# Patient Record
Sex: Male | Born: 1935 | Race: White | Hispanic: No | Marital: Married | Smoking: Former smoker
Health system: Southern US, Community
[De-identification: ages and names within clinical notes are randomized; demographics above are authoritative.]

## PROBLEM LIST (undated history)

## (undated) ENCOUNTER — Emergency Department (HOSPITAL_COMMUNITY): Disposition: A | Discharge status: Other Acute Inpatient Hospital | Payer: Self-pay

## (undated) DIAGNOSIS — I214 Non-ST elevation (NSTEMI) myocardial infarction: Secondary | ICD-10-CM

## (undated) DIAGNOSIS — I2510 Atherosclerotic heart disease of native coronary artery without angina pectoris: Secondary | ICD-10-CM

## (undated) DIAGNOSIS — I209 Angina pectoris, unspecified: Secondary | ICD-10-CM

## (undated) DIAGNOSIS — I10 Essential (primary) hypertension: Secondary | ICD-10-CM

## (undated) DIAGNOSIS — Z95820 Peripheral vascular angioplasty status with implants and grafts: Secondary | ICD-10-CM

## (undated) DIAGNOSIS — E785 Hyperlipidemia, unspecified: Secondary | ICD-10-CM

## (undated) DIAGNOSIS — K219 Gastro-esophageal reflux disease without esophagitis: Secondary | ICD-10-CM

## (undated) DIAGNOSIS — E119 Type 2 diabetes mellitus without complications: Secondary | ICD-10-CM

## (undated) DIAGNOSIS — Z8719 Personal history of other diseases of the digestive system: Secondary | ICD-10-CM

## (undated) DIAGNOSIS — M1990 Unspecified osteoarthritis, unspecified site: Secondary | ICD-10-CM

## (undated) HISTORY — PX: TONSILLECTOMY AND ADENOIDECTOMY: SUR1326

## (undated) HISTORY — PX: CERVICAL DISC SURGERY: SHX588

---

## 1989-04-04 HISTORY — PX: CERVICAL DISC SURGERY: SHX588

## 1997-10-02 HISTORY — PX: CORONARY ARTERY BYPASS GRAFT: SHX141

## 2001-01-01 ENCOUNTER — Encounter (INDEPENDENT_AMBULATORY_CARE_PROVIDER_SITE_OTHER): Payer: Self-pay | Admitting: Specialist

## 2001-01-01 ENCOUNTER — Other Ambulatory Visit: Admission: RE | Admit: 2001-01-01 | Discharge: 2001-01-01 | Payer: Self-pay | Admitting: Gastroenterology

## 2001-03-29 ENCOUNTER — Inpatient Hospital Stay (HOSPITAL_COMMUNITY): Admission: EM | Admit: 2001-03-29 | Discharge: 2001-03-31 | Payer: Self-pay | Admitting: Emergency Medicine

## 2001-03-29 ENCOUNTER — Encounter: Payer: Self-pay | Admitting: Emergency Medicine

## 2001-03-30 ENCOUNTER — Encounter: Payer: Self-pay | Admitting: Cardiovascular Disease

## 2007-03-25 DIAGNOSIS — I2510 Atherosclerotic heart disease of native coronary artery without angina pectoris: Secondary | ICD-10-CM

## 2007-03-25 HISTORY — DX: Atherosclerotic heart disease of native coronary artery without angina pectoris: I25.10

## 2009-04-18 ENCOUNTER — Encounter (INDEPENDENT_AMBULATORY_CARE_PROVIDER_SITE_OTHER): Payer: Self-pay | Admitting: *Deleted

## 2009-12-21 ENCOUNTER — Telehealth: Payer: Self-pay | Admitting: Gastroenterology

## 2009-12-25 ENCOUNTER — Encounter: Payer: Self-pay | Admitting: Gastroenterology

## 2010-01-17 ENCOUNTER — Encounter (INDEPENDENT_AMBULATORY_CARE_PROVIDER_SITE_OTHER): Payer: Self-pay | Admitting: *Deleted

## 2010-01-21 ENCOUNTER — Ambulatory Visit: Payer: Self-pay | Admitting: Gastroenterology

## 2010-01-30 ENCOUNTER — Ambulatory Visit: Payer: Self-pay | Admitting: Gastroenterology

## 2010-02-04 ENCOUNTER — Encounter: Payer: Self-pay | Admitting: Gastroenterology

## 2010-09-05 NOTE — Letter (Signed)
Summary: [facility] Instructions  [facility]  [address] [place], [place] [address]   Phone: [phone]  Fax: [phone]       Daryl Joseph    [birth year]    MRN: [id number]        Procedure Day Dorna Bloom:  Wednesday 01/30/2010     Arrival Time: 10:30 am      Procedure Time: 11:30 am     Location of Procedure:                    _x _  [facility] (4th Floor)                        PREPARATION FOR COLONOSCOPY WITH MOVIPREP   Starting 5 days prior to your procedure Friday 6/24 do not eat nuts, seeds, popcorn, corn, beans, peas,  salads, or any raw vegetables.  Do not take any fiber supplements (e.g. Metamucil, Citrucel, and Benefiber).  THE DAY BEFORE YOUR PROCEDURE         DATE: Tuesday 6/28  1.  Drink clear liquids the entire day-NO SOLID FOOD  2.  Do not drink anything colored red or purple.  Avoid juices with pulp.  No orange juice.  3.  Drink at least 64 oz. (8 glasses) of fluid/clear liquids during the day to prevent dehydration and help the prep work efficiently.  CLEAR LIQUIDS INCLUDE: Water Jello Ice Popsicles Tea (sugar ok, no milk/cream) Powdered fruit flavored drinks Coffee (sugar ok, no milk/cream) Gatorade Juice: apple, white grape, white cranberry  Lemonade Clear bullion, consomm, broth Carbonated beverages (any kind) Strained chicken noodle soup Hard Candy                             4.  In the morning, mix first dose of MoviPrep solution:    Empty 1 Pouch A and 1 Pouch B into the disposable container    Add lukewarm drinking water to the top line of the container. Mix to dissolve    Refrigerate (mixed solution should be used within 24 hrs)  5.  Begin drinking the prep at 5:00 p.m. The MoviPrep container is divided by 4 marks.   Every 15 minutes drink the solution down to the next mark (approximately 8 oz) until the full liter is complete.   6.  Follow completed prep with 16 oz of clear liquid of your choice (Nothing  red or purple).  Continue to drink clear liquids until bedtime.  7.  Before going to bed, mix second dose of MoviPrep solution:    Empty 1 Pouch A and 1 Pouch B into the disposable container    Add lukewarm drinking water to the top line of the container. Mix to dissolve    Refrigerate  THE DAY OF YOUR PROCEDURE      DATE: Wednesday 6/29  Beginning at 6:30 am (5 hours before procedure):         1. Every 15 minutes, drink the solution down to the next mark (approx 8 oz) until the full liter is complete.  2. Follow completed prep with 16 oz. of clear liquid of your choice.    3. You may drink clear liquids until 9:30 am(2 HOURS BEFORE PROCEDURE).   MEDICATION INSTRUCTIONS  Unless otherwise instructed, you should take regular prescription medications with a small sip of water   as early as possible the morning of your  procedure.  Hold your metformin the am of your procedure.         OTHER INSTRUCTIONS  You will need a responsible adult at least 75 years of age to accompany you and drive you home.   This person must remain in the waiting room during your procedure.  Wear loose fitting clothing that is easily removed.  Leave jewelry and other valuables at home.  However, you may wish to bring a book to read or  an iPod/MP3 player to listen to music as you wait for your procedure to start.  Remove all body piercing jewelry and leave at home.  Total time from sign-in until discharge is approximately 2-3 hours.  You should go home directly after your procedure and rest.  You can resume normal activities the  day after your procedure.  The day of your procedure you should not:   Drive   Make legal decisions   Operate machinery   Drink alcohol   Return to work  You will receive specific instructions about eating, activities and medications before you leave.    The above instructions have been reviewed and explained to me by   Clide Cliff,  RN_____________________    I fully understand and can verbalize these instructions _____________________________ Date _________

## 2010-09-05 NOTE — Progress Notes (Signed)
Summary: Schedule Colonoscopy  Phone Note Outgoing Call   Call placed by: Lamona Curl CMA Duncan Dull),  Dec 21, 2009 1:32 PM Call placed to: Patient Summary of Call: Patient is overdue for colonoscopy. His last colonoscopy in 05/2004 showed internal hemorrhoids, diverticulosis and adenomatous colonic polyps. I have spoken to patient's wife who has scheduled the patient for a previsit on 6/20 @ 9 am and for colonoscopy on 01/30/10 @ 11:30 am in [facility]. I have confirmed that the patient is not on any anticoagulants nor is he on any insulin. Initial call taken by: Lamona Curl CMA (AAMA),  Dec 21, 2009 1:40 PM

## 2010-09-05 NOTE — Letter (Signed)
Summary: Previsit letter  [facility]  [address] [place], [place] [address]   Phone: [phone]  Fax: [phone]       12/25/2009 MRN: [id number]  [facility] [place] [address] [place] [address] [place]  [address]  [place]  Dear Mr. Daryl Joseph,  Welcome to the Gastroenterology Division at [facility].    You are scheduled to see a nurse for your pre-procedure visit on 01/21/10 at 9:00 am on the 3rd floor at [facility], [address]  We ask that you try to arrive at our office 15 minutes prior to your appointment time to allow for check-in.  Your nurse visit will consist of discussing your medical and surgical history, your immediate family medical history, and your medications.    Please bring a complete list of all your medications or, if you prefer, bring the medication bottles and we will list them.  We will need to be aware of both prescribed and over the counter drugs.  We will need to know exact dosage information as well.  If you are on blood thinners (Coumadin, Plavix, Aggrenox, Ticlid, etc.) please call our office today/prior to your appointment, as we need to consult with your physician about holding your medication.   Please be prepared to read and sign documents such as consent forms, a financial agreement, and acknowledgement forms.  If necessary, and with your consent, a friend or relative is welcome to sit-in on the nurse visit with you.  Please bring your insurance card so that we may make a copy of it.  If your insurance requires a referral to see a specialist, please bring your referral form from your primary care physician.  No co-pay is required for this nurse visit.     If you cannot keep your appointment, please call [phone] to cancel or reschedule prior to your appointment date.  This allows [place] the opportunity to schedule an appointment for another patient in need of care.    Thank you for choosing [facility] for your medical needs.  We  appreciate the opportunity to care for you.  Please visit [place] at our website  to learn more about our practice.                     Sincerely.                                                                                                                   The Gastroenterology Division

## 2010-09-05 NOTE — Letter (Signed)
Summary: Patient Notice- Polyp Results  [facility]  [address] [place], [place] [address]   Phone: [phone]  Fax: [phone]        February 04, 2010 MRN: [id number]    [facility] [address] [place] [address] [place]  [address]    Dear Mr. Ganser,  I am pleased to inform you that the colon polyp(s) removed during your recent colonoscopy was (were) found to be benign (no cancer detected) upon pathologic examination.  I recommend you have a repeat colonoscopy examination in 5 years to look for recurrent polyps, as having colon polyps increases your risk for having recurrent polyps or even colon cancer in the future.  Should you develop new or worsening symptoms of abdominal pain, bowel habit changes or bleeding from the rectum or bowels, please schedule an evaluation with either your primary care physician or with me.  Continue treatment plan as outlined the day of your exam.  Please call us if you are having persistent problems or have questions about your condition that have not been fully answered at this time.  Sincerely,  Meryl Dare MD [facility]  This letter has been electronically signed by your physician.  Appended Document: Patient Notice- Polyp Results letter mailed

## 2010-09-05 NOTE — Procedures (Signed)
Summary: Colonoscopy  Patient: Daryl Joseph Note: All result statuses are Final unless otherwise noted.  Tests: (1) Colonoscopy (COL)   COL Colonoscopy           DONE     [facility]     [address]     [place], [place]  [address]           COLONOSCOPY PROCEDURE REPORT           PATIENT:  [name], [name]  MR#:  [id number]     BIRTHDATE:  04/27/1933, 76 yrs. old  GENDER:  male     ENDOSCOPIST:  Judie Petit T. Russella Dar, MD, [facility]           PROCEDURE DATE:  01/30/2010     PROCEDURE:  Colonoscopy with snare polypectomy     ASA CLASS:  Class II     INDICATIONS:  1) follow-up of polyp  2) surveillance and high-risk     screening, adenomatous polyp, 06/1995.     MEDICATIONS:   Fentanyl 50 mcg IV, Versed 4 mg IV     DESCRIPTION OF PROCEDURE:   After the risks benefits and     alternatives of the procedure were thoroughly explained, informed     consent was obtained.  Digital rectal exam was performed and     revealed no abnormalities.   The LB PCF-[id number] endoscope     was introduced through the anus and advanced to the cecum, which     was identified by both the appendix and ileocecal valve, without     limitations.  The quality of the prep was adequate, using     MoviPrep.  The instrument was then slowly withdrawn as the colon     was fully examined.     <<PROCEDUREIMAGES>>     FINDINGS:  Moderate diverticulosis was found ascending colon to     sigmoid colon.  A sessile polyp was found in the ascending colon.     It was 5 mm in size. Polyp was snared without cautery. Retrieval     was successful. This was otherwise a normal examination of the     colon. Retroflexed views in the rectum revealed internal     hemorrhoids, small. The time to cecum =  2  minutes. The scope was     then withdrawn (time =  9  min) from the patient and the procedure     completed.     COMPLICATIONS:  None           ENDOSCOPIC IMPRESSION:     1) Moderate diverticulosis ascending colon to sigmoid  colon     2) 5 mm sessile polyp in the ascending colon     3) Internal hemorrhoids     RECOMMENDATIONS:     1) Await pathology results     2) High fiber diet with liberal fluid intake.     3) Repeat Colonoscopy in 5 years pending pathology review           Steffen Hase T. Russella Dar, MD, [facility]           n.     eSIGNED:   Venita Lick. Jabin Tapp at 01/30/2010 12:05 PM           [place], [place], [id number]  Note: An exclamation mark (!) indicates a result that was not dispersed into the flowsheet. Document Creation Date: 01/30/2010 12:05 PM _______________________________________________________________________  (1) Order result status: Final Collection or observation date-time: 01/30/2010 12:00 Requested  date-time:  Receipt date-time:  Reported date-time:  Referring Physician:   Ordering Physician: Claudette Head [id number]) Specimen Source:  Source: Launa Grill Order Number: [id number] Lab site:   Appended Document: Colonoscopy     Procedures Next Due Date:    Colonoscopy: 01/2015

## 2010-09-05 NOTE — Miscellaneous (Signed)
Summary: recall colonoscopy/dn  Clinical Lists Changes  Medications: Added new medication of MOVIPREP 100 GM  SOLR (PEG-KCL-NACL-NASULF-NA ASC-C) As directed - Signed Rx of MOVIPREP 100 GM  SOLR (PEG-KCL-NACL-NASULF-NA ASC-C) As directed;  #1 x 0;  Signed;  Entered by: Clide Cliff RN;  Authorized by: Meryl Dare MD [facility];  Method used: Electronically to [facility] [phone]*, [address] [place], [place]  [address], Ph: [phone], Fax: [phone] Observations: Added new observation of ALLERGY REV: Done (01/21/2010 8:39)    Prescriptions: MOVIPREP 100 GM  SOLR (PEG-KCL-NACL-NASULF-NA ASC-C) As directed  #1 x 0   Entered by:   Clide Cliff RN   Authorized by:   Meryl Dare MD [facility]   Signed by:   Clide Cliff RN on 01/21/2010   Method used:   Electronically to        [facility] [phone]* (retail)       [address]       [place], [place]  [address]       Ph: [phone]       Fax: [phone]   RxID:   [id number]

## 2010-10-20 LAB — GLUCOSE, CAPILLARY
Glucose-Capillary: 125 mg/dL — ABNORMAL HIGH (ref 70–99)
Glucose-Capillary: 162 mg/dL — ABNORMAL HIGH (ref 70–99)

## 2010-11-05 HISTORY — PX: CARDIOVASCULAR STRESS TEST: SHX262

## 2010-12-20 NOTE — Cardiovascular Report (Signed)
[address] [facility]  Patient:    Daryl Joseph, Daryl Joseph Visit Number: [id number] MRN: [id number]          Service Type: MED Location: [phone] Attending Physician:  Virgina Evener Dictated by:   Lennette Bihari, M.D. Proc. Date: 03/30/01 Adm. Date:  03/29/2001                          Cardiac Catheterization  PROCEDURE: 1. Left heart catheterization done via the right femoral artery. 2. Biplane cine left ventriculography. 3. Cine coronary angiography. 4. Selective angiography of the left internal mammary artery. 5. Selective angiography of saphenous vein grafts.  CARDIOLOGIST:  Lennette Bihari, M.D.  INDICATIONS:  Mr. Embry Huss is a very pleasant 77 year old gentleman who underwent revascularization surgery on October 03, 1997, by Dr. Tyrone Sage with a LIMA to the LAD, vein to the diagonal, vein sequentially to the first marginal and distal circumflex, and vein to the distal right coronary artery.  The patient was admitted to [facility] last evening by Dr. Aleen Campi.  He was concerned about the possibility of unstable angina. The patient is now referred for definitive catheterization  HEMODYNAMIC DATA: 1. Central aortic pressure was 128/66. 2. Left ventricular pressure 128/19.  ANGIOGRAPHIC DATA:  Left main coronary artery was angiographically normal and trifurcated into an LAD, diminutive intermediate, and moderate size left circumflex coronary The LAD was diffusely disease proximally with 90% stenosis before a first septal and then was totally occluded.  The intermediate vessel was a diminutive vessel.  The circumflex vessel had 80% stenosis at the origin of the first marginal vessell and diffuse disease in the distal marginal vesse.  The right coronary artery had diffuse 50 to 60% proximal stenosis and then had 90 to 95% diffuse stenosis in the region of the crux before the PDA takeoff.  The sequential saphenous vein graft supplying  the OM-1 and distal circumflex was widely patent.  There was no disease beyond the graft insertions.  The saphenous vein graft supplying the diagonal vessel was widely patent, and the distal diagonal was small caliber but free of disease.  The saphenous vein graft supplying the right coronary artery was a large vein which supplied the PDA.  There was excellent filling of the distal right coronary artery via this collateral including also the posterolateral branch.  The left internal mammary artery was widely patent and supplied the left anterior descending artery which was free of disease beyond the graft insertion.  Biplane cine left ventriculography revealed preserved global LV contractility. There was a suggestion of minimal subtle anterolateral hypocontractility.  Distal aortography did not reveal any significant aortoiliac disease.  IMPRESSION: 1. Normal left ventricular function with minimal anterolateral    hypocontractility. 2. Significant native coronary obstructive disease with occlusion of the    proximal left anterior descending artery, 70 to 80% stenosis in the    circumflex marginal vessel, diffuse disease in the distal circumflex, as    well as 50 to 60% proximal right coronary artery and 90 to 95% stenosis    in the region of the crux. 3. Patent left internal mammary artery to the left anterior descending artery. 4. Patent saphenous vein graft to the diagonal. 5. Patent sequential vein graft to the obtuse marginal #1 and distal    circumflex. 6. Patent saphenous vein graft to the right coronary artery.  RECOMMENDATION:  Medical therapy. Dictated by:   Lennette Bihari, M.D. Attending  Physician:  Virgina Evener DD:  03/30/01 TD:  03/31/01 Job: [id number]

## 2010-12-20 NOTE — Discharge Summary (Signed)
[place]  Patient:    Daryl Joseph Visit Number: [id number] MRN: [id number]          Service Type: MED Location: [phone] Attending Physician:  Virgina Evener Dictated by:   Raymon Mutton, P.A. Adm. Date:  03/29/2001 Disc. Date: 03/31/01                             Discharge Summary  DISCHARGE DIAGNOSES: 1. Chest pain, resolved, etiology undetermined and probably related to    gastroesophageal reflux disease. 2. Coronary artery disease, status post coronary artery bypass graft with last    catheterization on March 30, 2001. 3. Systemic hypertension. 4. Hyperlipidemia. 5. Gastroesophageal reflux disease.  HISTORY OF PRESENT ILLNESS:  This is a 77 year old, Caucasian male patient of Dr. Tresa Endo who has been feeling weak and tired during the week prior to his emergency room presentation.  He presented to the emergency room with complaint of left-sided anterior chest pain radiating to the left arm and left side of his neck and shoulder.  This pain would wake him up from sleep for preceding two to three days.  At todays presentation, he experienced something with shortness of breath that started at lunch around 2 p.m.  He took regular dose of aspirin and one nitroglycerin with some relief, but chest tightness still persisted.  He experienced another bout of pain around a couple of hours after that which also was accompanied by heartburn.  The patient took two Zantac with some relief.  Since the pain persisted, he decided to come to the emergency room and after receiving IV nitroglycerin, he became pain free.  PAST MEDICAL HISTORY: 1. Coronary artery disease, status post CABG x 4 in 1999, with LIMA to the    LAD, SVG to diagonal, SVG to obtuse marginal and circumflex with SVG to the    right coronary artery. 2. Hyperlipidemia. 3. Gastroesophageal reflux disease. 4. Hypertension. 5. History of benign polyps.  His last  colonoscopy was approximately two to    three months ago which yielded normal result. 6. Renal cysts. 7. Diverticulosis. 8. 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  MEDICATIONS: 1. Niacin 2000 mg, discontinued to GI upset. 2. Zantac 75 mg b.i.d. p.r.n. 3. Toprol XL 75 mg q.d. 4. Folic acid 1 mg q.d. 5. Vitamin E 400 IU q.d. 6. Vitamin C 500 mg. 7. Aspirin 325 mg q.d. 8. Nitroglycerin sublingual p.r.n.  ALLERGIES:  No known drug allergies.  PROCEDURE:  On March 30, 2001, the patient had a cardiac catheterization performed by Dr. Tresa Endo.  This procedure showed that all his grafts were patent and had diffuse coronary artery disease of his native coronaries with 90-95% stenosis of the distal part of the right coronary artery on proximal left anterior descending.  At this time, the patient did not require any interventions.  The plan was to manage him by medical therapy.  HOSPITAL COURSE:  In the emergency room, the patient was started on IV nitroglycerin with relief of pain.  His cardiac enzymes were negative x 3. Hemoglobin was 15.6 with hematocrit 44.7 and normal white count.  Potassium was 4.8 with creatinine 0.9.  Nonfasting glucose was 110.  Physical exam revealed a well-nourished, well-developed, 77 year old, Caucasian gentleman in no acute distress.  His blood pressure was 148/94, pulse was 52.  He was afebrile with respiratory rate 20.  Lungs  were clear to auscultation bilaterally.  Neck was free from JVD or carotid bruits.  Heart revealed regular rate and rhythm with no murmurs, rubs or gallops.  Abdomen was soft, nontender, nondistended.  There were slight diminished bowel sounds. Extremities did not reveal any edema and peripheral pulses were palpable with +1 bilaterally in dorsalis pedis.  The patient tolerated cardiac catheterization well and was transferred to the unit in stable condition.  The next morning during morning  rounds, his blood pressure was accelerated at 172/90.  After Dr. Tresa Endo assessed the patient, he started him on Altace 200.5 mg which will be beneficial to his blood pressure and coronary arteries according to the trial data.  We will gradually titrate the dose up as tolerated to get the target at maximum dose of 10 mg.  Also, the patient was started on proton inhibitors to control symptoms of gastroesophageal reflux disease.  Aspirin was decreased to 81 mg.  The patient was discharged home in stable condition.  Groin was stable with no signs of bruising, bleeding or swelling.  DISCHARGE MEDICATIONS: 1. Coated aspirin 81 mg q.d. 2. Altace 2.5 mg q.d. 3. Toprol 75 mg q.d. 4. Folic acid 1 mg q.d. 5. Niacin 2000 mg q.h.s. 6. Protonix 40 mg q.d.  ACTIVITY:  He was instructed to avoid any driving, pushing, heavy lifting or sexual activity with any other strenuous activity for three days after the catheterization.  DIET:  Low fat, low cholesterol, low salt diet.  WOUND CARE:  The patient was told to shower and gently wash the groin area with mild soap and water, pat dry.  FOLLOWUP:  Appointment was scheduled with Dr. Tresa Endo on September 12, at 9:15 a.m.  The patient was also told to give our practice a call should he develop any problems like increased swelling, oozing or bleeding from the groin site or any other concerns. Dictated by:   Raymon Mutton, P.A. Attending Physician:  Virgina Evener DD:  03/31/01 TD:  03/31/01 Job: [id number]

## 2011-09-23 HISTORY — PX: DOPPLER ECHOCARDIOGRAPHY: SHX263

## 2012-06-18 ENCOUNTER — Inpatient Hospital Stay (HOSPITAL_COMMUNITY)
Admission: AD | Admit: 2012-06-18 | Discharge: 2012-06-22 | DRG: 281 | Disposition: A | Discharge status: Home / Self Care | Payer: Medicare Other | Source: Other Acute Inpatient Hospital | Attending: Cardiology | Admitting: Cardiology

## 2012-06-18 ENCOUNTER — Encounter (HOSPITAL_COMMUNITY): Payer: Self-pay | Admitting: Internal Medicine

## 2012-06-18 DIAGNOSIS — I214 Non-ST elevation (NSTEMI) myocardial infarction: Secondary | ICD-10-CM

## 2012-06-18 DIAGNOSIS — Z800 Family history of malignant neoplasm of digestive organs: Secondary | ICD-10-CM

## 2012-06-18 DIAGNOSIS — Z87891 Personal history of nicotine dependence: Secondary | ICD-10-CM

## 2012-06-18 DIAGNOSIS — Z8249 Family history of ischemic heart disease and other diseases of the circulatory system: Secondary | ICD-10-CM

## 2012-06-18 DIAGNOSIS — E785 Hyperlipidemia, unspecified: Secondary | ICD-10-CM | POA: Diagnosis present

## 2012-06-18 DIAGNOSIS — E119 Type 2 diabetes mellitus without complications: Secondary | ICD-10-CM | POA: Diagnosis present

## 2012-06-18 DIAGNOSIS — Z888 Allergy status to other drugs, medicaments and biological substances status: Secondary | ICD-10-CM

## 2012-06-18 DIAGNOSIS — N400 Enlarged prostate without lower urinary tract symptoms: Secondary | ICD-10-CM | POA: Diagnosis present

## 2012-06-18 DIAGNOSIS — I2582 Chronic total occlusion of coronary artery: Secondary | ICD-10-CM | POA: Diagnosis present

## 2012-06-18 DIAGNOSIS — Z8051 Family history of malignant neoplasm of kidney: Secondary | ICD-10-CM

## 2012-06-18 DIAGNOSIS — Z808 Family history of malignant neoplasm of other organs or systems: Secondary | ICD-10-CM

## 2012-06-18 DIAGNOSIS — R079 Chest pain, unspecified: Secondary | ICD-10-CM

## 2012-06-18 DIAGNOSIS — I10 Essential (primary) hypertension: Secondary | ICD-10-CM | POA: Diagnosis present

## 2012-06-18 DIAGNOSIS — Z833 Family history of diabetes mellitus: Secondary | ICD-10-CM

## 2012-06-18 DIAGNOSIS — I2510 Atherosclerotic heart disease of native coronary artery without angina pectoris: Secondary | ICD-10-CM | POA: Diagnosis present

## 2012-06-18 DIAGNOSIS — I25810 Atherosclerosis of coronary artery bypass graft(s) without angina pectoris: Secondary | ICD-10-CM | POA: Diagnosis present

## 2012-06-18 HISTORY — DX: Non-ST elevation (NSTEMI) myocardial infarction: I21.4

## 2012-06-18 HISTORY — DX: Type 2 diabetes mellitus without complications: E11.9

## 2012-06-18 HISTORY — DX: Hyperlipidemia, unspecified: E78.5

## 2012-06-18 HISTORY — DX: Essential (primary) hypertension: I10

## 2012-06-18 HISTORY — DX: Atherosclerotic heart disease of native coronary artery without angina pectoris: I25.10

## 2012-06-18 LAB — CK TOTAL AND CKMB (NOT AT ARMC): Total CK: 94 U/L (ref 7–232)

## 2012-06-18 LAB — TROPONIN I: Troponin I: 0.42 ng/mL (ref <0.30)

## 2012-06-18 LAB — MRSA PCR SCREENING: MRSA by PCR: NEGATIVE

## 2012-06-18 MED ORDER — AMLODIPINE BESYLATE 10 MG PO TABS
10.0000 mg | ORAL_TABLET | Freq: Every day | ORAL | Status: DC
  Administered 2012-06-19 – 2012-06-22 (×4): 10 mg via ORAL
  Filled 2012-06-18 (×4): qty 1

## 2012-06-18 MED ORDER — SODIUM CHLORIDE 0.9 % IV SOLN
INTRAVENOUS | Status: DC
  Administered 2012-06-18: 21:00:00 via INTRAVENOUS
  Administered 2012-06-20: 10 mL via INTRAVENOUS

## 2012-06-18 MED ORDER — HEPARIN BOLUS VIA INFUSION
4000.0000 [IU] | Freq: Once | INTRAVENOUS | Status: DC
  Filled 2012-06-18: qty 4000

## 2012-06-18 MED ORDER — NITROGLYCERIN IN D5W 200-5 MCG/ML-% IV SOLN
5.0000 ug/min | INTRAVENOUS | Status: DC
  Administered 2012-06-18: 5 ug/min via INTRAVENOUS
  Administered 2012-06-19: 20 ug/min via INTRAVENOUS
  Administered 2012-06-19: 35 ug/min via INTRAVENOUS
  Administered 2012-06-20 (×2): 25 ug/min via INTRAVENOUS
  Filled 2012-06-18 (×2): qty 250

## 2012-06-18 MED ORDER — HYDROMORPHONE HCL PF 1 MG/ML IJ SOLN: 0.5000 mg | INTRAMUSCULAR | Status: DC | PRN

## 2012-06-18 MED ORDER — HEPARIN (PORCINE) IN NACL 100-0.45 UNIT/ML-% IJ SOLN
1000.0000 [IU]/h | INTRAMUSCULAR | Status: DC
  Administered 2012-06-18 – 2012-06-19 (×2): 1000 [IU]/h via INTRAVENOUS
  Filled 2012-06-18 (×4): qty 250

## 2012-06-18 MED ORDER — ZOLPIDEM TARTRATE 5 MG PO TABS: 5.0000 mg | ORAL_TABLET | Freq: Every evening | ORAL | Status: DC | PRN

## 2012-06-18 MED ORDER — OXYCODONE HCL 5 MG PO TABS: 5.0000 mg | ORAL_TABLET | ORAL | Status: DC | PRN

## 2012-06-18 MED ORDER — ASPIRIN EC 325 MG PO TBEC
325.0000 mg | DELAYED_RELEASE_TABLET | Freq: Every day | ORAL | Status: DC
  Administered 2012-06-19 – 2012-06-22 (×3): 325 mg via ORAL
  Filled 2012-06-18 (×5): qty 1

## 2012-06-18 MED ORDER — HEPARIN BOLUS VIA INFUSION
2000.0000 [IU] | Freq: Once | INTRAVENOUS | Status: AC
  Administered 2012-06-18: 2000 [IU] via INTRAVENOUS
  Filled 2012-06-18: qty 2000

## 2012-06-18 MED ORDER — EZETIMIBE 10 MG PO TABS
10.0000 mg | ORAL_TABLET | Freq: Every day | ORAL | Status: DC
  Administered 2012-06-18 – 2012-06-21 (×4): 10 mg via ORAL
  Filled 2012-06-18 (×5): qty 1

## 2012-06-18 MED ORDER — OCUVITE-LUTEIN PO CAPS
1.0000 | ORAL_CAPSULE | Freq: Every day | ORAL | Status: DC
  Administered 2012-06-18 – 2012-06-22 (×5): 1 via ORAL
  Filled 2012-06-18 (×5): qty 1

## 2012-06-18 MED ORDER — ACETAMINOPHEN 650 MG RE SUPP: 650.0000 mg | Freq: Four times a day (QID) | RECTAL | Status: DC | PRN

## 2012-06-18 MED ORDER — ACETAMINOPHEN 325 MG PO TABS
650.0000 mg | ORAL_TABLET | Freq: Four times a day (QID) | ORAL | Status: DC | PRN
  Administered 2012-06-19: 650 mg via ORAL
  Filled 2012-06-18: qty 2

## 2012-06-18 MED ORDER — EYE VITAMINS PO CAPS: 1.0000 | ORAL_CAPSULE | ORAL | Status: DC

## 2012-06-18 MED ORDER — FENOFIBRATE 54 MG PO TABS
54.0000 mg | ORAL_TABLET | Freq: Every day | ORAL | Status: DC
  Administered 2012-06-19 – 2012-06-22 (×4): 54 mg via ORAL
  Filled 2012-06-18 (×4): qty 1

## 2012-06-18 MED ORDER — INSULIN ASPART 100 UNIT/ML ~~LOC~~ SOLN
0.0000 [IU] | Freq: Three times a day (TID) | SUBCUTANEOUS | Status: DC
  Administered 2012-06-19: 1 [IU] via SUBCUTANEOUS
  Administered 2012-06-19 – 2012-06-20 (×5): 2 [IU] via SUBCUTANEOUS
  Administered 2012-06-21: 3 [IU] via SUBCUTANEOUS
  Administered 2012-06-21 – 2012-06-22 (×2): 1 [IU] via SUBCUTANEOUS
  Administered 2012-06-22: 2 [IU] via SUBCUTANEOUS

## 2012-06-18 MED ORDER — INSULIN ASPART 100 UNIT/ML ~~LOC~~ SOLN: 0.0000 [IU] | Freq: Every day | SUBCUTANEOUS | Status: DC

## 2012-06-18 MED ORDER — BENAZEPRIL HCL 20 MG PO TABS
20.0000 mg | ORAL_TABLET | Freq: Every day | ORAL | Status: DC
  Administered 2012-06-19 – 2012-06-22 (×3): 20 mg via ORAL
  Filled 2012-06-18 (×4): qty 1

## 2012-06-18 MED ORDER — ONDANSETRON HCL 4 MG PO TABS: 4.0000 mg | ORAL_TABLET | Freq: Four times a day (QID) | ORAL | Status: DC | PRN

## 2012-06-18 MED ORDER — ALUM & MAG HYDROXIDE-SIMETH 200-200-20 MG/5ML PO SUSP
30.0000 mL | Freq: Four times a day (QID) | ORAL | Status: DC | PRN
  Administered 2012-06-20: 30 mL via ORAL
  Filled 2012-06-18: qty 30

## 2012-06-18 MED ORDER — PANTOPRAZOLE SODIUM 40 MG IV SOLR
40.0000 mg | INTRAVENOUS | Status: DC
Start: 2012-06-18 — End: 2012-06-20
  Administered 2012-06-18 – 2012-06-19 (×2): 40 mg via INTRAVENOUS
  Filled 2012-06-18 (×3): qty 40

## 2012-06-18 MED ORDER — METOPROLOL TARTRATE 50 MG PO TABS
50.0000 mg | ORAL_TABLET | Freq: Two times a day (BID) | ORAL | Status: DC
  Administered 2012-06-18 – 2012-06-22 (×8): 50 mg via ORAL
  Filled 2012-06-18 (×9): qty 1

## 2012-06-18 MED ORDER — ONDANSETRON HCL 4 MG/2ML IJ SOLN: 4.0000 mg | Freq: Four times a day (QID) | INTRAMUSCULAR | Status: DC | PRN

## 2012-06-18 NOTE — H&P (Addendum)
[facility] History and Physical  Daryl MCMAHILL Joseph:[id number] DOB: 18-May-1933 DOA: 06/18/2012  Referring physician: EDP PCP: No primary provider on file.  Specialists: [facility] [place]  Chief Complaint: Chest Pain  HPI: Daryl Joseph is a 78 y.o. male with a history of CAD who initially presented to the [facility] ED with complaints of Severe Chest Pain with exertion that started at 1100 AM.  HE rated the pain as a 7-8/10 and described the pain as substernal heaviness and dull pain that radiated into the neck and down the left arm.  HE had associated SOB, and nausea but denied diaphoresis.  He reported that the pain occurred while he was raking leaves and got better when he rested but returned as soon as he exerted himself.  He reported to the ED at 1PM.   His pain did not resolve until he had been given IV Morphine X 1 dose.  At the ED he was evaluated and had a elevated troponin of 0.122 (on a scale of 0.0-0.056), and an EKG which revealed ischemic changes in the anterior leads.  The [facility] hospital spoke to [facility] Cardiology Dr. Herbie Baltimore and transferred the patient to [facility] for direct admission.    Review of Systems: The patient denies anorexia, fever, weight loss, vision loss, decreased hearing, hoarseness, syncope, dyspnea on exertion, peripheral edema, balance deficits, hemoptysis, abdominal pain, melena, hematochezia, severe indigestion/heartburn, hematuria, incontinence, genital sores, muscle weakness, suspicious skin lesions, transient blindness, difficulty walking, depression, unusual weight change, abnormal bleeding, enlarged lymph nodes, angioedema, and breast masses.    Past Medical History  Diagnosis Date  . CAD (coronary artery disease)   . Hypertension   . DM type 2 (diabetes mellitus, type 2)   . Hyperlipidemia    Past Surgical History  Procedure Date  . Coronary artery bypass graft   . Cervical disc surgery   . Hernia repair     Medications:  HOME  MEDS: Prior to Admission medications   Medication Sig Start Date End Date Taking? Authorizing Provider  amLODipine (NORVASC) 10 MG tablet Take 10 mg by mouth daily.   Yes Historical Provider, MD  aspirin 81 MG tablet Take 81 mg by mouth daily.   Yes Historical Provider, MD  benazepril (LOTENSIN) 20 MG tablet Take 20 mg by mouth daily.   Yes Historical Provider, MD  ezetimibe (ZETIA) 10 MG tablet Take 10 mg by mouth at bedtime.   Yes Historical Provider, MD  fenofibrate (TRICOR) 48 MG tablet Take 48 mg by mouth daily.   Yes Historical Provider, MD  folic acid (FOLVITE) 1 MG tablet Take 1 mg by mouth daily.   Yes Historical Provider, MD  metFORMIN (GLUCOPHAGE) 1000 MG tablet Take 1,000 mg by mouth 4 (four) times daily.   Yes Historical Provider, MD  metoprolol (LOPRESSOR) 50 MG tablet Take 50 mg by mouth 2 (two) times daily.   Yes Historical Provider, MD  Multiple Vitamins-Minerals (EYE VITAMINS PO) Take 1 tablet by mouth every evening.   Yes Historical Provider, MD  omeprazole (PRILOSEC) 20 MG capsule Take 20 mg by mouth daily.   Yes Historical Provider, MD    No Known Allergies   Social History: Married, and Able to perform all of his ADLs, He does not have a smoking history on file. He does not have any smokeless tobacco history on file. His alcohol and drug histories not on file.    Family History  Problem Relation Age of Onset  . Coronary artery disease  Father   . Coronary artery disease Brother   . Coronary artery disease Paternal Grandfather   . Hypertension Father   . Hypertension Mother   . Diabetes Brother   . Cancer - Colon Father   . Cancer - Other Brother     Melanoma  . Cancer - Other Brother     Renal Cell       Physical Exam:  GEN:  Pleasant 78 year old well nourished and well developed elderly Caucasian male examined  and in no acute distress; cooperative with exam Filed Vitals:   06/18/12 1845 06/18/12 1959  BP: 153/73   Pulse: 71   Temp: 98.2 F (36.8 C)  97.4 F (36.3 C)  TempSrc: Oral   Resp: 15   SpO2: 100%    Blood pressure 153/73, pulse 71, temperature 97.4 F (36.3 C), temperature source Oral, resp. rate 15, SpO2 100.00%. PSYCH: He is alert and oriented x4; does not appear anxious does not appear depressed; affect is normal HEENT: Normocephalic and Atraumatic, Mucous membranes pink; PERRLA; EOM intact; Fundi:  Benign;  No scleral icterus, Nares: Patent, Oropharynx: Clear,  Fair Dentition, Neck:  FROM, no cervical lymphadenopathy nor thyromegaly or carotid bruit; no JVD; Breasts:: Not examined CHEST WALL: No tenderness CHEST: Normal respiration, clear to auscultation bilaterally HEART: Regular rate and rhythm; no murmurs rubs or gallops BACK: No kyphosis or scoliosis; no CVA tenderness ABDOMEN: Positive Bowel Sounds, soft non-tender; no masses, no organomegaly, no pannus; no intertriginous candida. Rectal Exam: Not done EXTREMITIES: No bone or joint deformity; age-appropriate arthropathy of the hands and knees; no cyanosis, clubbing or edema; no ulcerations. Genitalia: not examined PULSES: 2+ and symmetric SKIN: Normal hydration no rash or ulceration CNS: Cranial nerves 2-12 grossly intact no focal neurologic deficit    Labs on Admission:  Basic Metabolic Panel: No results found for this basename: NA:5,K:5,CL:5,CO2:5,GLUCOSE:5,BUN:5,CREATININE:5,CALCIUM:5,MG:5,PHOS:5 in the last 168 hours Liver Function Tests: No results found for this basename: AST:5,ALT:5,ALKPHOS:5,BILITOT:5,PROT:5,ALBUMIN:5 in the last 168 hours No results found for this basename: LIPASE:5,AMYLASE:5 in the last 168 hours No results found for this basename: AMMONIA:5 in the last 168 hours CBC: No results found for this basename: WBC:5,NEUTROABS:5,HGB:5,HCT:5,MCV:5,PLT:5 in the last 168 hours Cardiac Enzymes: No results found for this basename: CKTOTAL:5,CKMB:5,CKMBINDEX:5,TROPONINI:5 in the last 168 hours  BNP (last 3 results) No results found for this  basename: PROBNP:3 in the last 8760 hours CBG: No results found for this basename: GLUCAP:5 in the last 168 hours  Radiological Exams on Admission: No results found.  EKG: Independently reviewed. Normal sinus Rhythm with a 1st Degree AVB, and Ischemic changes in Anterior leads  Assessment: Principal Problem:  *Chest pain Active Problems:  CAD (coronary artery disease)  Hypertension  Diabetes mellitus  Hyperlipidemia    Plan:    Admitted to Stepdown Bed by [facility] Cardiology IV NTG Drip IV Heparin Drip ASA, O2 Cycle Cardiac Enzymes Reconcile Home Medications CHO Modified medium Diet SSI Coverage PRN    Code Status:  FULL CODE Family Communication:   Mrs. Goyal (Wife) at Bedside Disposition Plan:  Return to Home  Time spent: 50 Minutes  Ron Parker [facility] Pager [phone]  If 7PM-7AM, please contact night-coverage [email or web address] Password [facility] 06/18/2012, 8:25 PM    Addendum:     Cardiac Enzymes repeated,  +troponin= 0.42,  CK =94, and CKMB= 5.0    Patient remains Pain free, and is on an IV NTG drip and IV Heparin drip.     Dx:  NSTEMI

## 2012-06-18 NOTE — Progress Notes (Addendum)
ANTICOAGULATION CONSULT NOTE - Initial Consult  Pharmacy Consult for Heparin Indication: chest pain/ACS  No Known Allergies  Patient Measurements:   Heparin Dosing Weight: 70.5 kg  Vital Signs: Temp: 97.4 F (36.3 C) (11/15 1959) Temp src: Oral (11/15 1845) BP: 153/73 mmHg (11/15 1845) Pulse Rate: 71  (11/15 1845)  Labs: No results found for this basename: HGB:2,HCT:3,PLT:3,APTT:3,LABPROT:3,INR:3,HEPARINUNFRC:3,CREATININE:3,CKTOTAL:3,CKMB:3,TROPONINI:3 in the last 72 hours  CrCl is unknown because no creatinine reading has been taken and the patient has no height on file.   Medical History: Past Medical History  Diagnosis Date  . CAD (coronary artery disease)   . Hypertension   . DM type 2 (diabetes mellitus, type 2)   . Hyperlipidemia     Medications:  Prescriptions prior to admission  Medication Sig Dispense Refill  . amLODipine (NORVASC) 10 MG tablet Take 10 mg by mouth daily.      Marland Kitchen aspirin 81 MG tablet Take 81 mg by mouth daily.      . benazepril (LOTENSIN) 20 MG tablet Take 20 mg by mouth daily.      Marland Kitchen ezetimibe (ZETIA) 10 MG tablet Take 10 mg by mouth at bedtime.      . fenofibrate (TRICOR) 48 MG tablet Take 48 mg by mouth daily.      . folic acid (FOLVITE) 1 MG tablet Take 1 mg by mouth daily.      . metFORMIN (GLUCOPHAGE) 1000 MG tablet Take 1,000 mg by mouth 4 (four) times daily.      . metoprolol (LOPRESSOR) 50 MG tablet Take 50 mg by mouth 2 (two) times daily.      . Multiple Vitamins-Minerals (EYE VITAMINS PO) Take 1 tablet by mouth every evening.      Marland Kitchen omeprazole (PRILOSEC) 20 MG capsule Take 20 mg by mouth daily.        Assessment: 78 yo male with known hx CAD, CABG admitted with chest pain, to resume anticoagulation with IV heparin.  According to patient and records, IV heparin was started at [facility], but was turned off upon arrival to [facility] ~ 1900 pm.  He received 4380 unit bolus at 1608 PM, and then drip was started at 1609.  Unclear exactly  what time this was turned off.  Spoke to patient, no chronic anticoagulation. Has had trouble with bleeding polyps in the distant past, but no issues in recent years.  Baseline labs from [facility] - H/H 14/42.6; Pltc 179.  Scr 1.1  Goal of Therapy:  Heparin level 0.3-0.7 units/ml Monitor platelets by anticoagulation protocol: Yes   Plan:  1. Will restart heparin with small bolus of 2000 units x 1, then start gtt at 1000 units/hr. 2. Check heparin level 6 hrs after drip starts. 3. Daily heparin level and CBC. 4. F/U plans.  Cheyenne Schumm, Gwenlyn Found 06/18/2012,8:20 PM

## 2012-06-19 ENCOUNTER — Observation Stay (HOSPITAL_COMMUNITY): Payer: Medicare Other

## 2012-06-19 ENCOUNTER — Encounter (HOSPITAL_COMMUNITY): Payer: Self-pay | Admitting: Cardiology

## 2012-06-19 DIAGNOSIS — N400 Enlarged prostate without lower urinary tract symptoms: Secondary | ICD-10-CM | POA: Diagnosis present

## 2012-06-19 DIAGNOSIS — R9389 Abnormal findings on diagnostic imaging of other specified body structures: Secondary | ICD-10-CM | POA: Insufficient documentation

## 2012-06-19 LAB — URINALYSIS, ROUTINE W REFLEX MICROSCOPIC
Bilirubin Urine: NEGATIVE
Glucose, UA: 100 mg/dL — AB
Hgb urine dipstick: NEGATIVE
Ketones, ur: NEGATIVE mg/dL
Leukocytes, UA: NEGATIVE
Nitrite: NEGATIVE
Protein, ur: NEGATIVE mg/dL
Specific Gravity, Urine: 1.008 (ref 1.005–1.030)
Urobilinogen, UA: 0.2 mg/dL (ref 0.0–1.0)
pH: 7 (ref 5.0–8.0)

## 2012-06-19 LAB — GLUCOSE, CAPILLARY
Glucose-Capillary: 156 mg/dL — ABNORMAL HIGH (ref 70–99)
Glucose-Capillary: 146 mg/dL — ABNORMAL HIGH (ref 70–99)
Glucose-Capillary: 155 mg/dL — ABNORMAL HIGH (ref 70–99)

## 2012-06-19 LAB — TROPONIN I
Troponin I: 0.56 ng/mL (ref <0.30)
Troponin I: 0.34 ng/mL (ref <0.30)

## 2012-06-19 LAB — CK TOTAL AND CKMB (NOT AT ARMC)
CK, MB: 4.7 ng/mL — ABNORMAL HIGH (ref 0.3–4.0)
CK, MB: 3.6 ng/mL (ref 0.3–4.0)
Relative Index: INVALID (ref 0.0–2.5)
Relative Index: INVALID (ref 0.0–2.5)
Total CK: 82 U/L (ref 7–232)

## 2012-06-19 LAB — BASIC METABOLIC PANEL
BUN: 14 mg/dL (ref 6–23)
CO2: 26 mEq/L (ref 19–32)
Calcium: 9.4 mg/dL (ref 8.4–10.5)
Creatinine, Ser: 0.83 mg/dL (ref 0.50–1.35)
GFR calc Af Amer: >90 mL/min (ref >90)

## 2012-06-19 LAB — CBC
HCT: 41.1 % (ref 39.0–52.0)
MCH: 30.5 pg (ref 26.0–34.0)
MCV: 86.3 fL (ref 78.0–100.0)
Platelets: 179 10*3/uL (ref 150–400)
RDW: 13.2 % (ref 11.5–15.5)

## 2012-06-19 LAB — LIPID PANEL
HDL: 62 mg/dL (ref >39)
LDL Cholesterol: 115 mg/dL — ABNORMAL HIGH (ref 0–99)
Triglycerides: 153 mg/dL — ABNORMAL HIGH (ref <150)
VLDL: 31 mg/dL (ref 0–40)

## 2012-06-19 LAB — HEPARIN LEVEL (UNFRACTIONATED)
Heparin Unfractionated: 0.41 IU/mL (ref 0.30–0.70)
Heparin Unfractionated: 0.58 IU/mL (ref 0.30–0.70)

## 2012-06-19 LAB — PRO B NATRIURETIC PEPTIDE: Pro B Natriuretic peptide (BNP): 422.5 pg/mL (ref 0–450)

## 2012-06-19 LAB — PSA: PSA: 2.1 ng/mL (ref <=4.00)

## 2012-06-19 MED ORDER — MORPHINE SULFATE 2 MG/ML IJ SOLN
2.0000 mg | INTRAMUSCULAR | Status: DC | PRN
  Administered 2012-06-19 – 2012-06-20 (×2): 2 mg via INTRAVENOUS
  Filled 2012-06-19 (×3): qty 1

## 2012-06-19 NOTE — Progress Notes (Signed)
ANTICOAGULATION CONSULT NOTE - Follow Up Consult  Pharmacy Consult for Heparin Indication: chest pain/ACS  Allergies  Allergen Reactions  . Statins     Myositis with statins    Patient Measurements: Height: 5\' 10"  (177.8 cm) Weight: 153 lb 7 oz (69.6 kg) (standing scale) IBW/kg (Calculated) : 73  Heparin Dosing Weight: 70.5 kg  Vital Signs: Temp: 98.3 F (36.8 C) (11/16 0803) Temp src: Oral (11/16 0803) BP: 116/57 mmHg (11/16 0500) Pulse Rate: 60  (11/16 0500)  Labs:  Basename 06/19/12 0931 06/19/12 0927 06/19/12 0114 06/19/12 0105 06/18/12 2049  HGB -- -- -- 14.5 --  HCT -- -- -- 41.1 --  PLT -- -- -- 179 --  APTT -- -- -- -- --  LABPROT -- -- -- -- --  INR -- -- -- -- --  HEPARINUNFRC -- 0.58 -- 0.41 --  CREATININE -- -- -- 0.83 --  CKTOTAL 69 -- 82 -- 94  CKMB 3.6 -- 4.7* -- 5.0*  TROPONINI 0.34* -- 0.56* -- 0.42*    Estimated Creatinine Clearance: 73.4 ml/min (by C-G formula based on Cr of 0.83).   Medications:  Scheduled:    . amLODipine  10 mg Oral Daily  . aspirin EC  325 mg Oral Daily  . benazepril  20 mg Oral Daily  . ezetimibe  10 mg Oral QHS  . fenofibrate  54 mg Oral Daily  . [COMPLETED] heparin  2,000 Units Intravenous Once  . insulin aspart  0-5 Units Subcutaneous QHS  . insulin aspart  0-9 Units Subcutaneous TID WC  . metoprolol  50 mg Oral BID  . multivitamin-lutein  1 capsule Oral Daily  . pantoprazole (PROTONIX) IV  40 mg Intravenous Q24H  . [DISCONTINUED] Eye Vitamins  1 capsule Oral 1 day or 1 dose  . [DISCONTINUED] heparin  4,000 Units Intravenous Once    Assessment: 78 yo male with known hx CAD, CABG transferred from [facility] hospital with chest pain. Patient is on IV heparin. Heparin level (0.58) is at-goal on 1000 units/hr. Noted plans for cath on Monday.  Goal of Therapy:  Heparin level 0.3-0.7 units/ml Monitor platelets by anticoagulation protocol: Yes   Plan:  -No heparin changes needed -daily CBC and heparin  levels  Harland German, Pharm D 06/19/2012 11:21 AM

## 2012-06-19 NOTE — Progress Notes (Signed)
Patient ID: Daryl Joseph MRN: [id number], DOB/AGE: Nov 11, 1932   Admit date: 06/18/2012   Primary Physician: Dr Jacinto Halim Primary Cardiologist: Dr Tresa Endo  HPI:  78 y/o male followed for many years by Dr Tresa Endo. 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  The pt had CABG X 4 in 1999 and has done well from a cardiac standpoint since. Myoview April 2012 was low risk and showed NL LVF. LOV was 05/19/12. Yesterday he had SSCP after raking leaves. He went to [facility] and was admitted to the ER. His Troponin was positive. He was pain free after ASA, NTG, Dilaudid and transferred here. He is pain free now. He admits to some exertional dyspnea and SSCP while walking uphill last week.    Problem List: Past Medical History  Diagnosis Date  . CAD (coronary artery disease)   . Hypertension   . DM type 2 (diabetes mellitus, type 2)   . Hyperlipidemia     Past Surgical History  Procedure Date  . Coronary artery bypass graft   . Cervical disc surgery   . Hernia repair      Allergies:  Allergies  Allergen Reactions  . Statins     Myositis with statins     Home Medications Prescriptions prior to admission  Medication Sig Dispense Refill  . amLODipine (NORVASC) 10 MG tablet Take 10 mg by mouth daily.      Marland Kitchen aspirin 81 MG tablet Take 81 mg by mouth daily.      . benazepril (LOTENSIN) 20 MG tablet Take 20 mg by mouth daily.      Marland Kitchen ezetimibe (ZETIA) 10 MG tablet Take 10 mg by mouth at bedtime.      . fenofibrate (TRICOR) 48 MG tablet Take 48 mg by mouth daily.      . folic acid (FOLVITE) 1 MG tablet Take 1 mg by mouth daily.      . metFORMIN (GLUCOPHAGE) 1000 MG tablet Take 1,000 mg by mouth 4 (four) times daily.      . metoprolol (LOPRESSOR) 50 MG tablet Take 50 mg by mouth 2 (two) times daily.      . Multiple Vitamins-Minerals (EYE VITAMINS PO) Take 1 tablet by mouth every evening.      Marland Kitchen omeprazole (PRILOSEC) 20 MG capsule Take 20 mg by mouth  daily.         Family History  Problem Relation Age of Onset  . Coronary artery disease Father   . Coronary artery disease Brother   . Coronary artery disease Paternal Grandfather   . Hypertension Father   . Hypertension Mother   . Diabetes Brother   . Cancer - Colon Father   . Cancer - Other Brother     Melanoma  . Cancer - Other Brother     Renal Cell      History   Social History  . Marital Status: Married    Spouse Name: N/A    Number of Children: N/A  . Years of Education: N/A   Occupational History  . 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 Smoking status: Former Games developer  . Smokeless tobacco: Former Neurosurgeon  . Alcohol Use: No  . Drug Use: No  . Sexually Active:    Other Topics Concern  . Not on file   Social History Narrative   Married, 54 yrs, no children. Retired Paediatric nurse- 59yrs  Review of Systems: General: negative for chills, fever, night sweats or weight changes.  Cardiovascular: negative for chest pain, dyspnea on exertion, edema, orthopnea, palpitations, paroxysmal nocturnal dyspnea or shortness of breath Dermatological: negative for rash Respiratory: negative for cough or wheezing Urologic: negative for hematuria, he has occasional dribbling and trouble starting to void Abdominal: negative for nausea, vomiting, diarrhea, bright red blood per rectum, melena, or hematemesis Neurologic: negative for visual changes, syncope, or dizziness All other systems reviewed and are otherwise negative except as noted above.  Physical Exam: Blood pressure 116/57, pulse 60, temperature 98.3 F (36.8 C), temperature source Oral, resp. rate 16, height 5\' 10"  (1.778 m), weight 69.6 kg (153 lb 7 oz), SpO2 96.00%.  General appearance: alert, cooperative and no distress Neck: no carotid bruit, no JVD, supple, symmetrical, trachea midline and thyroid not enlarged, symmetric, no tenderness/mass/nodules Lungs: clear to auscultation bilaterally Heart: regular  rate and rhythm and 2/6 short systolic murmur LSB Abdomen: soft, non-tender; bowel sounds normal; no masses,  no organomegaly Extremities: extremities normal, atraumatic, no cyanosis or edema Pulses: 2+ and symmetric Skin: Skin color, texture, turgor normal. No rashes or lesions Neurologic: Grossly normal    Labs:   Results for orders placed during the hospital encounter of 06/18/12 (from the past 24 hour(s))  MRSA PCR SCREENING     Status: Normal   Collection Time   06/18/12  7:30 PM      Component Value Range   MRSA by PCR NEGATIVE  NEGATIVE  TROPONIN I     Status: Abnormal   Collection Time   06/18/12  8:49 PM      Component Value Range   Troponin I 0.42 (*) <0.30 ng/mL  CK TOTAL AND CKMB     Status: Abnormal   Collection Time   06/18/12  8:49 PM      Component Value Range   Total CK 94  7 - 232 U/L   CK, MB 5.0 (*) 0.3 - 4.0 ng/mL   Relative Index RELATIVE INDEX IS INVALID  0.0 - 2.5  HEMOGLOBIN A1C     Status: Abnormal   Collection Time   06/18/12  8:49 PM      Component Value Range   Hemoglobin A1C 6.8 (*) <5.7 %   Mean Plasma Glucose 148 (*) <117 mg/dL  GLUCOSE, CAPILLARY     Status: Abnormal   Collection Time   06/18/12  9:20 PM      Component Value Range   Glucose-Capillary 163 (*) 70 - 99 mg/dL  CBC     Status: Normal   Collection Time   06/19/12  1:05 AM      Component Value Range   WBC 9.5  4.0 - 10.5 K/uL   RBC 4.76  4.22 - 5.81 MIL/uL   Hemoglobin 14.5  13.0 - 17.0 g/dL   HCT 16.1  09.6 - 04.5 %   MCV 86.3  78.0 - 100.0 fL   MCH 30.5  26.0 - 34.0 pg   MCHC 35.3  30.0 - 36.0 g/dL   RDW 40.9  81.1 - 91.4 %   Platelets 179  150 - 400 K/uL  BASIC METABOLIC PANEL     Status: Abnormal   Collection Time   06/19/12  1:05 AM      Component Value Range   Sodium 141  135 - 145 mEq/L   Potassium 4.0  3.5 - 5.1 mEq/L   Chloride 105  96 - 112 mEq/L   CO2 26  19 - 32 mEq/L   Glucose, Bld 170 (*) 70 - 99 mg/dL   BUN 14  6 - 23 mg/dL   Creatinine, Ser 1.61   0.50 - 1.35 mg/dL   Calcium 9.4  8.4 - 09.6 mg/dL   GFR calc non Af Amer 83 (*) >90 mL/min   GFR calc Af Amer >90  >90 mL/min  HEPARIN LEVEL (UNFRACTIONATED)     Status: Normal   Collection Time   06/19/12  1:05 AM      Component Value Range   Heparin Unfractionated 0.41  0.30 - 0.70 IU/mL  TROPONIN I     Status: Abnormal   Collection Time   06/19/12  1:14 AM      Component Value Range   Troponin I 0.56 (*) <0.30 ng/mL  CK TOTAL AND CKMB     Status: Abnormal   Collection Time   06/19/12  1:14 AM      Component Value Range   Total CK 82  7 - 232 U/L   CK, MB 4.7 (*) 0.3 - 4.0 ng/mL   Relative Index RELATIVE INDEX IS INVALID  0.0 - 2.5  LIPID PANEL     Status: Abnormal   Collection Time   06/19/12  1:14 AM      Component Value Range   Cholesterol 208 (*) 0 - 200 mg/dL   Triglycerides 045 (*) <150 mg/dL   HDL 62  >40 mg/dL   Total CHOL/HDL Ratio 3.4     VLDL 31  0 - 40 mg/dL   LDL Cholesterol 981 (*) 0 - 99 mg/dL     Radiology/Studies: No results found.  EKG: :NSR without acute  ASSESSMENT AND PLAN:  Principal Problem:  *NSTEMI (non-ST elevated myocardial infarction) Active Problems:  CABG X 4, LIMA-LAD, SVG-Dx, SVG-OM-CFX, SVG-RCA 1999  Abnormal CXR, LLL infiltrate on admission  Hypertension  Diabetes mellitus, Type NIDDM  Hyperlipidemia, statin intol with a history myositis   BPH, symptoms  Plan-Cath Monday. Check f/u CXR. Check PSA. Continue Heparin, NTG.  Deland Pretty, PA-C 06/19/2012, 8:20 AM

## 2012-06-19 NOTE — Progress Notes (Signed)
ANTICOAGULATION CONSULT NOTE - Follow Up Consult  Pharmacy Consult for Heparin Indication: chest pain/ACS  No Known Allergies  Patient Measurements: Height: 5\' 10"  (177.8 cm) Weight: 157 lb 1.6 oz (71.26 kg) IBW/kg (Calculated) : 73  Heparin Dosing Weight: 70.5 kg  Vital Signs: Temp: 99.1 F (37.3 C) (11/16 0306) Temp src: Oral (11/16 0306) BP: 111/57 mmHg (11/16 0000) Pulse Rate: 72  (11/16 0100)  Labs:  Basename 06/19/12 0105 06/18/12 2049  HGB 14.5 --  HCT 41.1 --  PLT 179 --  APTT -- --  LABPROT -- --  INR -- --  HEPARINUNFRC 0.41 --  CREATININE -- --  CKTOTAL -- 94  CKMB -- 5.0*  TROPONINI -- 0.42*    CrCl is unknown because no creatinine reading has been taken.   Medical History: Past Medical History  Diagnosis Date  . CAD (coronary artery disease)   . Hypertension   . DM type 2 (diabetes mellitus, type 2)   . Hyperlipidemia     Medications:  Prescriptions prior to admission  Medication Sig Dispense Refill  . amLODipine (NORVASC) 10 MG tablet Take 10 mg by mouth daily.      Marland Kitchen aspirin 81 MG tablet Take 81 mg by mouth daily.      . benazepril (LOTENSIN) 20 MG tablet Take 20 mg by mouth daily.      Marland Kitchen ezetimibe (ZETIA) 10 MG tablet Take 10 mg by mouth at bedtime.      . fenofibrate (TRICOR) 48 MG tablet Take 48 mg by mouth daily.      . folic acid (FOLVITE) 1 MG tablet Take 1 mg by mouth daily.      . metFORMIN (GLUCOPHAGE) 1000 MG tablet Take 1,000 mg by mouth 4 (four) times daily.      . metoprolol (LOPRESSOR) 50 MG tablet Take 50 mg by mouth 2 (two) times daily.      . Multiple Vitamins-Minerals (EYE VITAMINS PO) Take 1 tablet by mouth every evening.      Marland Kitchen omeprazole (PRILOSEC) 20 MG capsule Take 20 mg by mouth daily.        Assessment: 78 yo male with known hx CAD, CABG transferred from [facility] hospital with chest pain. Patient is on IV heparin. Heparin level (0.41) is at-goal on 1000 units/hr.   Goal of Therapy:  Heparin level 0.3-0.7  units/ml Monitor platelets by anticoagulation protocol: Yes   Plan:  1. Continue IV heparin at 1000 units/hr. 2. Heparin level in 8 hours to confirm dosing.   Emeline Gins 06/19/2012,3:39 AM

## 2012-06-19 NOTE — Progress Notes (Signed)
Pt. Made NPO for potential cath after assessment in am by MD in lieu of any future chest pain.  Wife called and made aware of pt's status per his request.

## 2012-06-19 NOTE — Progress Notes (Signed)
Pt. Seen and examined. Agree with the NP/PA-C note as written. 78 yo male with CABG x 4 in 1999, presented with mild SSCP and found to have NSTEMI. There is also radiographic question of PNA. He has had cough, no chills or fever and WBC count is not elevated. I suspect he may have graft dysfunction or loss. Recommend continuing heparin and nitroglycerin. Will obtain PA/LAT CXR and treat for PNA if suspected. Check PSA and BNP as well.  Appreciate hospital medicine assistance. We will assume care of this patient.  Chrystie Nose, MD, [facility] Attending Cardiologist [facility]

## 2012-06-19 NOTE — Progress Notes (Signed)
Pt. Began having 8/10 chest pain radiating down left arm at approx 1845.  NTG gtt. Was increased to approx 35 mcg. Over a period of 5 minutes.  Morphine, 2 mg. Given IVP.  B/P elevated to above 190 systolic.  Pt. Was not diaphoretic.  Pt. Anxious.  [employer] notified of Pt's status.  2200 Metoprolol given at this time.    1900:  Pain 0/10  Will continue to monitor.  NTG weaned down to 20 mcg/min.

## 2012-06-20 LAB — TROPONIN I: Troponin I: 0.35 ng/mL (ref <0.30)

## 2012-06-20 LAB — GLUCOSE, CAPILLARY: Glucose-Capillary: 165 mg/dL — ABNORMAL HIGH (ref 70–99)

## 2012-06-20 LAB — CBC
MCV: 86.6 fL (ref 78.0–100.0)
Platelets: 163 10*3/uL (ref 150–400)
RBC: 4.48 MIL/uL (ref 4.22–5.81)
WBC: 8.7 10*3/uL (ref 4.0–10.5)

## 2012-06-20 MED ORDER — PANTOPRAZOLE SODIUM 40 MG PO TBEC
40.0000 mg | DELAYED_RELEASE_TABLET | Freq: Every day | ORAL | Status: DC
  Administered 2012-06-20 – 2012-06-21 (×2): 40 mg via ORAL
  Filled 2012-06-20 (×2): qty 1

## 2012-06-20 MED ORDER — HEPARIN (PORCINE) IN NACL 100-0.45 UNIT/ML-% IJ SOLN
1100.0000 [IU]/h | INTRAMUSCULAR | Status: DC
  Administered 2012-06-20: 1100 [IU]/h via INTRAVENOUS
  Filled 2012-06-20 (×2): qty 250

## 2012-06-20 NOTE — Progress Notes (Signed)
Pt. Is pain free at this time.

## 2012-06-20 NOTE — Progress Notes (Signed)
Pt. C/o "slight chest tightness".  Does not appear in acute distress. States is 3/10.  30 ml. Maalox given initially.  Will closely monitor.  Pt. does state he is hungry.  Color unchanged.

## 2012-06-20 NOTE — Progress Notes (Signed)
Subjective:  Chest pain yesterday- "worst ever" lt sided with radiation down Lt arm. Sxs lasted about 15 minutes. Relieved with IV NTG. EKG shows inferior and lat ST depression. No pain now.  Objective:  Vital Signs in the last 24 hours: Temp:  [98.2 F (36.8 C)-99 F (37.2 C)] 98.7 F (37.1 C) (11/17 0342) Pulse Rate:  [59-88] 62  (11/17 0601) Resp:  [12-20] 14  (11/17 0601) BP: (108-187)/(55-111) 113/57 mmHg (11/17 0601) SpO2:  [94 %-100 %] 96 % (11/17 0601)  Intake/Output from previous day:  Intake/Output Summary (Last 24 hours) at 06/20/12 0749 Last data filed at 06/20/12 0600  Gross per 24 hour  Intake 1587.68 ml  Output   1975 ml  Net -387.32 ml    Physical Exam: General appearance: alert, cooperative and no distress Lungs: clear to auscultation bilaterally Heart: regular rate and rhythm and 1-2/6 syst murmur LSB   Rate: 62  Rhythm: normal sinus rhythm  Lab Results:  Basename 06/20/12 0542 06/19/12 0105  WBC 8.7 9.5  HGB 13.4 14.5  PLT 163 179    Basename 06/19/12 0105  NA 141  K 4.0  CL 105  CO2 26  GLUCOSE 170*  BUN 14  CREATININE 0.83    Basename 06/19/12 0931 06/19/12 0114  TROPONINI 0.34* 0.56*   Hepatic Function Panel No results found for this basename: PROT,ALBUMIN,AST,ALT,ALKPHOS,BILITOT,BILIDIR,IBILI in the last 72 hours  Basename 06/19/12 0114  CHOL 208*   No results found for this basename: INR in the last 72 hours  Imaging: Imaging results have been reviewed  Cardiac Studies:  Assessment/Plan:   Principal Problem:  *NSTEMI (non-ST elevated myocardial infarction) Active Problems:  CABG X 4, LIMA-LAD, SVG-Dx, SVG-OM-CFX, SVG-RCA 1999  Hypertension  Diabetes mellitus, Type NIDDM  Hyperlipidemia, statin intol with a history myositis   BPH, symptoms  Plan- Will discuss with MD- ? PCI today, ?Integrellin. Check EKG this am    Corine Shelter PA-C 06/20/2012, 7:49 AM

## 2012-06-20 NOTE — Progress Notes (Signed)
ANTICOAGULATION CONSULT NOTE - Follow Up Consult  Pharmacy Consult for Heparin Indication: chest pain/ACS  Allergies  Allergen Reactions  . Statins     Myositis with statins    Patient Measurements: Height: 5\' 10"  (177.8 cm) Weight: 153 lb 7 oz (69.6 kg) (standing scale) IBW/kg (Calculated) : 73  Heparin Dosing Weight: 70.5 kg  Vital Signs: Temp: 98.1 F (36.7 C) (11/17 1209) Temp src: Oral (11/17 1209) BP: 122/66 mmHg (11/17 1000) Pulse Rate: 66  (11/17 1000)  Labs:  Basename 06/20/12 0851 06/20/12 0542 06/19/12 0931 06/19/12 0927 06/19/12 0114 06/19/12 0105 06/18/12 2049  HGB -- 13.4 -- -- -- 14.5 --  HCT -- 38.8* -- -- -- 41.1 --  PLT -- 163 -- -- -- 179 --  APTT -- -- -- -- -- -- --  LABPROT -- -- -- -- -- -- --  INR -- -- -- -- -- -- --  HEPARINUNFRC -- 0.29* -- 0.58 -- 0.41 --  CREATININE -- -- -- -- -- 0.83 --  CKTOTAL -- -- 69 -- 82 -- 94  CKMB -- -- 3.6 -- 4.7* -- 5.0*  TROPONINI 0.35* -- 0.34* -- 0.56* -- --    Estimated Creatinine Clearance: 73.4 ml/min (by C-G formula based on Cr of 0.83).   Medications:  Scheduled:     . amLODipine  10 mg Oral Daily  . aspirin EC  325 mg Oral Daily  . benazepril  20 mg Oral Daily  . ezetimibe  10 mg Oral QHS  . fenofibrate  54 mg Oral Daily  . insulin aspart  0-5 Units Subcutaneous QHS  . insulin aspart  0-9 Units Subcutaneous TID WC  . metoprolol  50 mg Oral BID  . multivitamin-lutein  1 capsule Oral Daily  . pantoprazole (PROTONIX) IV  40 mg Intravenous Q24H    Assessment: 78 yo male with known hx CAD, CABG transferred from [facility] with chest pain. Patient is on IV heparin. Heparin level (0.29) is at below goal on 1000 units/hr. Noted plans for cath on Monday.  Goal of Therapy:  Heparin level 0.3-0.7 units/ml Monitor platelets by anticoagulation protocol: Yes   Plan:  -Increase heparin to 1100 units/he -Daily CBC and heparin levels  Harland German, Pharm D 06/20/2012 1:30 PM

## 2012-06-20 NOTE — Progress Notes (Signed)
Pt. Seen and examined. Agree with the NP/PA-C note as written.  Had another episode of post-prandial chest pain yesterday which was intense, did resolve with increasing nitrates and pain medication after 15 minutes. EKG shows downsloping J-point depression, no clear ST segment depression or elevation. He has been chest pain free since. I would continue current medications and plan catheterization tomorrow unless there are any dynamic EKG changes, increases in troponin or recurrent chest pain.  Chrystie Nose, MD, [facility] Attending Cardiologist [facility]

## 2012-06-21 ENCOUNTER — Encounter (HOSPITAL_COMMUNITY)
Admission: AD | Disposition: A | Discharge status: Home / Self Care | Payer: Self-pay | Source: Other Acute Inpatient Hospital | Attending: Cardiology

## 2012-06-21 HISTORY — PX: LEFT HEART CATHETERIZATION WITH CORONARY/GRAFT ANGIOGRAM: SHX5450

## 2012-06-21 LAB — HEPATIC FUNCTION PANEL
ALT: 27 U/L (ref 0–53)
Alkaline Phosphatase: 65 U/L (ref 39–117)
Total Protein: 6.7 g/dL (ref 6.0–8.3)

## 2012-06-21 LAB — GLUCOSE, CAPILLARY
Glucose-Capillary: 142 mg/dL — ABNORMAL HIGH (ref 70–99)
Glucose-Capillary: 229 mg/dL — ABNORMAL HIGH (ref 70–99)

## 2012-06-21 LAB — CBC
HCT: 38.4 % — ABNORMAL LOW (ref 39.0–52.0)
MCHC: 34.1 g/dL (ref 30.0–36.0)
MCV: 86.1 fL (ref 78.0–100.0)
RDW: 13.2 % (ref 11.5–15.5)

## 2012-06-21 LAB — BASIC METABOLIC PANEL
BUN: 16 mg/dL (ref 6–23)
Creatinine, Ser: 0.83 mg/dL (ref 0.50–1.35)
GFR calc non Af Amer: 83 mL/min — ABNORMAL LOW (ref >90)
Glucose, Bld: 142 mg/dL — ABNORMAL HIGH (ref 70–99)
Potassium: 4.3 mEq/L (ref 3.5–5.1)

## 2012-06-21 LAB — HEPARIN LEVEL (UNFRACTIONATED): Heparin Unfractionated: 0.3 IU/mL (ref 0.30–0.70)

## 2012-06-21 SURGERY — LEFT HEART CATHETERIZATION WITH CORONARY/GRAFT ANGIOGRAM
Anesthesia: LOCAL

## 2012-06-21 MED ORDER — SODIUM CHLORIDE 0.9 % IJ SOLN
3.0000 mL | Freq: Two times a day (BID) | INTRAMUSCULAR | Status: DC
  Administered 2012-06-21 – 2012-06-22 (×2): 3 mL via INTRAVENOUS

## 2012-06-21 MED ORDER — ASPIRIN 81 MG PO CHEW
324.0000 mg | CHEWABLE_TABLET | ORAL | Status: AC
  Administered 2012-06-21: 324 mg via ORAL
  Filled 2012-06-21: qty 4

## 2012-06-21 MED ORDER — NAPHAZOLINE-GLYCERIN 0.012-0.2 % OP SOLN
1.0000 [drp] | OPHTHALMIC | Status: DC | PRN
  Administered 2012-06-21 – 2012-06-22 (×2): 2 [drp] via OPHTHALMIC
  Filled 2012-06-21: qty 15

## 2012-06-21 MED ORDER — ACETAMINOPHEN 325 MG PO TABS: 650.0000 mg | ORAL_TABLET | ORAL | Status: DC | PRN

## 2012-06-21 MED ORDER — SODIUM CHLORIDE 0.9 % IJ SOLN
3.0000 mL | Freq: Two times a day (BID) | INTRAMUSCULAR | Status: DC
  Administered 2012-06-21 (×2): 3 mL via INTRAVENOUS

## 2012-06-21 MED ORDER — HEPARIN (PORCINE) IN NACL 2-0.9 UNIT/ML-% IJ SOLN
INTRAMUSCULAR | Status: AC
  Filled 2012-06-21: qty 1000

## 2012-06-21 MED ORDER — SODIUM CHLORIDE 0.9 % IV SOLN: 250.0000 mL | INTRAVENOUS | Status: DC

## 2012-06-21 MED ORDER — HEPARIN (PORCINE) IN NACL 2-0.9 UNIT/ML-% IJ SOLN
INTRAMUSCULAR | Status: AC
  Filled 2012-06-21: qty 500

## 2012-06-21 MED ORDER — FENTANYL CITRATE 0.05 MG/ML IJ SOLN
INTRAMUSCULAR | Status: AC
  Filled 2012-06-21: qty 2

## 2012-06-21 MED ORDER — LIDOCAINE HCL (PF) 1 % IJ SOLN
INTRAMUSCULAR | Status: AC
  Filled 2012-06-21: qty 30

## 2012-06-21 MED ORDER — NITROGLYCERIN 0.2 MG/ML ON CALL CATH LAB
INTRAVENOUS | Status: AC
  Filled 2012-06-21: qty 1

## 2012-06-21 MED ORDER — SODIUM CHLORIDE 0.9 % IV SOLN: 1.0000 mL/kg/h | INTRAVENOUS | Status: AC

## 2012-06-21 MED ORDER — DIAZEPAM 5 MG PO TABS
5.0000 mg | ORAL_TABLET | ORAL | Status: AC
  Administered 2012-06-21: 5 mg via ORAL
  Filled 2012-06-21: qty 1

## 2012-06-21 MED ORDER — MIDAZOLAM HCL 2 MG/2ML IJ SOLN
INTRAMUSCULAR | Status: AC
  Filled 2012-06-21: qty 2

## 2012-06-21 MED ORDER — SODIUM CHLORIDE 0.9 % IJ SOLN: 3.0000 mL | INTRAMUSCULAR | Status: DC | PRN

## 2012-06-21 MED ORDER — ASPIRIN 81 MG PO CHEW: 324.0000 mg | CHEWABLE_TABLET | Freq: Once | ORAL | Status: DC

## 2012-06-21 MED ORDER — ASPIRIN EC 325 MG PO TBEC
325.0000 mg | DELAYED_RELEASE_TABLET | Freq: Every day | ORAL | Status: DC
  Filled 2012-06-21: qty 1

## 2012-06-21 MED ORDER — CLOPIDOGREL BISULFATE 75 MG PO TABS
75.0000 mg | ORAL_TABLET | Freq: Every day | ORAL | Status: DC
  Administered 2012-06-21 – 2012-06-22 (×2): 75 mg via ORAL
  Filled 2012-06-21 (×2): qty 1

## 2012-06-21 MED ORDER — ONDANSETRON HCL 4 MG/2ML IJ SOLN: 4.0000 mg | Freq: Four times a day (QID) | INTRAMUSCULAR | Status: DC | PRN

## 2012-06-21 MED ORDER — HEPARIN (PORCINE) IN NACL 100-0.45 UNIT/ML-% IJ SOLN
1300.0000 [IU]/h | INTRAMUSCULAR | Status: DC
  Administered 2012-06-21: 1200 [IU]/h via INTRAVENOUS
  Filled 2012-06-21 (×2): qty 250

## 2012-06-21 NOTE — Progress Notes (Signed)
ANTICOAGULATION CONSULT NOTE - Follow Up Consult  Pharmacy Consult for Heparin Indication: chest pain/ACS  Allergies  Allergen Reactions  . Statins     Myositis with statins    Patient Measurements: Height: 5\' 10"  (177.8 cm) Weight: 153 lb 7 oz (69.6 kg) (standing scale) IBW/kg (Calculated) : 73  Heparin Dosing Weight: 69.6  Vital Signs: Temp: 98.4 F (36.9 C) (11/18 0734) Temp src: Oral (11/18 0734) BP: 129/67 mmHg (11/18 0800) Pulse Rate: 68  (11/18 1107)  Labs:  Basename 06/21/12 0925 06/21/12 0512 06/20/12 0851 06/20/12 0542 06/19/12 0931 06/19/12 0927 06/19/12 0114 06/19/12 0105 06/18/12 2049  HGB -- 13.1 -- 13.4 -- -- -- -- --  HCT -- 38.4* -- 38.8* -- -- -- 41.1 --  PLT -- 169 -- 163 -- -- -- 179 --  APTT -- -- -- -- -- -- -- -- --  LABPROT 12.4 -- -- -- -- -- -- -- --  INR 0.93 -- -- -- -- -- -- -- --  HEPARINUNFRC -- 0.30 -- 0.29* -- 0.58 -- -- --  CREATININE 0.83 -- -- -- -- -- -- 0.83 --  CKTOTAL -- -- -- -- 69 -- 82 -- 94  CKMB -- -- -- -- 3.6 -- 4.7* -- 5.0*  TROPONINI -- -- 0.35* -- 0.34* -- 0.56* -- --    Estimated Creatinine Clearance: 73.4 ml/min (by C-G formula based on Cr of 0.83).   Medications:  Scheduled:     . amLODipine  10 mg Oral Daily  . [COMPLETED] aspirin  324 mg Oral Pre-Cath  . aspirin EC  325 mg Oral Daily  . benazepril  20 mg Oral Daily  . clopidogrel  75 mg Oral Q breakfast  . [COMPLETED] diazepam  5 mg Oral On Call  . ezetimibe  10 mg Oral QHS  . fenofibrate  54 mg Oral Daily  . [COMPLETED] fentaNYL      . [COMPLETED] heparin      . [COMPLETED] heparin      . insulin aspart  0-5 Units Subcutaneous QHS  . insulin aspart  0-9 Units Subcutaneous TID WC  . [COMPLETED] lidocaine      . metoprolol  50 mg Oral BID  . [COMPLETED] midazolam      . multivitamin-lutein  1 capsule Oral Daily  . [COMPLETED] nitroGLYCERIN      . pantoprazole  40 mg Oral QHS  . sodium chloride  3 mL Intravenous Q12H  . [DISCONTINUED] aspirin  324  mg Oral Once  . [DISCONTINUED] aspirin EC  325 mg Oral Daily  . [DISCONTINUED] sodium chloride  3 mL Intravenous Q12H    Assessment: 78 yo male with known hx CAD, CABG transferred from [facility] with chest pain to resume heparin 8 hours post sheath removal s/p cath today. Heparin level was therapeutic prior to cath. CBC is wnl and no bleeding noted.   Goal of Therapy:  Heparin level 0.3-0.7 units/ml Monitor platelets by anticoagulation protocol: Yes   Plan:  -Begin heparin at 1200 units/hr at 2100 tonight and f/u 8 hour heparin level (AM labs)  Thank you,  Brett Fairy, PharmD 06/21/2012 1:34 PM

## 2012-06-21 NOTE — Progress Notes (Signed)
ANTICOAGULATION CONSULT NOTE - Follow Up Consult  Pharmacy Consult for Heparin Indication: chest pain/ACS  Allergies  Allergen Reactions  . Statins     Myositis with statins    Patient Measurements: Height: 5\' 10"  (177.8 cm) Weight: 153 lb 7 oz (69.6 kg) (standing scale) IBW/kg (Calculated) : 73  Heparin Dosing Weight: 69.6  Vital Signs: Temp: 98.4 F (36.9 C) (11/18 0734) Temp src: Oral (11/18 0734) BP: 129/67 mmHg (11/18 0800) Pulse Rate: 68  (11/18 0800)  Labs:  Basename 06/21/12 0512 06/20/12 0851 06/20/12 0542 06/19/12 0931 06/19/12 0927 06/19/12 0114 06/19/12 0105 06/18/12 2049  HGB 13.1 -- 13.4 -- -- -- -- --  HCT 38.4* -- 38.8* -- -- -- 41.1 --  PLT 169 -- 163 -- -- -- 179 --  APTT -- -- -- -- -- -- -- --  LABPROT -- -- -- -- -- -- -- --  INR -- -- -- -- -- -- -- --  HEPARINUNFRC 0.30 -- 0.29* -- 0.58 -- -- --  CREATININE -- -- -- -- -- -- 0.83 --  CKTOTAL -- -- -- 69 -- 82 -- 94  CKMB -- -- -- 3.6 -- 4.7* -- 5.0*  TROPONINI -- 0.35* -- 0.34* -- 0.56* -- --    Estimated Creatinine Clearance: 73.4 ml/min (by C-G formula based on Cr of 0.83).   Medications:  Scheduled:     . amLODipine  10 mg Oral Daily  . aspirin  324 mg Oral Pre-Cath  . aspirin EC  325 mg Oral Daily  . benazepril  20 mg Oral Daily  . diazepam  5 mg Oral On Call  . ezetimibe  10 mg Oral QHS  . fenofibrate  54 mg Oral Daily  . insulin aspart  0-5 Units Subcutaneous QHS  . insulin aspart  0-9 Units Subcutaneous TID WC  . metoprolol  50 mg Oral BID  . multivitamin-lutein  1 capsule Oral Daily  . pantoprazole  40 mg Oral QHS  . sodium chloride  3 mL Intravenous Q12H  . [DISCONTINUED] aspirin  324 mg Oral Once  . [DISCONTINUED] pantoprazole (PROTONIX) IV  40 mg Intravenous Q24H    Assessment: 78 yo male with known hx CAD, CABG transferred from [facility] hospital with chest pain. Patient is on IV heparin. Heparin level is therapeutic at 0.3 on 1100 units/hr. Noted plans for cath on  today. CBC is wnl and no bleeding noted.   Goal of Therapy:  Heparin level 0.3-0.7 units/ml Monitor platelets by anticoagulation protocol: Yes   Plan:  -Continue heparin at 1100 units/hr and f/u after cath   Thank you,  Brett Fairy, PharmD 06/21/2012 9:36 AM

## 2012-06-21 NOTE — Care Management Note (Signed)
    Page 1 of 1   06/21/2012     9:05:18 AM   CARE MANAGEMENT NOTE 06/21/2012  Patient:  JAMORIAN, DIMARIA   Account Number:  [id number]  Date Initiated:  06/21/2012  Documentation initiated by:  Junius Creamer  Subjective/Objective Assessment:   adm w pos troponins     Action/Plan:   lives w wife   Anticipated DC Date:     Anticipated DC Plan:        DC Planning Services  CM consult      Choice offered to / List presented to:             Status of service:   Medicare Important Message given?   (If response is "NO", the following Medicare IM given date fields will be blank) Date Medicare IM given:   Date Additional Medicare IM given:    Discharge Disposition:    Per UR Regulation:  Reviewed for med. necessity/level of care/duration of stay  If discussed at Long Length of Stay Meetings, dates discussed:    Comments:  11/18 9a debbie Kween Bacorn rn,bsn [phone]

## 2012-06-21 NOTE — CV Procedure (Signed)
Daryl Joseph,Daryl Joseph Male, 78 y.o., 17-Mar-1933  Location: [facility]  Bed: NONE  MRN: [id number]  CSN: [id number]  06/21/2012  CARDIAC CATHETERIZATION REPORT   Procedures performed:  1. Left heart catheterization  2. Selective coronary angiography  3. Selective angiography of saphenous vein graft bypasses and left internal mammary artery bypass  Reason for procedure:  Unstable angina pectoris Coronary artery disease status post previous coronary artery bypass surgery  Procedure performed by: Thurmon Fair, MD, [facility]  Complications: none   Estimated blood loss: less than 5 mL   History:  This 78 year old gentleman with a history of coronary bypass surgery 14 years ago presents with typical symptoms of unstable angina pectoris and a minor increase in cardiac troponin I, consistent with an acute coronary syndrome  Consent: The risks, benefits, and details of the procedure were explained to the patient. Risks including death, MI, stroke, bleeding, limb ischemia, renal failure and allergy were described and accepted by the patient. Informed written consent was obtained prior to proceeding.  Technique: The patient was brought to the cardiac catheterization laboratory in the fasting state. He was prepped and draped in the usual sterile fashion. Local anesthesia with 1% lidocaine was administered to the right groin area. Using the modified Seldinger technique a 5 French right common femoral artery sheath was introduced without difficulty. Under fluoroscopic guidance, using 5 [place] JL4, JR and angled pigtail catheters, selective cannulation of the left coronary artery, right coronary artery and left ventricle were respectively performed. Several coronary angiograms in a variety of projections were recorded. A JR catheter was also used to perform selective cannulation of the saphenous vein grafts to the right coronary artery, sequential saphenous vein graft to the circumflex and oblique marginal artery  and saphenous vein graft to the diagonal artery. It was then used to cannulate the left subclavian artery and exchanged over a long wire for a 5 [place] IM catheter, with which selective cannulation angiography of the mammary artery bypass was performed. Finally an angle pigtail catheter was used to cross the aortic valve the left ventriculogram was not performed to reduce contrast exposure. Left ventricular pressure and a pull back to the aorta were recorded. No immediate complications occurred. At the end of the procedure, all catheters were removed. After the procedure, hemostasis will be achieved with manual pressure.  Contrast used: 65 mL Omnipaque Medications administered Versed 1 mg and fentanyl 25 mcg intravenously  Angiographic Findings:  1. The left main coronary artery is moderately calcified but free of significant stenosis and bifurcates in the usual fashion into the left anterior descending artery and left circumflex coronary artery. However the heavily calcified left anterior descending artery is completely occluded at the ostium 2. The left anterior descending artery is ostially occluded. During injection of the saphenous vein graft bypass to the first diagonal artery there is retrograde flow to a small segment of the LAD artery generating 2 or 3 septal branches. Is a 90% stenosis at the ostium of the diagonal artery which limits filling of the LAD segment. The mid and distal LAD artery fill via the LIMA bypass. There is mild diffuse atherosclerosis but no flow-limiting stenoses in this portion of the LAD artery. There is no retrograde communication with the proximal LAD. 3. The left circumflex coronary artery is a large-size vessel non- dominant vessel that generates four major oblique marginal arteries. The second and third oblique marginal arteries are much larger and are the recipients of the sequential saphenous vein graft to the circumflex system.  There is evidence of mild luminal  irregularities and mild calcification. The third oblique marginal artery has a stenosis in the retrograde limb leading back towards the AV groove vessel, but no other hemodynamically meaningful stenoses are seen. 4. The right coronary artery is a large-size dominant vessel that is totally occluded following the infundibular and sinoatrial branches. It is seen filling via the saphenous vein graft bypass to the distal right coronary artery. It generates a medium-sized posterior descending artery that has a 50% ostial stenosis and an 80% mid vessel stenosis. He also generates a double posterior lateral ventricular system and there is a 60-70% distal stenosis in one of the PLV arteries. There is minimal retrograde filling of the native right coronary artery although one large right ventricular branch is seen.   5. The saphenous vein graft to the right coronary artery is a large widely patent healthy appearing vessel. A valve is seen in its proximal third. 6. The saphenous vein graft to the left circumflex system is a sequential graft to the very large second oblique marginal artery and a smaller third oblique marginal artery. It is widely patent and appears healthy. 7. The saphenous vein graft to the first diagonal artery has a very severe distal stenosis. There is a long segment of severely ulcerated and irregular stenosis just before the anastomosis of the diagonal artery. There is evidence of extensive filling defect suggestive of ulceration and thrombosis. It is clearly the culprit artery for his acute coronary syndrome, but percutaneous revascularization would almost certainly be accompanied by extensive distal embolization. The overall myocardial territory that serves is relatively small. 8. The left ventricle was not injected There is no aortic valve stenosis by pullback. The left ventricular end-diastolic pressure is 8 mm Hg.    IMPRESSIONS:  Acute coronary syndrome related to a high-grade ulcerated  stenosis in a 78 year old saphenous vein graft to the first diagonal artery. RECOMMENDATION:  Continue medical therapy with antiplatelet anticoagulant and antianginal medications. The images were reviewed by Dr. Allyson Sabal and was felt that percutaneous revascularization will be associated with an excessive risk of downstream embolization and will therefore not be beneficial.  Thurmon Fair, MD, [facility] [phone] office [phone] pager 06/21/2012 12:17 PM

## 2012-06-21 NOTE — Progress Notes (Signed)
Subjective: No further chest pain  Objective: Vital signs in last 24 hours: Temp:  [98.1 F (36.7 C)-99.1 F (37.3 C)] 98.4 F (36.9 C) (11/18 0734) Pulse Rate:  [64-86] 68  (11/18 0800) Resp:  [4-23] 4  (11/18 0800) BP: (82-173)/(49-85) 129/67 mmHg (11/18 0800) SpO2:  [95 %-99 %] 98 % (11/18 0800) Weight change:  Last BM Date: 06/18/12 Intake/Output from previous day:-59 11/17 0701 - 11/18 0700 In: 1722.5 [P.O.:1080; I.V.:642.5] Out: 1800 [Urine:1800] Intake/Output this shift:    PE: General:alert and oriented  Heart:S1S2 RRR Lungs:occ wheeze Abd:+ BS, soft, non tender Ext:no edema 2+ post tib pulses BACK: Lipoma over spine 4-5cm    Lab Results:  [facility] 06/21/12 0512 06/20/12 0542  WBC 9.2 8.7  HGB 13.1 13.4  HCT 38.4* 38.8*  PLT 169 163   BMET  Basename 06/19/12 0105  NA 141  K 4.0  CL 105  CO2 26  GLUCOSE 170*  BUN 14  CREATININE 0.83  CALCIUM 9.4    Basename 06/20/12 0851 06/19/12 0931  TROPONINI 0.35* 0.34*    Lab Results  Component Value Date   CHOL 208* 06/19/2012   HDL 62 06/19/2012   LDLCALC 115* 06/19/2012   TRIG 153* 06/19/2012   CHOLHDL 3.4 06/19/2012   Lab Results  Component Value Date   HGBA1C 6.8* 06/18/2012   Basename 06/19/12 0114  CHOL 208*     EKG: Orders placed during the hospital encounter of 06/18/12  . EKG 12-LEAD  . EKG 12-LEAD  . EKG 12-LEAD  . EKG 12-LEAD    Studies/Results: Dg Chest 2 View  06/19/2012  *RADIOLOGY REPORT*  Clinical Data: Follow up left lower lobe infiltrate  CHEST - 2 VIEW  Comparison: 06/18/2012  Findings: Mild left lower lobe opacity, less conspicuous than on the prior study, favored to reflect atelectasis. No pleural effusion or pneumothorax.  Cardiomediastinal silhouette is within normal limits. Postsurgical changes related to prior CABG.  Degenerative changes of the visualized thoracolumbar spine.  IMPRESSION: Mild left lower lobe opacity, less conspicuous than on the prior study,  favored to reflect atelectasis.   Original Report Authenticated By: Charline Bills, M.D.     Medications: I have reviewed the patient's current medications.    Marland Kitchen amLODipine  10 mg Oral Daily  . aspirin  324 mg Oral Once  . aspirin EC  325 mg Oral Daily  . benazepril  20 mg Oral Daily  . ezetimibe  10 mg Oral QHS  . fenofibrate  54 mg Oral Daily  . insulin aspart  0-5 Units Subcutaneous QHS  . insulin aspart  0-9 Units Subcutaneous TID WC  . metoprolol  50 mg Oral BID  . multivitamin-lutein  1 capsule Oral Daily  . pantoprazole  40 mg Oral QHS  . [DISCONTINUED] pantoprazole (PROTONIX) IV  40 mg Intravenous Q24H   Assessment/Plan: Principal Problem:  *NSTEMI (non-ST elevated myocardial infarction) Active Problems:  CABG X 4, LIMA-LAD, SVG-Dx, SVG-OM-CFX, SVG-RCA 1999  Hypertension  Diabetes mellitus, Type NIDDM  Hyperlipidemia, statin intol with a history myositis   BPH, symptoms  PLAN: for cardiac cath today. Check protime, bmp,  IV heparin IV NTG at 25 mcgs, have increased IV fluids to 100cc/hr. No chest pain   LOS: 3 days   [name],[name] 06/21/2012, 8:48 AM   Agree with note written by Nada Boozer RNP  Pt with known CAD s/p CABG 1999, cathed 10 yrs ago and had patent grafts. Admitted with [facility]/NSTEMI. On IV hep/ntg. Exam benign. For cath  todayRunell Gess 06/21/2012 9:24 AM

## 2012-06-22 LAB — CBC
HCT: 37.8 % — ABNORMAL LOW (ref 39.0–52.0)
MCH: 29.2 pg (ref 26.0–34.0)
MCV: 86.1 fL (ref 78.0–100.0)
Platelets: 164 10*3/uL (ref 150–400)
RDW: 13.1 % (ref 11.5–15.5)

## 2012-06-22 MED ORDER — METFORMIN HCL 1000 MG PO TABS: 1000.0000 mg | ORAL_TABLET | Freq: Four times a day (QID) | ORAL | Status: DC

## 2012-06-22 MED ORDER — ISOSORBIDE MONONITRATE ER 30 MG PO TB24: 30.0000 mg | ORAL_TABLET | Freq: Every day | ORAL | Status: DC

## 2012-06-22 MED ORDER — CLOPIDOGREL BISULFATE 75 MG PO TABS: 75.0000 mg | ORAL_TABLET | Freq: Every day | ORAL | Status: DC

## 2012-06-22 MED ORDER — ISOSORBIDE MONONITRATE ER 30 MG PO TB24
30.0000 mg | ORAL_TABLET | Freq: Every day | ORAL | Status: DC
Start: 2012-06-22 — End: 2012-06-22
  Administered 2012-06-22: 30 mg via ORAL
  Filled 2012-06-22: qty 1

## 2012-06-22 NOTE — Progress Notes (Signed)
Removed IV's from patient. Reviewed discharge instructions with patient and wife. Patient understood. Escorted patient out via wheelchair.  Andreika Vandagriff, Charlaine Dalton RN

## 2012-06-22 NOTE — Progress Notes (Signed)
ANTICOAGULATION CONSULT NOTE - Follow Up Consult  Pharmacy Consult for Heparin Indication: chest pain/ACS  Allergies  Allergen Reactions  . Statins     Myositis with statins    Patient Measurements: Height: 5\' 10"  (177.8 cm) Weight: 153 lb 7 oz (69.6 kg) (standing scale) IBW/kg (Calculated) : 73  Heparin Dosing Weight: 69.6  Vital Signs: Temp: 98.1 F (36.7 C) (11/19 0400) Temp src: Oral (11/19 0400) BP: 115/59 mmHg (11/19 0400) Pulse Rate: 78  (11/18 2000)  Labs:  Basename 06/22/12 0455 06/21/12 0925 06/21/12 0512 06/20/12 0851 06/20/12 0542 06/19/12 0931  HGB 12.8* -- 13.1 -- -- --  HCT 37.8* -- 38.4* -- 38.8* --  PLT 164 -- 169 -- 163 --  APTT -- -- -- -- -- --  LABPROT -- 12.4 -- -- -- --  INR -- 0.93 -- -- -- --  HEPARINUNFRC 0.22* -- 0.30 -- 0.29* --  CREATININE -- 0.83 -- -- -- --  CKTOTAL -- -- -- -- -- 69  CKMB -- -- -- -- -- 3.6  TROPONINI -- -- -- 0.35* -- 0.34*    Estimated Creatinine Clearance: 73.4 ml/min (by C-G formula based on Cr of 0.83).  Assessment: 78 yo male with CAD s/p cath for Heparin   Goal of Therapy:  Heparin level 0.3-0.7 units/ml Monitor platelets by anticoagulation protocol: Yes   Plan:  Increase Heparin 1300 units/hr Check heparin level in 8 hours.  Geannie Risen, PharmD, BCPS 06/22/2012 6:24 AM

## 2012-06-22 NOTE — Progress Notes (Signed)
[facility]  Subjective: No complaints. Pt states, "I feel great this morning". Denies CP and SOB.  Objective: Vital signs in last 24 hours: Temp:  [98.1 F (36.7 C)-98.5 F (36.9 C)] 98.1 F (36.7 C) (11/19 0400) Pulse Rate:  [68-78] 78  (11/18 2000) Resp:  [4-20] 14  (11/19 0700) BP: (96-138)/(48-89) 120/48 mmHg (11/19 0600) SpO2:  [93 %-100 %] 95 % (11/19 0400) Last BM Date: 06/18/12  Intake/Output from previous day: 11/18 0701 - 11/19 0700 In: 919.8 [I.V.:919.8] Out: 1300 [Urine:1300] Intake/Output this shift:    Medications Current Facility-Administered Medications  Medication Dose Route Frequency Provider Last Rate Last Dose  . 0.9 %  sodium chloride infusion   Intravenous Continuous Harvette C Jenkins, MD      . 0.9 %  sodium chloride infusion  250 mL Intravenous Continuous Mihai Croitoru, MD      . [EXPIRED] 0.9 %  sodium chloride infusion  1 mL/kg/hr Intravenous Continuous Mihai Croitoru, MD 69.6 mL/hr at 06/21/12 1315 1 mL/kg/hr at 06/21/12 1315  . acetaminophen (TYLENOL) suppository 650 mg  650 mg Rectal Q6H PRN Ron Parker, MD      . acetaminophen (TYLENOL) tablet 650 mg  650 mg Oral Q4H PRN Mihai Croitoru, MD      . alum & mag hydroxide-simeth (MAALOX/MYLANTA) 200-200-20 MG/5ML suspension 30 mL  30 mL Oral Q6H PRN Ron Parker, MD   30 mL at 06/20/12 1544  . amLODipine (NORVASC) tablet 10 mg  10 mg Oral Daily Ron Parker, MD   10 mg at 06/21/12 1007  . [COMPLETED] aspirin chewable tablet 324 mg  324 mg Oral Pre-Cath Nada Boozer, NP   324 mg at 06/21/12 1008  . aspirin EC tablet 325 mg  325 mg Oral Daily Ron Parker, MD   325 mg at 06/20/12 0919  . benazepril (LOTENSIN) tablet 20 mg  20 mg Oral Daily Ron Parker, MD   20 mg at 06/20/12 0919  . clopidogrel (PLAVIX) tablet 75 mg  75 mg Oral Q breakfast Mihai Croitoru, MD   75 mg at 06/21/12 1409  . [COMPLETED] diazepam (VALIUM) tablet 5 mg  5 mg Oral On  Call Nada Boozer, NP   5 mg at 06/21/12 1034  . ezetimibe (ZETIA) tablet 10 mg  10 mg Oral QHS Ron Parker, MD   10 mg at 06/21/12 2244  . fenofibrate tablet 54 mg  54 mg Oral Daily Ron Parker, MD   54 mg at 06/21/12 1007  . [COMPLETED] fentaNYL (SUBLIMAZE) 0.05 MG/ML injection           . [COMPLETED] heparin 2-0.9 UNIT/ML-% infusion           . [COMPLETED] heparin 2-0.9 UNIT/ML-% infusion           . heparin ADULT infusion 100 units/mL (25000 units/250 mL)  1,300 Units/hr Intravenous Continuous Marykay Lex, MD 13 mL/hr at 06/22/12 0634 1,300 Units/hr at 06/22/12 0634  . insulin aspart (novoLOG) injection 0-5 Units  0-5 Units Subcutaneous QHS Harvette C Jenkins, MD      . insulin aspart (novoLOG) injection 0-9 Units  0-9 Units Subcutaneous TID WC Ron Parker, MD   3 Units at 06/21/12 1657  . [COMPLETED] lidocaine (XYLOCAINE) 1 % injection           . metoprolol (LOPRESSOR) tablet 50 mg  50 mg Oral BID Ron Parker, MD   50 mg at 06/21/12 2242  . [  COMPLETED] midazolam (VERSED) 2 MG/2ML injection           . morphine 2 MG/ML injection 2 mg  2 mg Intravenous Q2H PRN Ron Parker, MD   2 mg at 06/20/12 1610  . multivitamin-lutein (OCUVITE-LUTEIN) capsule 1 capsule  1 capsule Oral Daily Marykay Lex, MD   1 capsule at 06/21/12 1007  . naphazoline-glycerin (CLEAR EYES) ophth solution 1-2 drop  1-2 drop Both Eyes Q4H PRN Nada Boozer, NP   2 drop at 06/21/12 1623  . [COMPLETED] nitroGLYCERIN (NTG ON-CALL) 0.2 mg/mL injection           . nitroGLYCERIN 0.2 mg/mL in dextrose 5 % infusion  5 mcg/min Intravenous Titrated Ron Parker, MD 3 mL/hr at 06/21/12 2000 10 mcg/min at 06/21/12 2000  . ondansetron (ZOFRAN) tablet 4 mg  4 mg Oral Q6H PRN Ron Parker, MD       Or  . ondansetron (ZOFRAN) injection 4 mg  4 mg Intravenous Q6H PRN Harvette Velora Heckler, MD      . ondansetron (ZOFRAN) injection 4 mg  4 mg Intravenous Q6H PRN Mihai Croitoru, MD      .  oxyCODONE (Oxy IR/ROXICODONE) immediate release tablet 5 mg  5 mg Oral Q4H PRN Ron Parker, MD      . pantoprazole (PROTONIX) EC tablet 40 mg  40 mg Oral QHS Marykay Lex, MD   40 mg at 06/21/12 2251  . sodium chloride 0.9 % injection 3 mL  3 mL Intravenous Q12H Mihai Croitoru, MD   3 mL at 06/21/12 2243  . sodium chloride 0.9 % injection 3 mL  3 mL Intravenous PRN Mihai Croitoru, MD      . zolpidem (AMBIEN) tablet 5 mg  5 mg Oral QHS PRN Ron Parker, MD      . [DISCONTINUED] 0.9 %  sodium chloride infusion  250 mL Intravenous Continuous Nada Boozer, NP 100 mL/hr at 06/21/12 1012 250 mL at 06/21/12 1012  . [DISCONTINUED] acetaminophen (TYLENOL) tablet 650 mg  650 mg Oral Q6H PRN Ron Parker, MD   650 mg at 06/19/12 0410  . [DISCONTINUED] aspirin chewable tablet 324 mg  324 mg Oral Once Marykay Lex, MD      . [DISCONTINUED] aspirin EC tablet 325 mg  325 mg Oral Daily Mihai Croitoru, MD      . [DISCONTINUED] heparin ADULT infusion 100 units/mL (25000 units/250 mL)  1,100 Units/hr Intravenous Continuous Benny Lennert, PHARMD 11 mL/hr at 06/21/12 0700 1,100 Units/hr at 06/21/12 0700  . [DISCONTINUED] sodium chloride 0.9 % injection 3 mL  3 mL Intravenous Q12H Nada Boozer, NP   3 mL at 06/21/12 1011  . [DISCONTINUED] sodium chloride 0.9 % injection 3 mL  3 mL Intravenous PRN Nada Boozer, NP        PE: General appearance: alert, cooperative and no distress Lungs: clear to auscultation bilaterally Heart: regular rate and rhythm, S1, S2 normal, 1/6 murmur LSB, no click, rub or gallop Extremities: No LEE Pulses: 2+ and symmetric Skin: Skin color, texture, turgor normal. No rashes or lesions or warm and dry Right Groin: No erythema or ecchymosis. Small hematoma.  No swelling.  Neurologic: Grossly normal  Lab Results:   Basename 06/22/12 0455 06/21/12 0512 06/20/12 0542  WBC 6.9 9.2 8.7  HGB 12.8* 13.1 13.4  HCT 37.8* 38.4* 38.8*  PLT 164 169 163    BMET  Basename 06/21/12 0925  NA 141  K 4.3  CL 104  CO2 28  GLUCOSE 142*  BUN 16  CREATININE 0.83  CALCIUM 9.3   PT/INR  Basename 06/21/12 0925  LABPROT 12.4  INR 0.93   Lipid Panel     Component Value Date/Time   CHOL 208* 06/19/2012 0114   TRIG 153* 06/19/2012 0114   HDL 62 06/19/2012 0114   CHOLHDL 3.4 06/19/2012 0114   VLDL 31 06/19/2012 0114   LDLCALC 115* 06/19/2012 0114   Cardiac Panel (last 3 results)  Basename 06/20/12 0851 06/19/12 0931  CKTOTAL -- 69  CKMB -- 3.6  TROPONINI 0.35* 0.34*  RELINDX -- RELATIVE INDEX IS INVALID   Studies/Results Angiographic Findings:  1. The left main coronary artery is moderately calcified but free of significant stenosis and bifurcates in the usual fashion into the left anterior descending artery and left circumflex coronary artery. However the heavily calcified left anterior descending artery is completely occluded at the ostium  2. The left anterior descending artery is ostially occluded. During injection of the saphenous vein graft bypass to the first diagonal artery there is retrograde flow to a small segment of the LAD artery generating 2 or 3 septal branches. Is a 90% stenosis at the ostium of the diagonal artery which limits filling of the LAD segment. The mid and distal LAD artery fill via the LIMA bypass. There is mild diffuse atherosclerosis but no flow-limiting stenoses in this portion of the LAD artery. There is no retrograde communication with the proximal LAD.  3. The left circumflex coronary artery is a large-size vessel non- dominant vessel that generates four major oblique marginal arteries. The second and third oblique marginal arteries are much larger and are the recipients of the sequential saphenous vein graft to the circumflex system. There is evidence of mild luminal irregularities and mild calcification. The third oblique marginal artery has a stenosis in the retrograde limb leading back towards the AV  groove vessel, but no other hemodynamically meaningful stenoses are seen.  4. The right coronary artery is a large-size dominant vessel that is totally occluded following the infundibular and sinoatrial branches. It is seen filling via the saphenous vein graft bypass to the distal right coronary artery. It generates a medium-sized posterior descending artery that has a 50% ostial stenosis and an 80% mid vessel stenosis. He also generates a double posterior lateral ventricular system and there is a 60-70% distal stenosis in one of the PLV arteries. There is minimal retrograde filling of the native right coronary artery although one large right ventricular branch is seen.  5. The saphenous vein graft to the right coronary artery is a large widely patent healthy appearing vessel. A valve is seen in its proximal third.  6. The saphenous vein graft to the left circumflex system is a sequential graft to the very large second oblique marginal artery and a smaller third oblique marginal artery. It is widely patent and appears healthy.  7. The saphenous vein graft to the first diagonal artery has a very severe distal stenosis. There is a long segment of severely ulcerated and irregular stenosis just before the anastomosis of the diagonal artery. There is evidence of extensive filling defect suggestive of ulceration and thrombosis. It is clearly the culprit artery for his acute coronary syndrome, but percutaneous revascularization would almost certainly be accompanied by extensive distal embolization. The overall myocardial territory that serves is relatively small.  8. The left ventricle was not injected There is no aortic valve stenosis by pullback. The left ventricular end-diastolic pressure  is 8 mm Hg.   IMPRESSIONS:  Acute coronary syndrome related to a high-grade ulcerated stenosis in a 79 year old saphenous vein graft to the first diagonal artery.  RECOMMENDATION:  Continue medical therapy with antiplatelet  anticoagulant and antianginal medications. The images were reviewed by Dr. Allyson Sabal and was felt that percutaneous revascularization will be associated with an excessive risk of downstream embolization and will therefore not be beneficial.  Thurmon Fair, MD, [facility]  [phone] office  [phone] pager  06/21/2012  12:17 PM   Assessment/Plan  Principal Problem:  *NSTEMI (non-ST elevated myocardial infarction), secondary to ulcerated plaque in VG to diag.  treating medically Active Problems:  CABG X 4, LIMA-LAD, SVG-Dx, SVG-OM-CFX, SVG-RCA 1999  Hypertension  Diabetes mellitus, Type NIDDM  Hyperlipidemia, statin intol with a history myositis   BPH, symptoms  Plan: CP free. BP and HR both stable. Labs normal. Angiography yesterday with no PCI. Plan to treat with antiplatelet, anticoagulant and antianginal medications.  Ambulate today.  D/C heparin and nitroglycerin. Will add Imdur 30 mg/daily. Consider Renexa as well. D/C home today.    LOS: 4 days    Daryl Joseph, Daryl Joseph 06/22/2012 7:50 AM  I seen and evaluated the patient this morning along with Wilburt Finlay, PA. I agree with their findings, examination as well as impression recommendations.  He looks great.  No CP since Sunday PM.   Convert to PO Nitrates along with BB & CCB.  D/c IV Heparin  Provided no CP on PO meds & off IV Heparin, anticipate d/c this PM.  At a minimum, can transfer to tele.  Marykay Lex, M.D., M.S. [facility] [address] [place], [place]  [address]  [phone] Pager # [phone] 06/22/2012 8:57 AM

## 2012-06-24 ENCOUNTER — Other Ambulatory Visit: Payer: Self-pay | Admitting: Cardiovascular Disease

## 2012-06-24 ENCOUNTER — Encounter (HOSPITAL_COMMUNITY): Payer: Self-pay | Admitting: Pharmacy Technician

## 2012-06-24 MED ORDER — TICAGRELOR 90 MG PO TABS: 90.0000 mg | ORAL_TABLET | Freq: Two times a day (BID) | ORAL | Status: DC

## 2012-06-24 MED ORDER — ASPIRIN 81 MG PO CHEW: 81.0000 mg | CHEWABLE_TABLET | Freq: Once | ORAL | Status: DC

## 2012-06-25 ENCOUNTER — Encounter (HOSPITAL_COMMUNITY): Payer: Self-pay | Admitting: General Practice

## 2012-06-25 ENCOUNTER — Ambulatory Visit (HOSPITAL_COMMUNITY)
Admission: RE | Admit: 2012-06-25 | Discharge: 2012-06-26 | Disposition: A | Discharge status: Home / Self Care | Payer: Medicare Other | Source: Ambulatory Visit | Attending: Cardiovascular Disease | Admitting: Cardiovascular Disease

## 2012-06-25 ENCOUNTER — Other Ambulatory Visit: Payer: Self-pay

## 2012-06-25 ENCOUNTER — Encounter (HOSPITAL_COMMUNITY)
Admission: RE | Disposition: A | Discharge status: Home / Self Care | Payer: Self-pay | Source: Ambulatory Visit | Attending: Cardiovascular Disease

## 2012-06-25 DIAGNOSIS — Z95820 Peripheral vascular angioplasty status with implants and grafts: Secondary | ICD-10-CM

## 2012-06-25 DIAGNOSIS — I252 Old myocardial infarction: Secondary | ICD-10-CM | POA: Insufficient documentation

## 2012-06-25 DIAGNOSIS — Z7901 Long term (current) use of anticoagulants: Secondary | ICD-10-CM | POA: Insufficient documentation

## 2012-06-25 DIAGNOSIS — I2510 Atherosclerotic heart disease of native coronary artery without angina pectoris: Secondary | ICD-10-CM | POA: Diagnosis present

## 2012-06-25 DIAGNOSIS — E119 Type 2 diabetes mellitus without complications: Secondary | ICD-10-CM | POA: Diagnosis present

## 2012-06-25 DIAGNOSIS — I10 Essential (primary) hypertension: Secondary | ICD-10-CM | POA: Diagnosis present

## 2012-06-25 DIAGNOSIS — E785 Hyperlipidemia, unspecified: Secondary | ICD-10-CM | POA: Diagnosis present

## 2012-06-25 DIAGNOSIS — I25810 Atherosclerosis of coronary artery bypass graft(s) without angina pectoris: Secondary | ICD-10-CM | POA: Insufficient documentation

## 2012-06-25 DIAGNOSIS — Z7982 Long term (current) use of aspirin: Secondary | ICD-10-CM | POA: Insufficient documentation

## 2012-06-25 DIAGNOSIS — Z79899 Other long term (current) drug therapy: Secondary | ICD-10-CM | POA: Insufficient documentation

## 2012-06-25 HISTORY — DX: Non-ST elevation (NSTEMI) myocardial infarction: I21.4

## 2012-06-25 HISTORY — DX: Peripheral vascular angioplasty status with implants and grafts: Z95.820

## 2012-06-25 HISTORY — DX: Personal history of other diseases of the digestive system: Z87.19

## 2012-06-25 HISTORY — DX: Angina pectoris, unspecified: I20.9

## 2012-06-25 HISTORY — PX: CORONARY ANGIOPLASTY WITH STENT PLACEMENT: SHX49

## 2012-06-25 HISTORY — DX: Gastro-esophageal reflux disease without esophagitis: K21.9

## 2012-06-25 HISTORY — DX: Unspecified osteoarthritis, unspecified site: M19.90

## 2012-06-25 HISTORY — PX: CARDIAC CATHETERIZATION: SHX172

## 2012-06-25 HISTORY — PX: PERCUTANEOUS CORONARY STENT INTERVENTION (PCI-S): SHX5485

## 2012-06-25 LAB — CBC
Hemoglobin: 13.9 g/dL (ref 13.0–17.0)
MCH: 29.9 pg (ref 26.0–34.0)
Platelets: 205 10*3/uL (ref 150–400)
RBC: 4.65 MIL/uL (ref 4.22–5.81)

## 2012-06-25 LAB — GLUCOSE, CAPILLARY: Glucose-Capillary: 233 mg/dL — ABNORMAL HIGH (ref 70–99)

## 2012-06-25 LAB — BASIC METABOLIC PANEL
Calcium: 9.9 mg/dL (ref 8.4–10.5)
GFR calc Af Amer: >90 mL/min (ref >90)
GFR calc non Af Amer: 84 mL/min — ABNORMAL LOW (ref >90)
Glucose, Bld: 176 mg/dL — ABNORMAL HIGH (ref 70–99)
Potassium: 4.6 mEq/L (ref 3.5–5.1)
Sodium: 139 mEq/L (ref 135–145)

## 2012-06-25 LAB — PROTIME-INR
INR: 0.96 (ref 0.00–1.49)
Prothrombin Time: 12.7 seconds (ref 11.6–15.2)

## 2012-06-25 LAB — POCT ACTIVATED CLOTTING TIME: Activated Clotting Time: 329 seconds

## 2012-06-25 SURGERY — PERCUTANEOUS CORONARY STENT INTERVENTION (PCI-S)
Anesthesia: LOCAL

## 2012-06-25 MED ORDER — SODIUM CHLORIDE 0.9 % IJ SOLN: 3.0000 mL | Freq: Two times a day (BID) | INTRAMUSCULAR | Status: DC

## 2012-06-25 MED ORDER — FOLIC ACID 1 MG PO TABS
1.0000 mg | ORAL_TABLET | Freq: Every day | ORAL | Status: DC
  Administered 2012-06-26: 1 mg via ORAL
  Filled 2012-06-25: qty 1

## 2012-06-25 MED ORDER — EZETIMIBE 10 MG PO TABS
10.0000 mg | ORAL_TABLET | Freq: Every day | ORAL | Status: DC
  Administered 2012-06-25: 21:00:00 10 mg via ORAL
  Filled 2012-06-25 (×3): qty 1

## 2012-06-25 MED ORDER — SODIUM CHLORIDE 0.9 % IV SOLN
INTRAVENOUS | Status: DC
  Administered 2012-06-25: 13:00:00 via INTRAVENOUS

## 2012-06-25 MED ORDER — DIAZEPAM 5 MG PO TABS
ORAL_TABLET | ORAL | Status: AC
  Administered 2012-06-25: 5 mg via ORAL
  Filled 2012-06-25: qty 1

## 2012-06-25 MED ORDER — FENTANYL CITRATE 0.05 MG/ML IJ SOLN
INTRAMUSCULAR | Status: AC
  Filled 2012-06-25: qty 2

## 2012-06-25 MED ORDER — SODIUM CHLORIDE 0.9 % IJ SOLN: 3.0000 mL | INTRAMUSCULAR | Status: DC | PRN

## 2012-06-25 MED ORDER — HEPARIN (PORCINE) IN NACL 2-0.9 UNIT/ML-% IJ SOLN
INTRAMUSCULAR | Status: AC
  Filled 2012-06-25: qty 1500

## 2012-06-25 MED ORDER — ONDANSETRON HCL 4 MG/2ML IJ SOLN: 4.0000 mg | Freq: Four times a day (QID) | INTRAMUSCULAR | Status: DC | PRN

## 2012-06-25 MED ORDER — ISOSORBIDE MONONITRATE ER 60 MG PO TB24
60.0000 mg | ORAL_TABLET | Freq: Every day | ORAL | Status: DC
  Administered 2012-06-26: 10:00:00 60 mg via ORAL
  Filled 2012-06-25: qty 1

## 2012-06-25 MED ORDER — METOPROLOL SUCCINATE ER 50 MG PO TB24
50.0000 mg | ORAL_TABLET | Freq: Every day | ORAL | Status: DC
  Administered 2012-06-26: 50 mg via ORAL
  Filled 2012-06-25: qty 1

## 2012-06-25 MED ORDER — ASPIRIN 81 MG PO TABS: 81.0000 mg | ORAL_TABLET | Freq: Every day | ORAL | Status: DC

## 2012-06-25 MED ORDER — SODIUM CHLORIDE 0.9 % IV SOLN
0.2500 mg/kg/h | INTRAVENOUS | Status: AC
  Filled 2012-06-25: qty 250

## 2012-06-25 MED ORDER — BENAZEPRIL HCL 20 MG PO TABS
20.0000 mg | ORAL_TABLET | Freq: Every day | ORAL | Status: DC
  Administered 2012-06-26: 20 mg via ORAL
  Filled 2012-06-25: qty 1

## 2012-06-25 MED ORDER — ACETAMINOPHEN 325 MG PO TABS: 650.0000 mg | ORAL_TABLET | ORAL | Status: DC | PRN

## 2012-06-25 MED ORDER — TICAGRELOR 90 MG PO TABS
90.0000 mg | ORAL_TABLET | Freq: Two times a day (BID) | ORAL | Status: DC
  Administered 2012-06-25 – 2012-06-26 (×2): 90 mg via ORAL
  Filled 2012-06-25 (×4): qty 1

## 2012-06-25 MED ORDER — SODIUM CHLORIDE 0.9 % IV SOLN
INTRAVENOUS | Status: DC
  Administered 2012-06-25: 09:00:00 via INTRAVENOUS

## 2012-06-25 MED ORDER — TICAGRELOR 90 MG PO TABS: 180.0000 mg | ORAL_TABLET | Freq: Once | ORAL | Status: DC

## 2012-06-25 MED ORDER — PANTOPRAZOLE SODIUM 40 MG PO TBEC
40.0000 mg | DELAYED_RELEASE_TABLET | Freq: Every day | ORAL | Status: DC
  Administered 2012-06-26: 40 mg via ORAL
  Filled 2012-06-25: qty 1

## 2012-06-25 MED ORDER — NITROGLYCERIN 0.2 MG/ML ON CALL CATH LAB
INTRAVENOUS | Status: AC
  Filled 2012-06-25: qty 1

## 2012-06-25 MED ORDER — SODIUM CHLORIDE 0.9 % IV SOLN: 250.0000 mL | INTRAVENOUS | Status: DC | PRN

## 2012-06-25 MED ORDER — AMLODIPINE BESYLATE 10 MG PO TABS
10.0000 mg | ORAL_TABLET | Freq: Every day | ORAL | Status: DC
  Administered 2012-06-26: 10 mg via ORAL
  Filled 2012-06-25: qty 1

## 2012-06-25 MED ORDER — MIDAZOLAM HCL 2 MG/2ML IJ SOLN
INTRAMUSCULAR | Status: AC
  Filled 2012-06-25: qty 2

## 2012-06-25 MED ORDER — FENOFIBRATE 54 MG PO TABS
54.0000 mg | ORAL_TABLET | Freq: Every day | ORAL | Status: DC
  Administered 2012-06-26: 54 mg via ORAL
  Filled 2012-06-25: qty 1

## 2012-06-25 MED ORDER — ASPIRIN 81 MG PO CHEW
81.0000 mg | CHEWABLE_TABLET | Freq: Every day | ORAL | Status: DC
  Administered 2012-06-25 – 2012-06-26 (×2): 81 mg via ORAL
  Filled 2012-06-25 (×3): qty 1

## 2012-06-25 MED ORDER — DIAZEPAM 5 MG PO TABS
5.0000 mg | ORAL_TABLET | ORAL | Status: AC
  Administered 2012-06-25: 5 mg via ORAL

## 2012-06-25 MED ORDER — MORPHINE SULFATE 2 MG/ML IJ SOLN
2.0000 mg | INTRAMUSCULAR | Status: DC | PRN
  Administered 2012-06-25: 16:00:00 2 mg via INTRAVENOUS
  Filled 2012-06-25: qty 1

## 2012-06-25 MED ORDER — LIDOCAINE HCL (PF) 1 % IJ SOLN
INTRAMUSCULAR | Status: AC
  Filled 2012-06-25: qty 30

## 2012-06-25 MED ORDER — BIVALIRUDIN 250 MG IV SOLR
INTRAVENOUS | Status: AC
  Filled 2012-06-25: qty 250

## 2012-06-25 NOTE — Progress Notes (Signed)
Site area: right groin  Site Prior to Removal:  Level 0  Pressure Applied For 20 MINUTES    Minutes Beginning at 1620  Manual:   yes  Patient Status During Pull:  stable  Post Pull Groin Site:  Level 0  Post Pull Instructions Given:  yes  Post Pull Pulses Present:  yes  Dressing Applied:  yes  Comments:   

## 2012-06-25 NOTE — Progress Notes (Signed)
Updated H&P:  See complete dictated note from office yesterday 06/24/2012.  Patinet now presents for attempt at PCI to severely diseased SVG to Diagonal which also supplies proximal LAD and several septal branches.  Brillint 180 mg was given yesterday and Plavix dc'd.  No change in PEX.  Pt is aware of risks and benefits of procedure including potential for distal embolization due to probable inability to use a filter wire due to proximately of long stenosis to anastomosis site.  Repeat labs are being drawn; plan PCI attempt this am.

## 2012-06-25 NOTE — CV Procedure (Signed)
PCI:  SVG to Diagonal  Daryl Joseph, 78 y.o., male  Full note dictated; see diagram in chart.  DICTATION # [id number], [id number]  Successful PCI to SVG supplying Diagonal 1 and proximal LAD and 3 septal perforators vessels.  Angiomax, Brilinta, NTG  6 Fr LBG guide, Prowater wire 2.5 x 25 Trek 3.5 x 33mm Xience Xpedition DES stent 4.0 x 20 [place] Trek  Diffuse 90% stenosis reduced to 0 with brisk [place] 3 flow to Diagonal and proximal LAD system.  Tolerated well.  Lennette Bihari, MD, [facility] 06/25/2012 12:31 PM

## 2012-06-25 NOTE — Progress Notes (Signed)
ANTICOAGULATION CONSULT NOTE - Initial Consult  Pharmacy Consult for bivalirudin for 2hrs post PCI Indication: ACS/PCI  Allergies  Allergen Reactions  . Statins     Myositis with statins    Patient Measurements: Height: 5\' 10"  (177.8 cm) Weight: 160 lb (72.576 kg) IBW/kg (Calculated) : 73  Heparin Dosing Weight: 72.6 kg  Vital Signs: Temp: 97.9 F (36.6 C) (11/22 0859) BP: 163/79 mmHg (11/22 0842) Pulse Rate: 74  (11/22 1048)  Labs:  [facility] 06/25/12 0625  HGB 13.9  HCT 40.5  PLT 205  APTT --  LABPROT 12.7  INR 0.96  HEPARINUNFRC --  CREATININE 0.80  CKTOTAL --  CKMB --  TROPONINI --    Estimated Creatinine Clearance: 79.4 ml/min (by C-G formula based on Cr of 0.8).   Medical History: Past Medical History  Diagnosis Date  . CAD (coronary artery disease)   . Hypertension   . DM type 2 (diabetes mellitus, type 2)   . Hyperlipidemia     Medications:  Scheduled:    . amLODipine  10 mg Oral Daily  . aspirin  81 mg Oral Daily  . benazepril  20 mg Oral Daily  . [COMPLETED] bivalirudin      . [COMPLETED] diazepam  5 mg Oral On Call  . ezetimibe  10 mg Oral QHS  . fenofibrate  54 mg Oral Daily  . [COMPLETED] fentaNYL      . folic acid  1 mg Oral Daily  . [COMPLETED] heparin      . isosorbide mononitrate  60 mg Oral Daily  . [COMPLETED] lidocaine      . metoprolol succinate  50 mg Oral Daily  . [COMPLETED] midazolam      . [COMPLETED] midazolam      . [COMPLETED] nitroGLYCERIN      . pantoprazole  40 mg Oral Daily  . Ticagrelor  180 mg Oral Once  . [DISCONTINUED] sodium chloride  3 mL Intravenous Q12H   Infusions:    . sodium chloride    . [DISCONTINUED] sodium chloride 75 mL/hr at 06/25/12 1191    Assessment: 78 yo male with ACS will be continued on bivalirudin x 2 hours post PCI Goal of Therapy:   Monitor platelets by anticoagulation protocol: Yes   Plan:  1) Continue bivalirudin at 0.25 mg/kg/hr x2 hours then stop  Jaslynne Dahan,  [place] 06/25/2012,1:25 PM

## 2012-06-25 NOTE — Progress Notes (Signed)
Utilization Review Completed.   Eulene Pekar, RN, BSN Nurse Case Manager  [phone]  

## 2012-06-26 LAB — BASIC METABOLIC PANEL
Chloride: 106 mEq/L (ref 96–112)
GFR calc Af Amer: >90 mL/min (ref >90)
GFR calc non Af Amer: 84 mL/min — ABNORMAL LOW (ref >90)
Potassium: 4 mEq/L (ref 3.5–5.1)
Sodium: 140 mEq/L (ref 135–145)

## 2012-06-26 LAB — GLUCOSE, CAPILLARY: Glucose-Capillary: 148 mg/dL — ABNORMAL HIGH (ref 70–99)

## 2012-06-26 LAB — CBC
Hemoglobin: 13.4 g/dL (ref 13.0–17.0)
MCHC: 34.6 g/dL (ref 30.0–36.0)
RDW: 13.5 % (ref 11.5–15.5)
WBC: 7.7 10*3/uL (ref 4.0–10.5)

## 2012-06-26 MED ORDER — TICAGRELOR 90 MG PO TABS: 90.0000 mg | ORAL_TABLET | Freq: Two times a day (BID) | ORAL | Status: DC

## 2012-06-26 NOTE — Cardiovascular Report (Signed)
[name], [name] NO.:  [id number]  MEDICAL RECORD NO.:  [id number]  LOCATION:  6526                         FACILITY:  [facility]  PHYSICIAN:  Nicki Guadalajara, M.D.     DATE OF BIRTH:  1932-09-18  DATE OF PROCEDURE:  06/25/2012 DATE OF DISCHARGE:                           CARDIAC CATHETERIZATION   INDICATIONS:  Daryl Joseph is a 78 year old gentleman who underwent CABG surgery x4 in 1999 by Dr. Tyrone Sage with a LIMA to the LAD, vein to the diagonal, sequential vein to the obtuse marginal and distal circumflex, and vein to the right coronary artery.  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  He had been stable cardiovascularly.  He does have a history of type 2 diabetes mellitus.  Last week, he developed recurrent chest pain, ruled in for non-ST-segment elevation MI, and apparently in my absence on June 21, 2012, underwent catheterization by Dr. Royann Shivers.  Please refer to that catheterization report.  The patient was found to have high-grade diffuse disease in the vein graft supplying the diagonal vessel.  There was concern that since the graft was diffusely diseased, it may not be amenable to distal filter wire protection in an old graft. Consequently, he was treated medically, sent home the following day with Plavix added to his regimen.  I have reviewed the films after my return and was concerned of the significant vasculature supplied by the graft and the high likelihood for future graft occlusion with just medical therapy.  Consequently, I saw the patient yesterday in the office.  I elected to discontinue Plavix and loaded him with 180 mg of Brilinta, increase his nitrates, and discussed potential intervention today including potential risk of distal embolization.  The patient and family wished to pursue in an attempt at opening the vein graft.  He  presents now for attempted intervention.  PROCEDURE:  Upon arrival to the catheterization laboratory, the patient was pain-free.  He had received 180 mg of Brilinta last evening and received an additional 90 mg dose this morning.  His right femoral artery was punctured anteriorly and a 6-French sheath was inserted without difficulty.  A 6-French left bypass guide was used for the procedure.  Angiomax bolus plus infusion was administered and ACT was documented to be therapeutic.  A Prowater wire was then advanced down the graft into the diagonal vessel which actually was a large diagonal system which extended to the apex.  In addition, proximal to the graft insertion, there was filling of the proximal LAD and at least 3 different septal perforating arteries.  Initial angiography confirmed diffuse 90+ percent stenoses in distal third of the graft with 80-90% eccentric stenosis just proximal to the anastomosis.  Some of the thrombus from the previous cath seemed to have been improved and there was TIMI-3 flow down the vessel.  It was felt that this was a large graft and I was concerned about primary stenting since there was diffuse irregularity to the stenoses.  Consequently, I first inserted a 2.5 x 25 mm Trek balloon to establish a smooth  inner core.  Several inflations were made commencing from the anastomosis site proximally.  This was then removed and a 3.5 x 33 mm drug-eluting Xience Xpedition stent was inserted and dilated x2.  A 4.0 x 20 mm noncompliant Trek balloon was then inserted for post stent dilatation with stent taper from approximately 3.9 mm to approximately 3.7 mm just at the anastomosis. There was brisk TIMI-3 flow.  During the procedure, the patient received several doses of intracoronary nitroglycerin.  He did not have any ECG changes.  At the completion of the procedure, the flow was brisk TIMI-3. There was no evidence for dissection, and the diffuse area of  stenoses were reduced to 0%.  Arterial sheath was sutured in place.  The plan will be to continue the patient on a reduced dose bivalirudin for several hours following the procedure.  He left the catheterization laboratory with stable hemodynamics pain-free.  HEMODYNAMIC DATA:  Central aortic pressure 102/52.  ANGIOGRAPHIC DATA:  At the start of the intervention, scout angiography in the vein graft supplying the diagonal vessel revealed this to be a large graft.  Commencing at the mid segment extending distally there was diffuse 90% stenoses that was eccentric.  There was then a gap of normal appearing vessel followed by another eccentric area of plaque up to 80- 90% proximal to the  diagonal anastomosis.  There were not ample grafts to use filter wire balloon.  Following successful PTCA and stenting with a 3.5 x 33-mm Xience Xpedition DES stent post dilated to approximately from 3.9-3.7 mm the entire graft stenosis was reduced to 0%.  There was brisk TIMI-3 flow.  The diagonal vessel was a moderate-sized vessel which extended to the apex.  Antegrade to the graft, the vessel extended into the proximal LAD and gave rise to 3 large septal perforating arteries which were supplied by this graft.  The patient left the catheterization laboratory in stable condition.  IMPRESSION:  Successful percutaneous coronary intervention of a diffusely diseased saphenous vein graft supplying the diagonal and proximal left anterior descending septal perforating system with ultimate Percutaneous transluminal coronary angioplasty/stenting with insertion of a 3.5 x 33 mm DES Xience Xpedition stent post dilated to 3.9-3.7 mm done with bivalirudin/IC nitroglycerin/Brilinta in addition to the patient's low-dose aspirin therapy.          ______________________________ Nicki Guadalajara, M.D.     TK/MEDQ  D:  06/25/2012  T:  06/26/2012  Job:  [id number]

## 2012-06-26 NOTE — Progress Notes (Signed)
CARDIAC REHAB PHASE I   PRE:  Rate/Rhythm: 74 SR  BP:  Supine:   Sitting: 141/65  Standing:    SaO2: 96% RA  MODE:  Ambulation: 600 ft   POST:  Rate/Rhythm: 91 SR  BP:  Supine:   Sitting: 141/67  Standing:    SaO2: 99% RA  [id number] Reviewed MI/PCI education with pt including risk factor modification, restrictions, CP, NTG use, calling 911, and activity progression. Pt needs reinforcement on risk factor modification regarding diabetes management and heart healthy eating. Pt declined CR referral at this time and plans to walk at home for his activity.  Annetta Maw

## 2012-06-26 NOTE — Progress Notes (Signed)
Physician Discharge Summary   Patient ID:  Daryl Joseph  MRN: [id number]  DOB/AGE: 12/24/1932 78 y.o.  Admit date: 06/25/2012  Discharge date: 06/26/2012  Discharge Diagnoses:  Active Problems:  CABG X 4, LIMA-LAD, SVG-Dx, SVG-OM-CFX, SVG-RCA 1999, now with disease in VG to diag 1, and prox LAD, associated with NSTEMI 06/17/12  Hypertension  Diabetes mellitus, Type NIDDM  Hyperlipidemia, statin intol with a history myositis  S/P angioplasty with stent, to VG -diag 1, LAD and septal perferator vessels 06/25/12   Procedures: Successful PCI to SVG supplying Diagonal 1 and proximal LAD and 3 septal perforators vessels, 06/25/12 by Dr. Kelly.  Discharged Condition: good  Hospital Course: 78 yo WMM presented electively for attempt at PCI to severely diseased SVG to Diagonal which also supplies proximal LAD and several septal branches. Brillinta 180 mg was given 06/24/12 and Plavix dc'd. Pt is aware of risks and benefits of procedure including potential for distal embolization due to probable inability to use a filter wire due to proximately of long stenosis to anastomosis site. Pt had been admitted 06/18/12 with NSTEMI with history of CABG X 4 in 1999 and had done well from a cardiac standpoint since. Myoview April 2012 was low risk and showed NL LVF.  Cardiac cath with that admit revealed acute coronary syndrome related to a high-grade ulcerated stenosis in a 14-year-old saphenous vein graft to the first diagonal artery. At that point medical therapy was planned due to excessive risk of downstream embolization. Medications were adjusted. Pt was seen by Dr. Kelly in the office and concern for acute MI if the VG completely occluded because it supplied the proximal LAD and several septal branches.  Pt underwent the procedure without complications reducing diffuse 90% stenosis to 0 with brisk Timi 3 flow to Diagonal and proximal LAD system.  Today, pt stable without complaints. Ambulated without  complications. Was evaluated by Dr. Kelly and felt stable for discharge home. He was given card for 30 days Brilinta free. His Imdur was decreased to his 60 mg dose. (Dr. Kelly had increased to 90 mg prior to procedure).    Consults: None  Significant Diagnostic Studies:  BMET    Component  Value  Date/Time    NA  140  06/26/2012 0515    K  4.0  06/26/2012 0515    CL  106  06/26/2012 0515    CO2  24  06/26/2012 0515    GLUCOSE  132*  06/26/2012 0515    BUN  13  06/26/2012 0515    CREATININE  0.79  06/26/2012 0515    CALCIUM  9.3  06/26/2012 0515    GFRNONAA  84*  06/26/2012 0515    GFRAA  >90  06/26/2012 0515    CBC    Component  Value  Date/Time    WBC  7.7  06/26/2012 0515    RBC  4.50  06/26/2012 0515    HGB  13.4  06/26/2012 0515    HCT  38.7*  06/26/2012 0515    PLT  187  06/26/2012 0515    MCV  86.0  06/26/2012 0515    MCH  29.8  06/26/2012 0515    MCHC  34.6  06/26/2012 0515    RDW  13.5  06/26/2012 0515    Discharge Exam:  Blood pressure 135/61, pulse 70, temperature 97.9 F (36.6 C), temperature source Oral, resp. rate 13, height 5' 10" (1.778 m), weight 74.3 kg (163 lb 12.8 oz), SpO2 98.00%.    AM exam: PE: General:no complaints  Heart:S1S2 RRR  Lungs:clear  Abd:+ BS soft, non tender  Ext:smal hematoma pea size area at rt groin 2+ post tib pulses    Disposition: 01-Home or Self Care     Medication List      As of 06/26/2012 1:36 PM     TAKE these medications        amLODipine 10 MG tablet     Commonly known as: NORVASC     Take 10 mg by mouth daily.     aspirin 81 MG tablet     Take 81 mg by mouth daily.     benazepril 20 MG tablet     Commonly known as: LOTENSIN     Take 20 mg by mouth daily.     EYE VITAMINS PO     Take 1 tablet by mouth every evening.     ezetimibe 10 MG tablet     Commonly known as: ZETIA     Take 10 mg by mouth at bedtime.     fenofibrate 48 MG tablet     Commonly known as: TRICOR     Take 48 mg by mouth daily.     folic  acid 1 MG tablet     Commonly known as: FOLVITE     Take 1 mg by mouth daily.     isosorbide mononitrate 60 MG 24 hr tablet     Commonly known as: IMDUR     Take 60 mg by mouth daily.     metFORMIN 1000 MG tablet     Commonly known as: GLUCOPHAGE     Take 1 tablet (1,000 mg total) by mouth 4 (four) times daily.     metoprolol succinate 50 MG 24 hr tablet     Commonly known as: TOPROL-XL     Take 50 mg by mouth daily. Take with or immediately following a meal.     omeprazole 20 MG capsule     Commonly known as: PRILOSEC     Take 20 mg by mouth daily.     Ticagrelor 90 MG Tabs tablet     Commonly known as: BRILINTA     Take 1 tablet (90 mg total) by mouth 2 (two) times daily.          Follow-up Information    Follow up with HAGER, BRYAN, PA. On 07/05/2012. (at 2:20 pm Dr. Kelly's PA)    Contact information:    [address]  [address]  [place] [address]  [phone]        Discharge Instructions:  Heart Healthy Diabetic diet  No Metformin until 06/28/12, it may interact with cath dye  Call [facility] if any bleeding, swelling or drainage at cath site. May shower, no tub baths for 48 hours for groin sticks.  No driving for 2 days, no lifting for 2 days.  Signed:  Keisean Skowron R  06/26/2012, 1:36 PM     

## 2012-06-26 NOTE — Discharge Summary (Signed)
Physician Discharge Summary   Patient ID:  Daryl Joseph  MRN: [id number]  DOB/AGE: October 14, 1932 78 y.o.  Admit date: 06/25/2012  Discharge date: 06/26/2012  Discharge Diagnoses:  Active Problems:  CABG X 4, LIMA-LAD, SVG-Dx, SVG-OM-CFX, SVG-RCA 1999, now with disease in VG to diag 1, and prox LAD, associated with NSTEMI 06/17/12  Hypertension  Diabetes mellitus, Type NIDDM  Hyperlipidemia, statin intol with a history myositis  S/P angioplasty with stent, to VG -diag 1, LAD and septal perferator vessels 06/25/12   Procedures: Successful PCI to SVG supplying Diagonal 1 and proximal LAD and 3 septal perforators vessels, 06/25/12 by Dr. Tresa Endo.  Discharged Condition: good  Hospital Course: 78 yo WMM presented electively for attempt at PCI to severely diseased SVG to Diagonal which also supplies proximal LAD and several septal branches. Brillinta 180 mg was given 06/24/12 and Plavix dc'd. Pt is aware of risks and benefits of procedure including potential for distal embolization due to probable inability to use a filter wire due to proximately of long stenosis to anastomosis site. Pt had been admitted 06/18/12 with NSTEMI with history of CABG X 4 in 1999 and had done well from a cardiac standpoint since. Myoview April 2012 was low risk and showed NL LVF.  Cardiac cath with that admit revealed acute coronary syndrome related to a high-grade ulcerated stenosis in a 78 year old saphenous vein graft to the first diagonal artery. At that point medical therapy was planned due to excessive risk of downstream embolization. Medications were adjusted. Pt was seen by Dr. Tresa Endo in the office and concern for acute MI if the VG completely occluded because it supplied the proximal LAD and several septal branches.  Pt underwent the procedure without complications reducing diffuse 90% stenosis to 0 with brisk Timi 3 flow to Diagonal and proximal LAD system.  Today, pt stable without complaints. Ambulated without  complications. Was evaluated by Dr. Tresa Endo and felt stable for discharge home. He was given card for 30 days Brilinta free. His Imdur was decreased to his 60 mg dose. (Dr. Tresa Endo had increased to 90 mg prior to procedure).    Consults: None  Significant Diagnostic Studies:  BMET    Component  Value  Date/Time    NA  140  06/26/2012 0515    K  4.0  06/26/2012 0515    CL  106  06/26/2012 0515    CO2  24  06/26/2012 0515    GLUCOSE  132*  06/26/2012 0515    BUN  13  06/26/2012 0515    CREATININE  0.79  06/26/2012 0515    CALCIUM  9.3  06/26/2012 0515    GFRNONAA  84*  06/26/2012 0515    GFRAA  >90  06/26/2012 0515    CBC    Component  Value  Date/Time    WBC  7.7  06/26/2012 0515    RBC  4.50  06/26/2012 0515    HGB  13.4  06/26/2012 0515    HCT  38.7*  06/26/2012 0515    PLT  187  06/26/2012 0515    MCV  86.0  06/26/2012 0515    MCH  29.8  06/26/2012 0515    MCHC  34.6  06/26/2012 0515    RDW  13.5  06/26/2012 0515    Discharge Exam:  Blood pressure 135/61, pulse 70, temperature 97.9 F (36.6 C), temperature source Oral, resp. rate 13, height 5\' 10"  (1.778 m), weight 74.3 kg (163 lb 12.8 oz), SpO2 98.00%.  AM exam: PE: General:no complaints  Heart:S1S2 RRR  Lungs:clear  Abd:+ BS soft, non tender  ZOX:WRUE hematoma pea size area at rt groin 2+ post tib pulses    Disposition: 01-Home or Self Care     Medication List      As of 06/26/2012 1:36 PM     TAKE these medications        amLODipine 10 MG tablet     Commonly known as: NORVASC     Take 10 mg by mouth daily.     aspirin 81 MG tablet     Take 81 mg by mouth daily.     benazepril 20 MG tablet     Commonly known as: LOTENSIN     Take 20 mg by mouth daily.     EYE VITAMINS PO     Take 1 tablet by mouth every evening.     ezetimibe 10 MG tablet     Commonly known as: ZETIA     Take 10 mg by mouth at bedtime.     fenofibrate 48 MG tablet     Commonly known as: TRICOR     Take 48 mg by mouth daily.     folic  acid 1 MG tablet     Commonly known as: FOLVITE     Take 1 mg by mouth daily.     isosorbide mononitrate 60 MG 24 hr tablet     Commonly known as: IMDUR     Take 60 mg by mouth daily.     metFORMIN 1000 MG tablet     Commonly known as: GLUCOPHAGE     Take 1 tablet (1,000 mg total) by mouth 4 (four) times daily.     metoprolol succinate 50 MG 24 hr tablet     Commonly known as: TOPROL-XL     Take 50 mg by mouth daily. Take with or immediately following a meal.     omeprazole 20 MG capsule     Commonly known as: PRILOSEC     Take 20 mg by mouth daily.     Ticagrelor 90 MG Tabs tablet     Commonly known as: BRILINTA     Take 1 tablet (90 mg total) by mouth 2 (two) times daily.          Follow-up Information    Follow up with HAGER, BRYAN, PA. On 07/05/2012. (at 2:20 pm Dr. Landry Dyke PA)    Contact information:    [address]  [address]  [place] [address]  [phone]        Discharge Instructions:  Heart Healthy Diabetic diet  No Metformin until 06/28/12, it may interact with cath dye  Call [facility] if any bleeding, swelling or drainage at cath site. May shower, no tub baths for 48 hours for groin sticks.  No driving for 2 days, no lifting for 2 days.  SignedLeone Brand  06/26/2012, 1:36 PM

## 2012-06-26 NOTE — Progress Notes (Signed)
Subjective: No chest pain  Objective: Vital signs in last 24 hours: Temp:  [97.8 F (36.6 C)-98 F (36.7 C)] 97.9 F (36.6 C) (11/23 0700) Pulse Rate:  [63-85] 70  (11/23 0700) Resp:  [13-18] 13  (11/23 0700) BP: (108-163)/(60-79) 135/61 mmHg (11/23 0700) SpO2:  [98 %-99 %] 98 % (11/23 0700) Weight:  [72.576 kg (160 lb)-74.3 kg (163 lb 12.8 oz)] 74.3 kg (163 lb 12.8 oz) (11/23 0600) Weight change:  Last BM Date: 06/25/12 Intake/Output from previous day: +780 11/22 0701 - 11/23 0700 In: 2230 [P.O.:480; I.V.:1750] Out: 1750 [Urine:1750] Intake/Output this shift:    PE: General:no complaints Heart:S1S2 RRR Lungs:clear Abd:+ BS soft, non tender ZOX:WRUE hematoma pea size area at rt groin 2+ post tib pulses    Lab Results:  Basename 06/26/12 0515 06/25/12 0625  WBC 7.7 7.5  HGB 13.4 13.9  HCT 38.7* 40.5  PLT 187 205   BMET  Basename 06/26/12 0515 06/25/12 0625  NA 140 139  K 4.0 4.6  CL 106 103  CO2 24 25  GLUCOSE 132* 176*  BUN 13 17  CREATININE 0.79 0.80  CALCIUM 9.3 9.9   No results found for this basename: TROPONINI:2,CK,MB:2 in the last 72 hours  Lab Results  Component Value Date   CHOL 208* 06/19/2012   HDL 62 06/19/2012   LDLCALC 115* 06/19/2012   TRIG 153* 06/19/2012   CHOLHDL 3.4 06/19/2012   Lab Results  Component Value Date   HGBA1C 6.8* 06/18/2012      Studies/Results: PCI 06/25/12: Successful PCI to SVG supplying Diagonal 1 and proximal LAD and 3 septal perforators vessels.  With DES Xience xpedition   Medications: I have reviewed the patient's current medications.    Marland Kitchen amLODipine  10 mg Oral Daily  . aspirin  81 mg Oral Daily  . benazepril  20 mg Oral Daily  . [COMPLETED] bivalirudin      . [COMPLETED] diazepam  5 mg Oral On Call  . ezetimibe  10 mg Oral QHS  . fenofibrate  54 mg Oral Daily  . [COMPLETED] fentaNYL      . folic acid  1 mg Oral Daily  . [COMPLETED] heparin      . isosorbide mononitrate  60 mg Oral Daily  .  [COMPLETED] lidocaine      . metoprolol succinate  50 mg Oral Daily  . [COMPLETED] midazolam      . [COMPLETED] midazolam      . [COMPLETED] nitroGLYCERIN      . pantoprazole  40 mg Oral QAC breakfast  . Ticagrelor  90 mg Oral BID  . [DISCONTINUED] aspirin  81 mg Oral Daily  . [DISCONTINUED] sodium chloride  3 mL Intravenous Q12H  . [DISCONTINUED] Ticagrelor  180 mg Oral Once   Assessment/Plan: Active Problems:  CABG X 4, LIMA-LAD, SVG-Dx, SVG-OM-CFX, SVG-RCA 1999, now with disease in VG to diag 1, and prox LAD, associated with NSTEMI 06/17/12  Hypertension  Diabetes mellitus, Type NIDDM  Hyperlipidemia, statin intol with a history myositis   S/P angioplasty with stent, to VG -diag 1, LAD and septal perferator vessels 06/25/12  PLAN: Ambulate and d/c home today.  LOS: 1 day   [name],Daryl Joseph 06/26/2012, 8:40 AM    Patient seen and examined. Agree with assessment and plan. Feels well. Will DC today on medical therapy. S/P DES stent to SVG; continue brilinta 90 mg bid with 81 mg ASA.   Daryl Bihari, MD, [facility] 06/26/2012 9:01 AM

## 2012-06-28 MED FILL — Dextrose Inj 5%: INTRAVENOUS | Qty: 50 | Status: AC

## 2012-06-28 NOTE — Discharge Summary (Signed)
Physician Discharge Summary  Patient ID: Daryl Joseph MRN: [id number] DOB/AGE: March 07, 1933 78 y.o.  Admit date: 06/18/2012 Discharge date: 06/28/2012  Admission Diagnoses:  NSTEMI  Discharge Diagnoses:  Principal Problem:  *NSTEMI (non-ST elevated myocardial infarction), secondary to ulcerated plaque in VG to diag.  treating medically Active Problems:  CABG X 4, LIMA-LAD, SVG-Dx, SVG-OM-CFX, SVG-RCA 1999, now with disease in VG to diag 1, and prox LAD, associated with NSTEMI 06/17/12  Hypertension  Diabetes mellitus, Type NIDDM  Hyperlipidemia, statin intol with a history myositis   BPH, symptoms   Discharged Condition: stable  Hospital Course:   78 y/o male followed for many years by Dr Daryl Joseph. 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 The pt had CABG X 4 in 1999 and has done well from a cardiac standpoint since. Myoview April 2012 was low risk and showed NL LVF. LOV was 05/19/12. He developed SSCP after raking leaves. He went to [facility] and was admitted to the ER. His Troponin was positive. He was pain free after ASA, NTG, Dilaudid and transferred here. He is pain free now. He admits to some exertional dyspnea and SSCP while walking uphill last week.   He was admitted as an Inpatient.  IV nitro and heparin were continued.  He was taken for [facility] which revealed cute coronary syndrome related to a high-grade ulcerated stenosis in a 78 year old saphenous vein graft to the first diagonal artery.  Optimized medical therapy was recommended.  Imdur was added.  He is on  Beta blocker, CCB, ASA and ACE-I.  HE was discharged home in stable condition after being seen by Dr. Herbie Joseph.  Consults: None  Significant Diagnostic Studies:  CARDIAC CATHETERIZATION REPORT   Procedures performed:  1. Left heart catheterization  2. Selective coronary angiography  3. Selective angiography of saphenous vein graft bypasses and left internal mammary artery  bypass  Reason for procedure:  Unstable angina pectoris  Coronary artery disease status post previous coronary artery bypass surgery  Procedure performed by: Daryl Fair, MD, [facility]  Complications: none  Estimated blood loss: less than 5 mL  History: This 78 year old gentleman with a history of coronary bypass surgery 14 years ago presents with typical symptoms of unstable angina pectoris and a minor increase in cardiac troponin I, consistent with an acute coronary syndrome  Consent: The risks, benefits, and details of the procedure were explained to the patient. Risks including death, MI, stroke, bleeding, limb ischemia, renal failure and allergy were described and accepted by the patient. Informed written consent was obtained prior to proceeding.  Technique: The patient was brought to the cardiac catheterization laboratory in the fasting state. He was prepped and draped in the usual sterile fashion. Local anesthesia with 1% lidocaine was administered to the right groin area. Using the modified Seldinger technique a 5 French right common femoral artery sheath was introduced without difficulty. Under fluoroscopic guidance, using 5 [place] JL4, JR and angled pigtail catheters, selective cannulation of the left coronary artery, right coronary artery and left ventricle were respectively performed. Several coronary angiograms in a variety of projections were recorded. A JR catheter was also used to perform selective cannulation of the saphenous vein grafts to the right coronary artery, sequential saphenous vein graft to the circumflex and oblique marginal artery and saphenous vein graft to the diagonal artery. It was then used to cannulate the left subclavian artery and exchanged over a long wire for a 5 [place] IM  catheter, with which selective cannulation angiography of the mammary artery bypass was performed. Finally an angle pigtail catheter was used to cross the aortic valve the left ventriculogram was not  performed to reduce contrast exposure. Left ventricular pressure and a pull back to the aorta were recorded. No immediate complications occurred. At the end of the procedure, all catheters were removed. After the procedure, hemostasis will be achieved with manual pressure.  Contrast used: 65 mL Omnipaque  Medications administered Versed 1 mg and fentanyl 25 mcg intravenously  Angiographic Findings:  1. The left main coronary artery is moderately calcified but free of significant stenosis and bifurcates in the usual fashion into the left anterior descending artery and left circumflex coronary artery. However the heavily calcified left anterior descending artery is completely occluded at the ostium  2. The left anterior descending artery is ostially occluded. During injection of the saphenous vein graft bypass to the first diagonal artery there is retrograde flow to a small segment of the LAD artery generating 2 or 3 septal branches. Is a 90% stenosis at the ostium of the diagonal artery which limits filling of the LAD segment. The mid and distal LAD artery fill via the LIMA bypass. There is mild diffuse atherosclerosis but no flow-limiting stenoses in this portion of the LAD artery. There is no retrograde communication with the proximal LAD.  3. The left circumflex coronary artery is a large-size vessel non- dominant vessel that generates four major oblique marginal arteries. The second and third oblique marginal arteries are much larger and are the recipients of the sequential saphenous vein graft to the circumflex system. There is evidence of mild luminal irregularities and mild calcification. The third oblique marginal artery has a stenosis in the retrograde limb leading back towards the AV groove vessel, but no other hemodynamically meaningful stenoses are seen.  4. The right coronary artery is a large-size dominant vessel that is totally occluded following the infundibular and sinoatrial branches. It is  seen filling via the saphenous vein graft bypass to the distal right coronary artery. It generates a medium-sized posterior descending artery that has a 50% ostial stenosis and an 80% mid vessel stenosis. He also generates a double posterior lateral ventricular system and there is a 60-70% distal stenosis in one of the PLV arteries. There is minimal retrograde filling of the native right coronary artery although one large right ventricular branch is seen.  5. The saphenous vein graft to the right coronary artery is a large widely patent healthy appearing vessel. A valve is seen in its proximal third.  6. The saphenous vein graft to the left circumflex system is a sequential graft to the very large second oblique marginal artery and a smaller third oblique marginal artery. It is widely patent and appears healthy.  7. The saphenous vein graft to the first diagonal artery has a very severe distal stenosis. There is a long segment of severely ulcerated and irregular stenosis just before the anastomosis of the diagonal artery. There is evidence of extensive filling defect suggestive of ulceration and thrombosis. It is clearly the culprit artery for his acute coronary syndrome, but percutaneous revascularization would almost certainly be accompanied by extensive distal embolization. The overall myocardial territory that serves is relatively small.  8. The left ventricle was not injected There is no aortic valve stenosis by pullback. The left ventricular end-diastolic pressure is 8 mm Hg.   IMPRESSIONS:  Acute coronary syndrome related to a high-grade ulcerated stenosis in a 78 year old saphenous vein  graft to the first diagonal artery.  RECOMMENDATION:  Continue medical therapy with antiplatelet anticoagulant and antianginal medications. The images were reviewed by Dr. Allyson Sabal and was felt that percutaneous revascularization will be associated with an excessive risk of downstream embolization and will therefore not  be beneficial.  Daryl Fair, MD, [facility]  [phone] office  [phone] pager  06/21/2012  12:17 PM   Lipid Panel     Component Value Date/Time   CHOL 208* 06/19/2012 0114   TRIG 153* 06/19/2012 0114   HDL 62 06/19/2012 0114   CHOLHDL 3.4 06/19/2012 0114   VLDL 31 06/19/2012 0114   LDLCALC 115* 06/19/2012 0114    CBC    Component Value Date/Time   WBC 7.7 06/26/2012 0515   RBC 4.50 06/26/2012 0515   HGB 13.4 06/26/2012 0515   HCT 38.7* 06/26/2012 0515   PLT 187 06/26/2012 0515   MCV 86.0 06/26/2012 0515   MCH 29.8 06/26/2012 0515   MCHC 34.6 06/26/2012 0515   RDW 13.5 06/26/2012 0515    BMET    Component Value Date/Time   NA 140 06/26/2012 0515   K 4.0 06/26/2012 0515   CL 106 06/26/2012 0515   CO2 24 06/26/2012 0515   GLUCOSE 132* 06/26/2012 0515   BUN 13 06/26/2012 0515   CREATININE 0.79 06/26/2012 0515   CALCIUM 9.3 06/26/2012 0515   GFRNONAA 84* 06/26/2012 0515   GFRAA >90 06/26/2012 0515     Treatments:  Discharge Exam: Blood pressure 143/62, pulse 64, temperature 98.5 F (36.9 C), temperature source Oral, resp. rate 14, height 5\' 10"  (1.778 m), weight 69.6 kg (153 lb 7 oz), SpO2 96.00%.   Disposition: 01-Home or Self Care  Discharge Orders    Future Orders Please Complete By Expires   Diet - low sodium heart healthy      Increase activity slowly      Discharge instructions      Comments:   No lifting more than a half gallon of milk or driving for three days       Medication List     As of 06/28/2012  9:36 AM    TAKE these medications         amLODipine 10 MG tablet   Commonly known as: NORVASC   Take 10 mg by mouth daily.      aspirin 81 MG tablet   Take 81 mg by mouth daily.      benazepril 20 MG tablet   Commonly known as: LOTENSIN   Take 20 mg by mouth daily.      EYE VITAMINS PO   Take 1 tablet by mouth every evening.      ezetimibe 10 MG tablet   Commonly known as: ZETIA     Take 10 mg by mouth at bedtime.      fenofibrate 48 MG tablet   Commonly known as: TRICOR   Take 48 mg by mouth daily.      folic acid 1 MG tablet   Commonly known as: FOLVITE   Take 1 mg by mouth daily.      metFORMIN 1000 MG tablet   Commonly known as: GLUCOPHAGE   Take 1 tablet (1,000 mg total) by mouth 4 (four) times daily.      omeprazole 20 MG capsule   Commonly known as: PRILOSEC   Take 20 mg by mouth daily.           Follow-up Information  Follow up with HAGER, BRYAN, PA. (Our office will call you with the appt. date and time.)    Contact information:   [address] [place] [address] [phone]          Signed: Wilburt Finlay 06/28/2012, 9:36 AM   He looked and felt well on the AM of discharge.  Severe disease of and SVG-D1, thought to be too high risk for PCI.  As he is currently not having symptoms, he is fine for d/c with close f/u with Dr. Bishop Limbo, who will review the films to determine if High Risk PCI on this SVG is warranted.  Will defer to him.   Marykay Lex, M.D., M.S. [facility] [address] [place], [place]  [address]  [phone] Pager # [phone] 06/28/2012 11:21 AM

## 2012-12-20 ENCOUNTER — Other Ambulatory Visit: Payer: Self-pay | Admitting: Pharmacist

## 2012-12-20 MED ORDER — ISOSORBIDE MONONITRATE ER 30 MG PO TB24: 30.0000 mg | ORAL_TABLET | Freq: Every day | ORAL | Status: DC

## 2013-02-02 ENCOUNTER — Encounter: Payer: Self-pay | Admitting: Pharmacist Clinician (PhC)/ Clinical Pharmacy Specialist

## 2013-02-03 ENCOUNTER — Encounter: Payer: Self-pay | Admitting: Cardiovascular Disease

## 2013-02-08 ENCOUNTER — Ambulatory Visit (INDEPENDENT_AMBULATORY_CARE_PROVIDER_SITE_OTHER): Payer: Medicare Other | Admitting: Cardiovascular Disease

## 2013-02-08 ENCOUNTER — Encounter: Payer: Self-pay | Admitting: Cardiovascular Disease

## 2013-02-08 VITALS — BP 160/82 | HR 62 | Ht 71.0 in | Wt 157.0 lb

## 2013-02-08 DIAGNOSIS — E785 Hyperlipidemia, unspecified: Secondary | ICD-10-CM

## 2013-02-08 DIAGNOSIS — I2510 Atherosclerotic heart disease of native coronary artery without angina pectoris: Secondary | ICD-10-CM

## 2013-02-08 DIAGNOSIS — I10 Essential (primary) hypertension: Secondary | ICD-10-CM

## 2013-02-08 DIAGNOSIS — E119 Type 2 diabetes mellitus without complications: Secondary | ICD-10-CM

## 2013-02-08 MED ORDER — AMLODIPINE BESYLATE 10 MG PO TABS: 10.0000 mg | ORAL_TABLET | Freq: Every day | ORAL | Status: DC

## 2013-02-08 MED ORDER — BENAZEPRIL HCL 40 MG PO TABS: 40.0000 mg | ORAL_TABLET | Freq: Every day | ORAL | Status: DC

## 2013-02-08 MED ORDER — METOPROLOL SUCCINATE ER 50 MG PO TB24: 50.0000 mg | ORAL_TABLET | Freq: Every day | ORAL | Status: DC

## 2013-02-08 NOTE — Patient Instructions (Signed)
Your physician has recommended you make the following change in your medication: INCREASED YOUR LOTENSIN TO 40 MG. This has already been sent to your pharmacy.  Your physician has requested that you have en exercise stress myoview. For further information please visit [email or web address] Please follow instruction sheet, as given.This will be scheduled for November.

## 2013-02-12 ENCOUNTER — Encounter: Payer: Self-pay | Admitting: Cardiovascular Disease

## 2013-02-12 NOTE — Progress Notes (Signed)
Patient ID: NGUYEN TODOROV, male   DOB: [birth year], 79 y.o.   MRN: [id number]     HPI: SHERI PROWS, is a 79 y.o. male who presents to the office today for 4 month cardiology evaluation.  Mr. Kentner has established coronary disease and in 1999 underwent CABG surgery with a LIMA to the LAD, vein graft to diagonal, sequential vein graft to the obtuse marginal distal circumflex coronary artery and vein graft to the RCA. 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 In November 2003 he suffered a non-ST segment elevation myocardial infarction and underwent initial catheterization by Dr. Eather Colas tomorrow. I ultimately performed intervention to this height grade graft stenosis supplying a diagonal vessel a 06/25/2012 and a 3.5x33 mm on its expedition DES stent was inserted post dilated 3.9 mm. Once his graft was opened, the proximal LAD was now significantly improved with reference to being filled from the diagonal vessel. Patient has continued to do remarkably well. He denies any significant anginal symptomatology. He does note some issues with the mild blood pressure elevation and at times he has noted some very mild leg swelling. He presents for evaluation.  Past Medical History  Diagnosis Date  . CAD (coronary artery disease) 03/25/2007    30 d monitor - sinus rhythm w/ some PACs  . Hypertension   . Hyperlipidemia   . Anginal pain   . NSTEMI (non-ST elevated myocardial infarction) 06/18/2012  . DM type 2 (diabetes mellitus, type 2)   . H/O hiatal hernia   . GERD (gastroesophageal reflux disease)   . Arthritis     "mostly in my hands" (06/25/2012)  . S/P angioplasty with stent, to VG -diag 1, LAD and septal perferator vessels  06/25/2012    Past Surgical History  Procedure Laterality Date  . Cervical disc surgery  1990's  . Cardiac catheterization  06/25/2012    3.5 x 33mm Xience Xpedition DES inserted in  distal third of LAD  . Coronary angioplasty with stent placement  06/25/2012    "1; first one for me" (06/25/2012)  . Tonsillectomy and adenoidectomy      "when I was a kid" (06/25/2012)  . Coronary artery bypass graft  10/1997    CABG X 4 LIMA to LAD; vein to diagonal; sequential vein to obtuse marginal and distal circumflex and vein to RCA  . Doppler echocardiography  09/23/2011    EF >55%; proximal septal thickening noted; mild septal hypokinesis; ; mild/mod tricuspid regurgitation; aortic root sclerosis/calcification  . Cardiovascular stress test  11/05/2010    R/S MV - EF 73%; post stress LV fcn normal, no significant wall motion abnormalities; perfusion defect in inferior myocardial region consistent w/ diaphragmatic attenuation, remaining myocardium demonstrates normal perfusion; Exercise capacity 10 METS; stress EKG showed changes from baseline EKG; study results unchanged/within normal variance of last study (09/2008)    Allergies  Allergen Reactions  . Statins Other (See Comments)    Myositis with statins "affected my liver; I almost had Hepatitis" (06/25/2012)  . Welchol (Colesevelam)     Current Outpatient Prescriptions  Medication Sig Dispense Refill  . amLODipine (NORVASC) 10 MG tablet Take 1 tablet (10 mg total) by mouth daily.  30 tablet  6  . aspirin 81 MG tablet Take 81 mg by mouth daily.      Marland Kitchen ezetimibe (ZETIA) 10 MG tablet Take 10 mg by mouth at bedtime.      Marland Kitchen  fenofibrate (TRICOR) 145 MG tablet Take 145 mg by mouth daily.      . folic acid (FOLVITE) 1 MG tablet Take 1 mg by mouth daily.      . isosorbide mononitrate (IMDUR) 30 MG 24 hr tablet Take 1 tablet (30 mg total) by mouth daily.  30 tablet  6  . metFORMIN (GLUCOPHAGE) 500 MG tablet Take 500 mg by mouth 2 (two) times daily with a meal. 2 tablets      . metoprolol succinate (TOPROL-XL) 50 MG 24 hr tablet Take 1 tablet (50 mg total) by mouth daily. Take with or immediately following a meal.  30 tablet  6  .  Multiple Vitamins-Minerals (EYE VITAMINS PO) Take 1 tablet by mouth every evening.      . nitroGLYCERIN (NITROSTAT) 0.4 MG SL tablet Place 0.4 mg under the tongue every 5 (five) minutes as needed for chest pain.      Marland Kitchen omeprazole (PRILOSEC) 20 MG capsule Take 20 mg by mouth daily.      . Ticagrelor (BRILINTA) 90 MG TABS tablet Take 1 tablet (90 mg total) by mouth 2 (two) times daily.  60 tablet  11  . benazepril (LOTENSIN) 40 MG tablet Take 1 tablet (40 mg total) by mouth daily.  30 tablet  6   No current facility-administered medications for this visit.    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 He is married with no children. He does not routinely exercise. There is no tobacco or alcohol use. 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  ROS is negative for fevers, chills or night sweats. He denies chest pressure. At times is very mild shortness of breath. He is unaware of palpitations. He denies presyncope or syncope. He denies bleeding. He denies change in GU symptoms. He denies significant leg swelling at times does have some trace edema.  Other system review is negative.  PE BP 160/82  Pulse 62  Ht 5\' 11"  (1.803 m)  Wt 157 lb (71.215 kg)  BMI 21.91 kg/m2  General: Alert, oriented, no distress.  Skin: normal turgor, no rashes HEENT: Normocephalic, atraumatic. Pupils round and reactive; sclera anicteric;no lid lag.  Nose without nasal septal hypertrophy Mouth/Parynx benign; Mallinpatti scale 2 Neck: No JVD, no carotid briuts Lungs: clear to ausculatation and percussion; no wheezing or rales Heart: RRR, s1 s2 normal 1-2/ 6 systolic murmur, unchanged Abdomen: soft, nontender; no hepatosplenomehaly, BS+; abdominal aorta nontender and not dilated by palpation. Pulses 2+ Extremities: no clubbing cyanosis or edema, Homan's sign negative  Neurologic: grossly nonfocal  ECG: Sinus rhythm at 62 beats per minute. First degree AV block with PR 248 ms. QTc interval  normal. Nonspecific ST changes.  LABS:  BMET    Component Value Date/Time   NA 140 06/26/2012 0515   K 4.0 06/26/2012 0515   CL 106 06/26/2012 0515   CO2 24 06/26/2012 0515   GLUCOSE 132* 06/26/2012 0515   BUN 13 06/26/2012 0515   CREATININE 0.79 06/26/2012 0515   CALCIUM 9.3 06/26/2012 0515   GFRNONAA 84* 06/26/2012 0515   GFRAA >90 06/26/2012 0515     Hepatic Function Panel     Component Value Date/Time   PROT 6.7 06/21/2012 0925   ALBUMIN 3.5 06/21/2012 0925   AST 34 06/21/2012 0925   ALT 27 06/21/2012 0925   ALKPHOS 65 06/21/2012 0925   BILITOT 0.4 06/21/2012 0925   BILIDIR <0.1 06/21/2012 0925   IBILI NOT CALCULATED 06/21/2012  0925     CBC    Component Value Date/Time   WBC 7.7 06/26/2012 0515   RBC 4.50 06/26/2012 0515   HGB 13.4 06/26/2012 0515   HCT 38.7* 06/26/2012 0515   PLT 187 06/26/2012 0515   MCV 86.0 06/26/2012 0515   MCH 29.8 06/26/2012 0515   MCHC 34.6 06/26/2012 0515   RDW 13.5 06/26/2012 0515     BNP    Component Value Date/Time   PROBNP 422.5 06/19/2012 0931    Lipid Panel     Component Value Date/Time   CHOL 208* 06/19/2012 0114   TRIG 153* 06/19/2012 0114   HDL 62 06/19/2012 0114   CHOLHDL 3.4 06/19/2012 0114   VLDL 31 06/19/2012 0114   LDLCALC 115* 06/19/2012 0114     RADIOLOGY: No results found.    ASSESSMENT AND PLAN: Mr. Coakley is now here status post CABG revascularization surgery. He November 2013 he suffered a non-ST segment elevation myocardial infarction and a high-grade stenosis in the graft supplying his diagonal vessel which was successfully intervened on by me upon return from my daughter's wedding. His blood pressure today is elevated at. I am recommending further titration of his Pentasa pills from 30 mg a day to 40 mg daily. Repeat laboratory obtained to make certain he tolerates this from renal standpoint. In November 2014 scheduling him for 1 year followup exercise Myoview scan following his percutaneous  intervention in non-ST segment elevation myocardial infarction. I'll see him back in the office in followup and further recommendations will be made at that time.     Lennette Bihari, MD, [facility]  02/12/2013 1:18 PM

## 2013-06-28 ENCOUNTER — Ambulatory Visit (HOSPITAL_COMMUNITY)
Admission: RE | Admit: 2013-06-28 | Discharge: 2013-06-28 | Disposition: A | Discharge status: Home / Self Care | Payer: Medicare Other | Source: Ambulatory Visit | Attending: Cardiovascular Disease | Admitting: Cardiovascular Disease

## 2013-06-28 DIAGNOSIS — Z87891 Personal history of nicotine dependence: Secondary | ICD-10-CM | POA: Insufficient documentation

## 2013-06-28 DIAGNOSIS — E119 Type 2 diabetes mellitus without complications: Secondary | ICD-10-CM | POA: Insufficient documentation

## 2013-06-28 DIAGNOSIS — Z951 Presence of aortocoronary bypass graft: Secondary | ICD-10-CM | POA: Insufficient documentation

## 2013-06-28 DIAGNOSIS — I10 Essential (primary) hypertension: Secondary | ICD-10-CM | POA: Insufficient documentation

## 2013-06-28 DIAGNOSIS — Z8249 Family history of ischemic heart disease and other diseases of the circulatory system: Secondary | ICD-10-CM | POA: Insufficient documentation

## 2013-06-28 DIAGNOSIS — Z9861 Coronary angioplasty status: Secondary | ICD-10-CM | POA: Insufficient documentation

## 2013-06-28 DIAGNOSIS — I252 Old myocardial infarction: Secondary | ICD-10-CM | POA: Insufficient documentation

## 2013-06-28 DIAGNOSIS — I2510 Atherosclerotic heart disease of native coronary artery without angina pectoris: Secondary | ICD-10-CM | POA: Insufficient documentation

## 2013-06-28 MED ORDER — TECHNETIUM TC 99M SESTAMIBI GENERIC - CARDIOLITE
10.0000 | Freq: Once | INTRAVENOUS | Status: AC | PRN
  Administered 2013-06-28: 10 via INTRAVENOUS

## 2013-06-28 MED ORDER — TECHNETIUM TC 99M SESTAMIBI GENERIC - CARDIOLITE
30.0000 | Freq: Once | INTRAVENOUS | Status: AC | PRN
  Administered 2013-06-28: 30 via INTRAVENOUS

## 2013-06-28 NOTE — Procedures (Addendum)
[facility] [address] [place] [address] [phone]  Cardiology Nuclear Med Study  Daryl Joseph is a 79 y.o. male     MRN : [id number]     DOB: June 06, 1933  Procedure Date: 06/28/2013  Nuclear Med Background Indication for Stress Test:  Graft Patency, Stent Patency and Abnormal EKG History:  CAD;MI-06/2002;CABG X4-1999;STENT/PTCA--06/2002 Cardiac Risk Factors: Family History - CAD, History of Smoking, Hypertension, Lipids and NIDDM  Symptoms:  PT DENIES SYMPTOMS.   Nuclear Pre-Procedure Caffeine/Decaff Intake:  9:00pm NPO After: 7:00am   IV Site: R Antecubital  IV 0.9% NS with Angio Cath:  22g  Chest Size (in):  40"  IV Started by: Emmit Pomfret, RN  Height: 5\' 11"  (1.803 m)  Cup Size: n/a  BMI:  Body mass index is 21.91 kg/(m^2). Weight:  157 lb (71.215 kg)   Tech Comments:  N/A    Nuclear Med Study 1 or 2 day study: 1 day  Stress Test Type:  Stress  Order Authorizing Provider:  Nicki Guadalajara, MD   Resting Radionuclide: Technetium 4m Sestamibi  Resting Radionuclide Dose: 10.1 mCi   Stress Radionuclide:  Technetium 23m Sestamibi  Stress Radionuclide Dose: 30.7 mCi           Stress Protocol Rest HR: 74 Stress HR: 137  Rest BP: 171/83 Stress BP: 221/79  Exercise Time (min): 8 METS: 10.1   Predicted Max HR: 142 bpm % Max HR: 96.48 bpm Rate Pressure Product: 21308  Dose of Adenosine (mg):  n/a Dose of Lexiscan: n/a mg  Dose of Atropine (mg): n/a Dose of Dobutamine: n/a mcg/kg/min (at max HR)  Stress Test Technologist: Esperanza Sheets, CCT Nuclear Technologist: Koren Shiver, CNMT   Rest Procedure:  Myocardial perfusion imaging was performed at rest 45 minutes following the intravenous administration of Technetium 71m Sestamibi. Stress Procedure:  The patient performed treadmill exercise using a Bruce  Protocol for 8 minutes. The patient stopped due to SOB, Leg Fatigue and general fatigue and denied any  chest pain.  There were significant ST-T wave changes.  Technetium 68m Sestamibi was injected at peak exercise and myocardial perfusion imaging was performed after a brief delay.  Transient Ischemic Dilatation (Normal <1.22):  0.96 Lung/Heart Ratio (Normal <0.45):  0.27 QGS EDV:  69 ml QGS ESV:  19 ml LV Ejection Fraction: 72%  Rest ECG: NSR - Normal EKG  Stress ECG: Significant ST abnormalities consistent with ischemia.  QPS Raw Data Images:  Normal; no motion artifact; normal heart/lung ratio. Stress Images:  There is decreased uptake in the septum. Rest Images:  There is decreased uptake in the septum. Subtraction (SDS):  These findings are consistent with ischemia. in the septal territory. SDS 7. Extent 12%.  Impression Exercise Capacity:  Good exercise capacity. BP Response:  Hypertensive blood pressure response. Clinical Symptoms:  Leg discomfort, fatigue ECG Impression:  Significant ST abnormalities consistent with ischemia. Comparison with Prior Nuclear Study: New ischemia noted in the septum  Overall Impression:  Intermediate risk stress nuclear study with septal ischemia. 3-4 mm horizontal to downsloping ST depression in the inferior and lateral leads.  Marked hypertensive response to exercise.  LV Wall Motion:  EF 72%, mild septal hypokinesis  Daryl Nose, MD, [facility] Board Certified in Nuclear Cardiology Attending Cardiologist [facility]  Daryl Nose, MD  06/29/2013 10:17 AM

## 2013-07-08 ENCOUNTER — Encounter: Payer: Self-pay | Admitting: Cardiovascular Disease

## 2013-07-08 ENCOUNTER — Ambulatory Visit (INDEPENDENT_AMBULATORY_CARE_PROVIDER_SITE_OTHER): Payer: Medicare Other | Admitting: Cardiovascular Disease

## 2013-07-08 VITALS — BP 127/59 | HR 66 | Ht 70.5 in | Wt 155.6 lb

## 2013-07-08 DIAGNOSIS — Z79899 Other long term (current) drug therapy: Secondary | ICD-10-CM

## 2013-07-08 DIAGNOSIS — E785 Hyperlipidemia, unspecified: Secondary | ICD-10-CM

## 2013-07-08 DIAGNOSIS — I214 Non-ST elevation (NSTEMI) myocardial infarction: Secondary | ICD-10-CM

## 2013-07-08 DIAGNOSIS — I2510 Atherosclerotic heart disease of native coronary artery without angina pectoris: Secondary | ICD-10-CM

## 2013-07-08 DIAGNOSIS — E119 Type 2 diabetes mellitus without complications: Secondary | ICD-10-CM

## 2013-07-08 DIAGNOSIS — I10 Essential (primary) hypertension: Secondary | ICD-10-CM

## 2013-07-08 MED ORDER — FENOFIBRATE 145 MG PO TABS: 145.0000 mg | ORAL_TABLET | Freq: Every day | ORAL | Status: DC

## 2013-07-08 MED ORDER — FOLIC ACID 1 MG PO TABS: 1.0000 mg | ORAL_TABLET | Freq: Every day | ORAL | Status: DC

## 2013-07-08 MED ORDER — AMLODIPINE BESYLATE 10 MG PO TABS: 10.0000 mg | ORAL_TABLET | Freq: Every day | ORAL | Status: DC

## 2013-07-08 MED ORDER — CLOPIDOGREL BISULFATE 75 MG PO TABS: 75.0000 mg | ORAL_TABLET | Freq: Every day | ORAL | Status: DC

## 2013-07-08 MED ORDER — BENAZEPRIL HCL 40 MG PO TABS: 40.0000 mg | ORAL_TABLET | Freq: Every day | ORAL | Status: DC

## 2013-07-08 MED ORDER — METOPROLOL SUCCINATE ER 50 MG PO TB24: 50.0000 mg | ORAL_TABLET | Freq: Every day | ORAL | Status: DC

## 2013-07-08 MED ORDER — ISOSORBIDE MONONITRATE ER 30 MG PO TB24: 30.0000 mg | ORAL_TABLET | Freq: Every day | ORAL | Status: DC

## 2013-07-08 NOTE — Patient Instructions (Addendum)
Your physician recommends that you schedule a follow-up appointment in: 6 Months  Your physician has recommended you make the following change in your medication: Stop Brilinta and start Plavix 75 mg  Your physician recommends that you return for lab work in: P2Y12 blood work 2 weeks after you start taking Plavix

## 2013-07-08 NOTE — Progress Notes (Signed)
Patient ID: Daryl Joseph, male   DOB: 18-Sep-1932, 79 y.o.   MRN: [id number]      HPI: Daryl Joseph, is a 79 y.o. male who presents to the office today for 5 month cardiology evaluation.  Daryl Joseph has established coronary disease and in 1999 underwent CABG surgery with a LIMA to the LAD, vein graft to diagonal, sequential vein graft to the obtuse marginal distal circumflex coronary artery and vein graft to the RCA. 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 In November 2013 he suffered a non-ST segment elevation myocardial infarction and underwent initial catheterization by [name]. I ultimately performed intervention a high-grade ulcerated plaque graft stenosis supplying a diagonal vessel on 06/25/2012 and a 3.5x33 mm on its Xienc Xpedition DES stent was inserted and post dilated 3.9 mm. Once his graft was opened, the proximal LAD was now significantly improved with reference to being filled from the diagonal vessel. Patient has continued to do remarkably well. He denies any significant anginal symptomatology. He does note some issues with the mild blood pressure elevation and at times he has noted some very mild leg swelling.   Daryl Joseph underwent a one year followup nuclear perfusion study. He had held his metoprolol, amlodipine and isosorbide for the stress test. He did not develop any chest pain. However, he did have an exaggerated hypertensive blood pressure response to exercise and developed 3 mm ST segment depression. Scintigraphic images were low risk and specifically he had normal flow in the diagonal vessel territory. He says now for followup evaluation.  Daryl Joseph is concerned about the price of Brilinta.  He has  now been on this a year following his acute coronary syndrome. He also in the upcoming months will need dental work.  Past Medical History  Diagnosis Date  . CAD (coronary artery  disease) 03/25/2007    30 d monitor - sinus rhythm w/ some PACs  . Hypertension   . Hyperlipidemia   . Anginal pain   . NSTEMI (non-ST elevated myocardial infarction) 06/18/2012  . DM type 2 (diabetes mellitus, type 2)   . H/O hiatal hernia   . GERD (gastroesophageal reflux disease)   . Arthritis     "mostly in my hands" (06/25/2012)  . S/P angioplasty with stent, to VG -diag 1, LAD and septal perferator vessels  06/25/2012    Past Surgical History  Procedure Laterality Date  . Cervical disc surgery  1990's  . Cardiac catheterization  06/25/2012    3.5 x 33mm Xience Xpedition DES inserted in distal third of LAD  . Coronary angioplasty with stent placement  06/25/2012    "1; first one for me" (06/25/2012)  . Tonsillectomy and adenoidectomy      "when I was a kid" (06/25/2012)  . Coronary artery bypass graft  10/1997    CABG X 4 LIMA to LAD; vein to diagonal; sequential vein to obtuse marginal and distal circumflex and vein to RCA  . Doppler echocardiography  09/23/2011    EF >55%; proximal septal thickening noted; mild septal hypokinesis; ; mild/mod tricuspid regurgitation; aortic root sclerosis/calcification  . Cardiovascular stress test  11/05/2010    R/S MV - EF 73%; post stress LV fcn normal, no significant wall motion abnormalities; perfusion defect in inferior myocardial region consistent w/ diaphragmatic attenuation, remaining myocardium demonstrates normal perfusion; Exercise capacity 10 METS; stress EKG showed changes from baseline EKG; study results unchanged/within normal variance  of last study (09/2008)    Allergies  Allergen Reactions  . Statins Other (See Comments)    Myositis with statins "affected my liver; I almost had Hepatitis" (06/25/2012)  . Welchol [Colesevelam]     Current Outpatient Prescriptions  Medication Sig Dispense Refill  . amLODipine (NORVASC) 10 MG tablet Take 1 tablet (10 mg total) by mouth daily.  30 tablet  6  . aspirin 81 MG tablet Take 81 mg by  mouth daily.      . benazepril (LOTENSIN) 40 MG tablet Take 1 tablet (40 mg total) by mouth daily.  30 tablet  6  . ezetimibe (ZETIA) 10 MG tablet Take 10 mg by mouth at bedtime.      . fenofibrate (TRICOR) 145 MG tablet Take 145 mg by mouth daily.      . folic acid (FOLVITE) 1 MG tablet Take 1 mg by mouth daily.      . isosorbide mononitrate (IMDUR) 30 MG 24 hr tablet Take 1 tablet (30 mg total) by mouth daily.  30 tablet  6  . metFORMIN (GLUCOPHAGE) 500 MG tablet Take 500 mg by mouth 2 (two) times daily with a meal. 2 tablets      . metoprolol succinate (TOPROL-XL) 50 MG 24 hr tablet Take 1 tablet (50 mg total) by mouth daily. Take with or immediately following a meal.  30 tablet  6  . Multiple Vitamins-Minerals (EYE VITAMINS PO) Take 1 tablet by mouth every evening.      . nitroGLYCERIN (NITROSTAT) 0.4 MG SL tablet Place 0.4 mg under the tongue every 5 (five) minutes as needed for chest pain.      Marland Kitchen omeprazole (PRILOSEC) 20 MG capsule Take 20 mg by mouth daily.      . Ticagrelor (BRILINTA) 90 MG TABS tablet Take 1 tablet (90 mg total) by mouth 2 (two) times daily.  60 tablet  11   No current facility-administered medications for this visit.    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 He is married with no children. He does not routinely exercise. There is no tobacco or alcohol use. 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  ROS is negative for fevers, chills or night sweats. He does admit to several lipomas. No Skin rash. He denies visual changes. There is no wheezing he denies cough or increased sputum production. He denies chest pressure. At times is very mild shortness of breath. He is unaware of palpitations. He denies presyncope or syncope. He denies any chest tightness or his prior anginal equivalent discomfort He denies bleeding but does note easy bruisability.Marland Kitchen He denies change in GU symptoms. He denies significant leg swelling at times does have some trace  edema. He does have type 2 diabetes mellitus. There's no cold or heat intolerance.  Other comprehensive 14 system review is negative.  PE BP 127/59  Pulse 66  Ht 5' 10.5" (1.791 m)  Wt 70.58 kg (155 lb 9.6 oz)  BMI 22.00 kg/m2  General: Alert, oriented, no distress.  Skin: normal turgor, no rashes. He does have a cyst/lipoma on the back, and also in the right lateral area IV belt line, and possibly an early 1 in the right bicep region HEENT: Normocephalic, atraumatic. Pupils round and reactive; sclera anicteric;no lid lag.  Nose without nasal septal hypertrophy Mouth/Parynx benign; Mallinpatti scale 2 Neck: No JVD, no carotid briuts Lungs: clear to ausculatation and percussion; no wheezing or rales Heart: RRR, s1 s2 normal  1-2/ 6 systolic murmur, unchanged Abdomen: Diastases recti ; soft, nontender; no hepatosplenomehaly, BS+; abdominal aorta nontender and not dilated by palpation. Pulses 2+ Extremities: no clubbing cyanosis or edema, Homan's sign negative  Neurologic: grossly nonfocal Psychological: Normal affect and mood   LABS:  BMET    Component Value Date/Time   NA 140 06/26/2012 0515   K 4.0 06/26/2012 0515   CL 106 06/26/2012 0515   CO2 24 06/26/2012 0515   GLUCOSE 132* 06/26/2012 0515   BUN 13 06/26/2012 0515   CREATININE 0.79 06/26/2012 0515   CALCIUM 9.3 06/26/2012 0515   GFRNONAA 84* 06/26/2012 0515   GFRAA >90 06/26/2012 0515     Hepatic Function Panel     Component Value Date/Time   PROT 6.7 06/21/2012 0925   ALBUMIN 3.5 06/21/2012 0925   AST 34 06/21/2012 0925   ALT 27 06/21/2012 0925   ALKPHOS 65 06/21/2012 0925   BILITOT 0.4 06/21/2012 0925   BILIDIR <0.1 06/21/2012 0925   IBILI NOT CALCULATED 06/21/2012 0925     CBC    Component Value Date/Time   WBC 7.7 06/26/2012 0515   RBC 4.50 06/26/2012 0515   HGB 13.4 06/26/2012 0515   HCT 38.7* 06/26/2012 0515   PLT 187 06/26/2012 0515   MCV 86.0 06/26/2012 0515   MCH 29.8 06/26/2012 0515   MCHC  34.6 06/26/2012 0515   RDW 13.5 06/26/2012 0515     BNP    Component Value Date/Time   PROBNP 422.5 06/19/2012 0931    Lipid Panel     Component Value Date/Time   CHOL 208* 06/19/2012 0114   TRIG 153* 06/19/2012 0114   HDL 62 06/19/2012 0114   CHOLHDL 3.4 06/19/2012 0114   VLDL 31 06/19/2012 0114   LDLCALC 115* 06/19/2012 0114     RADIOLOGY: No results found.    ASSESSMENT AND PLAN: Mr. Waltner is 15 years status post CABG revascularization surgery. In  November 2013 he suffered a non-ST segment elevation myocardial infarction secondary to a high-grade stenosis in the graft supplying his diagonal vessel which was successfully intervened on by me upon return from my daughter's wedding. When I last saw him in July, his blood pressure was L. date and I further titrated his Lotensin to 40 mg daily. He recently had a one-year followup nuclear perfusion study which remains low risk. However, his blood pressure response was exaggerated which most likely was contributed by the fact that he had felt several of his medications. He remained symptom free. He continues to be very active and does strenuous work. He is concerned about the cost of staying on Brilinta. Since he now has been on this for one year which was the duration of the Plato trial, I will change him to oral Plavix which can be generic. After 2 weeks of therapy we will check a P2Y12 test to assess Plavix responsiveness. If he is not responsive we will switch him back to Brilinta.. He will  need to hold his Plavix for 5 days prior to any dental work if all surgery is necessary. As long as he remains stable I will see him in 6 months for cardiology reevaluation.    Lennette Bihari, MD, [employer]  07/08/2013 10:52 AM

## 2013-07-11 ENCOUNTER — Encounter: Payer: Self-pay | Admitting: Cardiovascular Disease

## 2013-07-11 ENCOUNTER — Other Ambulatory Visit: Payer: Self-pay | Admitting: Cardiovascular Disease

## 2013-08-01 ENCOUNTER — Ambulatory Visit (HOSPITAL_COMMUNITY)
Admission: RE | Admit: 2013-08-01 | Discharge: 2013-08-01 | Disposition: A | Discharge status: Home / Self Care | Payer: Medicare Other | Source: Ambulatory Visit | Attending: Cardiovascular Disease | Admitting: Cardiovascular Disease

## 2013-08-01 DIAGNOSIS — Z5181 Encounter for therapeutic drug level monitoring: Secondary | ICD-10-CM | POA: Insufficient documentation

## 2013-12-01 ENCOUNTER — Telehealth: Payer: Self-pay | Admitting: Cardiovascular Disease

## 2013-12-09 NOTE — Telephone Encounter (Signed)
Closed encounter °

## 2014-01-14 ENCOUNTER — Telehealth: Payer: Self-pay | Admitting: Cardiovascular Disease

## 2014-01-16 NOTE — Telephone Encounter (Signed)
Closed enounter °

## 2014-01-23 ENCOUNTER — Ambulatory Visit (INDEPENDENT_AMBULATORY_CARE_PROVIDER_SITE_OTHER): Payer: Medicare Other | Admitting: Cardiovascular Disease

## 2014-01-23 ENCOUNTER — Other Ambulatory Visit: Payer: Self-pay

## 2014-01-23 ENCOUNTER — Encounter: Payer: Self-pay | Admitting: Cardiovascular Disease

## 2014-01-23 VITALS — BP 122/70 | HR 63 | Ht 71.0 in | Wt 154.1 lb

## 2014-01-23 DIAGNOSIS — I10 Essential (primary) hypertension: Secondary | ICD-10-CM

## 2014-01-23 DIAGNOSIS — E785 Hyperlipidemia, unspecified: Secondary | ICD-10-CM

## 2014-01-23 DIAGNOSIS — Z9889 Other specified postprocedural states: Secondary | ICD-10-CM

## 2014-01-23 DIAGNOSIS — I2510 Atherosclerotic heart disease of native coronary artery without angina pectoris: Secondary | ICD-10-CM

## 2014-01-23 DIAGNOSIS — Z95820 Peripheral vascular angioplasty status with implants and grafts: Secondary | ICD-10-CM

## 2014-01-23 DIAGNOSIS — K219 Gastro-esophageal reflux disease without esophagitis: Secondary | ICD-10-CM | POA: Insufficient documentation

## 2014-01-23 MED ORDER — ISOSORBIDE MONONITRATE ER 30 MG PO TB24: 30.0000 mg | ORAL_TABLET | Freq: Every day | ORAL | Status: DC

## 2014-01-23 MED ORDER — FENOFIBRATE 145 MG PO TABS: 145.0000 mg | ORAL_TABLET | Freq: Every day | ORAL | Status: DC

## 2014-01-23 MED ORDER — AMLODIPINE BESYLATE 10 MG PO TABS: 10.0000 mg | ORAL_TABLET | Freq: Every day | ORAL | Status: DC

## 2014-01-23 MED ORDER — METOPROLOL SUCCINATE ER 50 MG PO TB24: 50.0000 mg | ORAL_TABLET | Freq: Every day | ORAL | Status: DC

## 2014-01-23 MED ORDER — CLOPIDOGREL BISULFATE 75 MG PO TABS: 75.0000 mg | ORAL_TABLET | Freq: Every day | ORAL | Status: DC

## 2014-01-23 MED ORDER — EZETIMIBE 10 MG PO TABS
10.0000 mg | ORAL_TABLET | Freq: Every day | ORAL | Status: DC
Start: 2014-01-23 — End: 2016-07-18

## 2014-01-23 MED ORDER — BENAZEPRIL HCL 40 MG PO TABS: 40.0000 mg | ORAL_TABLET | Freq: Every day | ORAL | Status: DC

## 2014-01-23 MED ORDER — FOLIC ACID 1 MG PO TABS: 1.0000 mg | ORAL_TABLET | Freq: Every day | ORAL | Status: DC

## 2014-01-23 NOTE — Progress Notes (Signed)
Patient ID: Daryl Joseph, male   DOB: Mar 11, 1933, 80 y.o.   MRN: [id number]      HPI: Daryl Joseph is a 80 y.o. male who presents to the office today for 7 month cardiology evaluation.  Daryl Joseph has established coronary disease and in 1999 underwent CABG surgery with a LIMA to the LAD, vein graft to diagonal, sequential vein graft to the obtuse marginal distal circumflex coronary artery and vein graft to the RCA. 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 In November 2013 he suffered a non-ST segment elevation myocardial infarction and underwent initial catheterization by [name]. I ultimately performed intervention a high-grade ulcerated plaque graft stenosis supplying a diagonal vessel on 06/25/2012 and a 3.5x33 mm on its Xienc Xpedition DES stent was inserted and post dilated 3.9 mm. Once his graft was opened, the proximal LAD was now significantly improved with reference to being filled from the diagonal vessel. Patient has continued to do remarkably well. He denies any significant anginal symptomatology. He does note some issues with the mild blood pressure elevation and at times he has noted some very mild leg swelling.   Daryl Joseph underwent a one year followup nuclear perfusion study. He had held his metoprolol, amlodipine and isosorbide for the stress test. He did not develop any chest pain. However, he did have an exaggerated hypertensive blood pressure response to exercise and developed 3 mm ST segment depression. Scintigraphic images were low risk and specifically he had normal flow in the diagonal vessel territory.   Saw him in December 2014, he was concerned about the price of Brilinta.  Since his been 13 months since his ACS intervention, I switched him to Plavix and P2Y12 testing verifiend plavix sensitivity.  Over the past 7 months, he has continued to do well.  He denies any recurrent anginal  symptoms.  He denies change in exercise tolerance.  He denies PND or orthopnea.  He denies palpitations.  He has been on fenofibrate and Zetia for hyperlipidemia in light of his statin hypersensitivity.  I have reviewed laboratory, which he had done in March 2015.  Cholesterol was 177, triglycerides 68, HDL 68, LDL 95.  Hemoglobin A1c was 7.0.  He went 21, creatinine 0.88.  Normal LFTs.  Past Medical History  Diagnosis Date  . CAD (coronary artery disease) 03/25/2007    30 d monitor - sinus rhythm w/ some PACs  . Hypertension   . Hyperlipidemia   . Anginal pain   . NSTEMI (non-ST elevated myocardial infarction) 06/18/2012  . DM type 2 (diabetes mellitus, type 2)   . H/O hiatal hernia   . GERD (gastroesophageal reflux disease)   . Arthritis     "mostly in my hands" (06/25/2012)  . S/P angioplasty with stent, to VG -diag 1, LAD and septal perferator vessels  06/25/2012    Past Surgical History  Procedure Laterality Date  . Cervical disc surgery  1990's  . Cardiac catheterization  06/25/2012    3.5 x 14mm Xience Xpedition DES inserted in distal third of LAD  . Coronary angioplasty with stent placement  06/25/2012    "1; first one for me" (06/25/2012)  . Tonsillectomy and adenoidectomy      "when I was a kid" (06/25/2012)  . Coronary artery bypass graft  10/1997    CABG X 4 LIMA to LAD; vein to diagonal; sequential vein to obtuse marginal and distal circumflex and vein  to RCA  . Doppler echocardiography  09/23/2011    EF >55%; proximal septal thickening noted; mild septal hypokinesis; ; mild/mod tricuspid regurgitation; aortic root sclerosis/calcification  . Cardiovascular stress test  11/05/2010    R/S MV - EF 73%; post stress LV fcn normal, no significant wall motion abnormalities; perfusion defect in inferior myocardial region consistent w/ diaphragmatic attenuation, remaining myocardium demonstrates normal perfusion; Exercise capacity 10 METS; stress EKG showed changes from baseline EKG;  study results unchanged/within normal variance of last study (09/2008)    Allergies  Allergen Reactions  . Statins Other (See Comments)    Myositis with statins "affected my liver; I almost had Hepatitis" (06/25/2012)  . Welchol [Colesevelam]     Current Outpatient Prescriptions  Medication Sig Dispense Refill  . amLODipine (NORVASC) 10 MG tablet Take 1 tablet (10 mg total) by mouth daily.  30 tablet  6  . aspirin 81 MG tablet Take 81 mg by mouth daily.      . benazepril (LOTENSIN) 40 MG tablet Take 1 tablet (40 mg total) by mouth daily.  30 tablet  6  . clopidogrel (PLAVIX) 75 MG tablet Take 1 tablet (75 mg total) by mouth daily.  30 tablet  6  . ezetimibe (ZETIA) 10 MG tablet Take 10 mg by mouth at bedtime.      . fenofibrate (TRICOR) 145 MG tablet Take 1 tablet (145 mg total) by mouth daily.  30 tablet  6  . folic acid (FOLVITE) 1 MG tablet Take 1 tablet (1 mg total) by mouth daily.  30 tablet  6  . isosorbide mononitrate (IMDUR) 30 MG 24 hr tablet Take 1 tablet (30 mg total) by mouth daily.  30 tablet  6  . metFORMIN (GLUCOPHAGE) 500 MG tablet Take 500 mg by mouth 2 (two) times daily with a meal. 2 tablets      . metoprolol succinate (TOPROL-XL) 50 MG 24 hr tablet Take 1 tablet (50 mg total) by mouth daily. Take with or immediately following a meal.  30 tablet  6  . Multiple Vitamins-Minerals (EYE VITAMINS PO) Take 1 tablet by mouth every evening.      Marland Kitchen NITROSTAT 0.4 MG SL tablet PLACE 1 TABLET UNDER THE TONGUE EVERY 5 MINUTES UP TO  3 DOSES ASNEEDED  FOR CHEST PAIN.  25 tablet  2  . omeprazole (PRILOSEC) 20 MG capsule Take 20 mg by mouth daily.       No current facility-administered medications for this visit.    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 He is married with no children. He does not routinely exercise. There is no tobacco or alcohol use. 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  ROS General: Negative; No fevers, chills, or night  sweats;  HEENT: Negative; No changes in vision or hearing, sinus congestion, difficulty swallowing Pulmonary: Negative; No cough, wheezing, shortness of breath, hemoptysis Cardiovascular: Negative; No chest pain, presyncope, syncope, palpatations GI: Negative; No nausea, vomiting, diarrhea, or abdominal pain GU: Positive for history of BPH; No dysuria, hematuria, or difficulty voiding Musculoskeletal: Negative; no myalgias, joint pain, or weakness Hematologic/Oncology: Positive for easy bruisability secondary to antiplatelet therapy Endocrine: Positive for type 2 diabetes mellitus; no heat/cold intolerance;  Neuro: Negative; no changes in balance, headaches Skin: Negative; No rashes or skin lesions Psychiatric: Negative; No behavioral problems, depression Sleep: Negative; No snoring, daytime sleepiness, hypersomnolence, bruxism, restless legs, hypnogognic hallucinations, no cataplexy Other comprehensive 14 point system review is negative.  PE BP 122/70  Pulse 63  Ht 5\' 11"  (1.803 m)  Wt 154 lb 1.6 oz (69.899 kg)  BMI 21.50 kg/m2  General: Alert, oriented, no distress.  Skin: normal turgor, no rashes. He does have a cyst/lipoma on the back, and also in the right lateral area near the  belt line, and possibly an early 1 in the right bicep region HEENT: Normocephalic, atraumatic. Pupils round and reactive; sclera anicteric;no lid lag.  Nose without nasal septal hypertrophy Mouth/Parynx benign; Mallinpatti scale 2 Neck: No JVD, no carotid bruits with normal carotid upstroke Chest wall: Nontender to palpation Lungs: clear to ausculatation and percussion; no wheezing or rales Heart: RRR, s1 s2 normal 4-0/ 6 systolic murmur, unchanged; no S3 or S4 gallop.  No diastolic murmur, rubs, thrills or heaves to Abdomen: Diastases recti ; soft, nontender; no hepatosplenomehaly, BS+; abdominal aorta nontender and not dilated by palpation. Pulses 2+ Extremities: no clubbing cyanosis or edema, Homan's  sign negative  Neurologic: grossly nonfocal Psychological: Normal affect and mood   ECG: Normal sinus rhythm at 63 beats per minute.  First degree AV block with PR interval at 236 ms.  No significant ST changes.  LABS:  BMET    Component Value Date/Time   NA 140 06/26/2012 0515   K 4.0 06/26/2012 0515   CL 106 06/26/2012 0515   CO2 24 06/26/2012 0515   GLUCOSE 132* 06/26/2012 0515   BUN 13 06/26/2012 0515   CREATININE 0.79 06/26/2012 0515   CALCIUM 9.3 06/26/2012 0515   GFRNONAA 84* 06/26/2012 0515   GFRAA >90 06/26/2012 0515     Hepatic Function Panel     Component Value Date/Time   PROT 6.7 06/21/2012 0925   ALBUMIN 3.5 06/21/2012 0925   AST 34 06/21/2012 0925   ALT 27 06/21/2012 0925   ALKPHOS 65 06/21/2012 0925   BILITOT 0.4 06/21/2012 0925   BILIDIR <0.1 06/21/2012 0925   IBILI NOT CALCULATED 06/21/2012 0925     CBC    Component Value Date/Time   WBC 7.7 06/26/2012 0515   RBC 4.50 06/26/2012 0515   HGB 13.4 06/26/2012 0515   HCT 38.7* 06/26/2012 0515   PLT 187 06/26/2012 0515   MCV 86.0 06/26/2012 0515   MCH 29.8 06/26/2012 0515   MCHC 34.6 06/26/2012 0515   RDW 13.5 06/26/2012 0515     BNP    Component Value Date/Time   PROBNP 422.5 06/19/2012 0931    Lipid Panel     Component Value Date/Time   CHOL 208* 06/19/2012 0114   TRIG 153* 06/19/2012 0114   HDL 62 06/19/2012 0114   CHOLHDL 3.4 06/19/2012 0114   VLDL 31 06/19/2012 0114   LDLCALC 115* 06/19/2012 0114     RADIOLOGY: No results found.    ASSESSMENT AND PLAN: Mr. Kniskern is 18 years status post CABG revascularization surgery. In  November 2013 he suffered a non-ST segment elevation myocardial infarction secondary to a high-grade stenosis in the graft supplying his diagonal vessel which was successfully intervened on by me upon return from my daughter's wedding.  He now is on Plavix and aspirin for dual antiplatelet therapy initially being treated with Brilinta for 13 months following  his ACS.  He does note mild bruisability.  He is not having recurrent anginal symptoms.  His exercise tolerance is preserved.  Recent laboratory has shown a good lipid lowering therapy with combination fenofibrate Zetia as he develop significant LFT elevation.  In 1987 when he was started on Mevacor.  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  He continues to be on an interpersonal for GERD symptoms, which is controlled.  His blood pressure today is excellent on amlodipine 10 mg, benazepril 40 mg addition to his Toprol-XL 50 mg daily.  I will see him in 6 months for cardiology reevaluation or sooner if problems arise.  Troy Sine, MD, [facility]  01/23/2014 8:32 AM

## 2014-01-23 NOTE — Patient Instructions (Signed)
Your physician recommends that you schedule a follow-up appointment in: 6 months. No changes were made today in your therapy. 

## 2014-01-23 NOTE — Telephone Encounter (Signed)
Rx was sent to pharmacy electronically. 

## 2014-02-01 ENCOUNTER — Ambulatory Visit: Payer: Medicare Other | Admitting: Cardiovascular Disease

## 2014-07-13 ENCOUNTER — Encounter (HOSPITAL_COMMUNITY): Payer: Self-pay | Admitting: Cardiovascular Disease

## 2014-07-31 ENCOUNTER — Encounter: Payer: Self-pay | Admitting: Cardiovascular Disease

## 2014-07-31 ENCOUNTER — Ambulatory Visit (INDEPENDENT_AMBULATORY_CARE_PROVIDER_SITE_OTHER): Payer: Medicare Other | Admitting: Cardiovascular Disease

## 2014-07-31 VITALS — BP 115/60 | HR 60 | Ht 70.0 in | Wt 154.7 lb

## 2014-07-31 DIAGNOSIS — Z8679 Personal history of other diseases of the circulatory system: Secondary | ICD-10-CM

## 2014-07-31 DIAGNOSIS — E785 Hyperlipidemia, unspecified: Secondary | ICD-10-CM | POA: Insufficient documentation

## 2014-07-31 DIAGNOSIS — Z889 Allergy status to unspecified drugs, medicaments and biological substances status: Secondary | ICD-10-CM

## 2014-07-31 DIAGNOSIS — D689 Coagulation defect, unspecified: Secondary | ICD-10-CM

## 2014-07-31 DIAGNOSIS — I10 Essential (primary) hypertension: Secondary | ICD-10-CM

## 2014-07-31 DIAGNOSIS — Z789 Other specified health status: Secondary | ICD-10-CM | POA: Insufficient documentation

## 2014-07-31 DIAGNOSIS — Z01818 Encounter for other preprocedural examination: Secondary | ICD-10-CM

## 2014-07-31 DIAGNOSIS — I25118 Atherosclerotic heart disease of native coronary artery with other forms of angina pectoris: Secondary | ICD-10-CM

## 2014-07-31 DIAGNOSIS — Z79899 Other long term (current) drug therapy: Secondary | ICD-10-CM

## 2014-07-31 LAB — COMPREHENSIVE METABOLIC PANEL
ALT: 20 U/L (ref 0–53)
AST: 18 U/L (ref 0–37)
Albumin: 4.7 g/dL (ref 3.5–5.2)
Alkaline Phosphatase: 50 U/L (ref 39–117)
BUN: 19 mg/dL (ref 6–23)
CO2: 29 mEq/L (ref 19–32)
CREATININE: 0.9 mg/dL (ref 0.50–1.35)
Calcium: 10.2 mg/dL (ref 8.4–10.5)
Chloride: 104 mEq/L (ref 96–112)
Glucose, Bld: 98 mg/dL (ref 70–99)
Potassium: 4.9 mEq/L (ref 3.5–5.3)
Sodium: 143 mEq/L (ref 135–145)
TOTAL PROTEIN: 7 g/dL (ref 6.0–8.3)
Total Bilirubin: 0.5 mg/dL (ref 0.2–1.2)

## 2014-07-31 LAB — CBC
HCT: 43.5 % (ref 39.0–52.0)
Hemoglobin: 14.7 g/dL (ref 13.0–17.0)
MCH: 29.2 pg (ref 26.0–34.0)
MCHC: 33.8 g/dL (ref 30.0–36.0)
MCV: 86.3 fL (ref 78.0–100.0)
MPV: 10.5 fL (ref 9.4–12.4)
Platelets: 224 10*3/uL (ref 150–400)
RBC: 5.04 MIL/uL (ref 4.22–5.81)
RDW: 14.7 % (ref 11.5–15.5)
WBC: 8 10*3/uL (ref 4.0–10.5)

## 2014-07-31 LAB — LIPID PANEL
Cholesterol: 187 mg/dL (ref 0–200)
HDL: 59 mg/dL (ref >39)
LDL CALC: 88 mg/dL (ref 0–99)
Total CHOL/HDL Ratio: 3.2 Ratio
Triglycerides: 199 mg/dL — ABNORMAL HIGH (ref <150)
VLDL: 40 mg/dL (ref 0–40)

## 2014-07-31 NOTE — Progress Notes (Addendum)
Patient ID: Daryl Joseph, male   DOB: February 21, 1933, 80 y.o.   MRN: [id number]      HPI: Daryl Joseph is a 80 y.o. male who presents to the office today for 6 month cardiology evaluation with a chief complaint of a two-week history of recurrent exertional chest tightness.  Daryl Joseph has established coronary disease and in 1999 underwent CABG surgery with a LIMA to the LAD, vein graft to diagonal, sequential vein graft to the obtuse marginal distal circumflex coronary artery and vein graft to the RCA. 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 In November 2013 Daryl Joseph suffered a non-ST segment elevation myocardial infarction and underwent initial catheterization by [name]. I ultimately performed intervention a high-grade ulcerated plaque graft stenosis supplying a diagonal vessel on 06/25/2012 and a 3.5x33 mm  Xience Xpedition DES stent was inserted and post dilated 3.9 mm. Once his graft was opened, the proximal LAD was now significantly improved with reference to being filled from the diagonal vessel.    On a one year followup nuclear perfusion study Daryl Joseph had held his metoprolol, amlodipine and isosorbide for the stress test. Daryl Joseph did not develop any chest pain. However, Daryl Joseph did have an exaggerated hypertensive blood pressure response to exercise and developed 3 mm ST segment depression. Scintigraphic images were low risk and specifically Daryl Joseph had normal flow in the diagonal vessel territory.   In December 2014, Daryl Joseph was concerned about the price of Brilinta.  Since his been 13 months since his ACS intervention, I switched him to Plavix and P2Y12 testing verifiend plavix sensitivity.   Daryl Joseph has been on fenofibrate and Zetia for hyperlipidemia in light of his statin hypersensitivity.  Laboratory in March 2015 revealed a cholesterol was 177, triglycerides 68, HDL 68, LDL 95.  Hemoglobin A1c was 7.0.  Daryl Joseph went 21, creatinine  0.88.  Normal LFTs.  Daryl Joseph brought with him.  Laboratory which was done in [place], [place] on 04/18/2014.  I reviewed this in detail with him.  Daryl Joseph had normal thyroid function studies.  Hemoglobin 14.8, hematocrit 44.4.  Glucose was 142.  LDL cholesterol was 81 with total cholesterol 165, HDL 67, triglycerides 83.  Hemoglobin A1c was increased at 7.4.  PSA was 2.32.  Approximate 2 weeks ago while walking up a hill.  Daryl Joseph developed recurrent chest tightness and increasing shortness of breath.  Daryl Joseph took a nitroglycerin and noted fairly prompt relief.  Last week Daryl Joseph again develop recurrent chest tightness and had to stop on several occasions because of recurrent chest tightness.  Daryl Joseph did experience shortness of breath.  Daryl Joseph denied pain radiation, nausea or vomiting.  Daryl Joseph has been fairly inactive since.  Daryl Joseph presents to the office today for evaluation.  Past Medical History  Diagnosis Date  . CAD (coronary artery disease) 03/25/2007    30 d monitor - sinus rhythm w/ some PACs  . Hypertension   . Hyperlipidemia   . Anginal pain   . NSTEMI (non-ST elevated myocardial infarction) 06/18/2012  . DM type 2 (diabetes mellitus, type 2)   . H/O hiatal hernia   . GERD (gastroesophageal reflux disease)   . Arthritis     "mostly in my hands" (06/25/2012)  . S/P angioplasty with stent, to VG -diag 1, LAD and septal perferator vessels  06/25/2012    Past Surgical History  Procedure Laterality Date  . Cervical disc surgery  1990's  . Cardiac catheterization  06/25/2012    3.5 x 48mm Xience Xpedition DES inserted in distal third of LAD  . Coronary angioplasty with stent placement  06/25/2012    "1; first one for me" (06/25/2012)  . Tonsillectomy and adenoidectomy      "when I was a kid" (06/25/2012)  . Coronary artery bypass graft  10/1997    CABG X 4 LIMA to LAD; vein to diagonal; sequential vein to obtuse marginal and distal circumflex and vein to RCA  . Doppler echocardiography  09/23/2011    EF >55%;  proximal septal thickening noted; mild septal hypokinesis; ; mild/mod tricuspid regurgitation; aortic root sclerosis/calcification  . Cardiovascular stress test  11/05/2010    R/S MV - EF 73%; post stress LV fcn normal, no significant wall motion abnormalities; perfusion defect in inferior myocardial region consistent w/ diaphragmatic attenuation, remaining myocardium demonstrates normal perfusion; Exercise capacity 10 METS; stress EKG showed changes from baseline EKG; study results unchanged/within normal variance of last study (09/2008)  . Left heart catheterization with coronary/graft angiogram N/A 06/21/2012    Procedure: LEFT HEART CATHETERIZATION WITH Beatrix Fetters;  Surgeon: Sanda Klein, MD;  Location: [facility] CATH LAB;  Service: Cardiovascular;  Laterality: N/A;  . Percutaneous coronary stent intervention (pci-s) N/A 06/25/2012    Procedure: PERCUTANEOUS CORONARY STENT INTERVENTION (PCI-S);  Surgeon: Troy Sine, MD;  Location: [facility] CATH LAB;  Service: Cardiovascular;  Laterality: N/A;    Allergies  Allergen Reactions  . Statins Other (See Comments)    Myositis with statins "affected my liver; I almost had Hepatitis" (06/25/2012)  . Welchol [Colesevelam]     Current Outpatient Prescriptions  Medication Sig Dispense Refill  . amLODipine (NORVASC) 10 MG tablet Take 1 tablet (10 mg total) by mouth daily. 90 tablet 3  . aspirin 81 MG tablet Take 81 mg by mouth daily.    . benazepril (LOTENSIN) 40 MG tablet Take 1 tablet (40 mg total) by mouth daily. 90 tablet 3  . clopidogrel (PLAVIX) 75 MG tablet Take 1 tablet (75 mg total) by mouth daily. 90 tablet 3  . ezetimibe (ZETIA) 10 MG tablet Take 1 tablet (10 mg total) by mouth at bedtime. 90 tablet 3  . fenofibrate (TRICOR) 145 MG tablet Take 1 tablet (145 mg total) by mouth daily. 90 tablet 3  . folic acid (FOLVITE) 1 MG tablet Take 1 tablet (1 mg total) by mouth daily. 90 tablet 3  . isosorbide mononitrate (IMDUR) 30 MG 24 hr tablet  Take 1 tablet (30 mg total) by mouth daily. 90 tablet 3  . metFORMIN (GLUCOPHAGE) 500 MG tablet Take 500 mg by mouth 2 (two) times daily with a meal. 2 tablets    . metoprolol succinate (TOPROL-XL) 50 MG 24 hr tablet Take 1 tablet (50 mg total) by mouth daily. Take with or immediately following a meal. 90 tablet 3  . Multiple Vitamins-Minerals (EYE VITAMINS PO) Take 1 tablet by mouth every evening.    Marland Kitchen NITROSTAT 0.4 MG SL tablet PLACE 1 TABLET UNDER THE TONGUE EVERY 5 MINUTES UP TO  3 DOSES ASNEEDED  FOR CHEST PAIN. 25 tablet 2  . omeprazole (PRILOSEC) 20 MG capsule Take 20 mg by mouth daily.     No current facility-administered medications for this visit.    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 Daryl Joseph is married with no children. Daryl Joseph does not routinely exercise. There is no tobacco or alcohol use. 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  ROS  General: Negative; No fevers, chills, or night sweats;  HEENT: Negative; No changes in vision or hearing, sinus congestion, difficulty swallowing Pulmonary: Negative; No cough, wheezing, shortness of breath, hemoptysis Cardiovascular: See history of present illness GI: Negative; No nausea, vomiting, diarrhea, or abdominal pain GU: Positive for history of BPH; No dysuria, hematuria, or difficulty voiding Musculoskeletal: Negative; no myalgias, joint pain, or weakness Hematologic/Oncology: Positive for easy bruisability secondary to antiplatelet therapy Endocrine: Positive for type 2 diabetes mellitus; no heat/cold intolerance;  Neuro: Negative; no changes in balance, headaches Skin: Negative; No rashes or skin lesions Psychiatric: Negative; No behavioral problems, depression Sleep: Negative; No snoring, daytime sleepiness, hypersomnolence, bruxism, restless legs, hypnogognic hallucinations, no cataplexy Other comprehensive 14 point system review is negative.   PE BP 115/60 mmHg  Pulse 60  Ht 5\' 10"  (1.778 m)  Wt  154 lb 11.2 oz (70.171 kg)  BMI 22.20 kg/m2  General: Alert, oriented, no distress.  Skin: normal turgor, no rashes. Daryl Joseph does have a cyst/lipoma on the back, and also in the right lateral area near the  belt line, and possibly an early 1 in the right bicep region HEENT: Normocephalic, atraumatic. Pupils round and reactive; sclera anicteric;no lid lag.  Nose without nasal septal hypertrophy Mouth/Parynx benign; Mallinpatti scale 2 Neck: No JVD, no carotid bruits with normal carotid upstroke Chest wall: Nontender to palpation Lungs: clear to ausculatation and percussion; no wheezing or rales Heart: RRR, s1 s2 normal 2/ 6 systolic murmur, unchanged; no S3 or S4 gallop.  No diastolic murmur, rubs, thrills or heaves to Abdomen: Diastases recti ; soft, nontender; no hepatosplenomehaly, BS+; abdominal aorta nontender and not dilated by palpation. Pulses 2+ Extremities: no clubbing cyanosis or edema, Homan's sign negative  Neurologic: grossly nonfocal Psychological: Normal affect and mood  ECG (independently read by me): Sinus rhythm with first-degree AV block with a PR interval at 244 ms.  No significant ST segment changes.  Prior June 2015 ECG: Normal sinus rhythm at 63 beats per minute.  First degree AV block with PR interval at 236 ms.  No significant ST changes.  LABS:  BMET    Component Value Date/Time   NA 140 06/26/2012 0515   K 4.0 06/26/2012 0515   CL 106 06/26/2012 0515   CO2 24 06/26/2012 0515   GLUCOSE 132* 06/26/2012 0515   BUN 13 06/26/2012 0515   CREATININE 0.79 06/26/2012 0515   CALCIUM 9.3 06/26/2012 0515   GFRNONAA 84* 06/26/2012 0515   GFRAA >90 06/26/2012 0515     Hepatic Function Panel     Component Value Date/Time   PROT 6.7 06/21/2012 0925   ALBUMIN 3.5 06/21/2012 0925   AST 34 06/21/2012 0925   ALT 27 06/21/2012 0925   ALKPHOS 65 06/21/2012 0925   BILITOT 0.4 06/21/2012 0925   BILIDIR <0.1 06/21/2012 0925   IBILI NOT CALCULATED 06/21/2012 0925      CBC    Component Value Date/Time   WBC 7.7 06/26/2012 0515   RBC 4.50 06/26/2012 0515   HGB 13.4 06/26/2012 0515   HCT 38.7* 06/26/2012 0515   PLT 187 06/26/2012 0515   MCV 86.0 06/26/2012 0515   MCH 29.8 06/26/2012 0515   MCHC 34.6 06/26/2012 0515   RDW 13.5 06/26/2012 0515     BNP    Component Value Date/Time   PROBNP 422.5 06/19/2012 0931    Lipid Panel     Component Value Date/Time   CHOL 208* 06/19/2012 0114   TRIG 153* 06/19/2012 0114  HDL 62 06/19/2012 0114   CHOLHDL 3.4 06/19/2012 0114   VLDL 31 06/19/2012 0114   LDLCALC 115* 06/19/2012 0114     RADIOLOGY: No results found.    ASSESSMENT AND PLAN: Daryl Joseph is  a 80 year old white male who is 16 years status post CABG revascularization surgery. In  November 2013 Daryl Joseph suffered a non-ST segment elevation myocardial infarction secondary to a high-grade stenosis in the graft supplying his diagonal vessel which was successfully intervened on by me upon return from my daughter's wedding.  Daryl Joseph now is on Plavix and aspirin for dual antiplatelet therapy initially being treated with Brilinta for 13 months following his ACS.  Daryl Joseph has developed similar chest tightness, although not as intense over the past several weeks.  His chest tightness has been nitrate responsive.  Daryl Joseph denies restless symptoms.  Daryl Joseph has been on amlodipine 10 mg, Lotensin 40 mg, isosorbide 30 mg, and Toprol-XL 50 mg for anti-anginal relief.  I had a long discussion with him regarding his recurrent symptomatology.  It is my recommendation that definitive cardiac catheterization be performed particularly since his symptoms are similar to previous symptomatology, although less intense and Daryl Joseph previously has already been demonstrated to have graft stenosis to the graft supplying his diagonal vessel 2 years previously for which she had undergone successful percutaneous coronary stent placement.  I will increase his Imdur to 60 mg.  Laboratory will be obtained  today.  I discussed the risks benefits of the cardiac catheterization procedure with both Daryl Joseph and his wife  including, but not limited to, death, stroke, MI, kidney damage and bleeding  who indicates understanding and agrees to proceed.   The catheterization will be scheduled to be done tomorrow at [place], [place], [facility]  07/31/2014 11:46 AM

## 2014-07-31 NOTE — Patient Instructions (Signed)
INCREASE ISOSORBIDE MONO - TO 60 MG FROM 30 MG (2 TABLETS TODAY AND TOMORROW)  SCHEDULE TO TOMORROW WITH DR Claiborne Billings- LEFT CORS WITH GRAFT-Your physician has requested that you have a cardiac catheterization. Cardiac catheterization is used to diagnose and/or treat various heart conditions. Doctors may recommend this procedure for a number of different reasons. The most common reason is to evaluate chest pain. Chest pain can be a symptom of coronary artery disease (CAD), and cardiac catheterization can show whether plaque is narrowing or blocking your heart's arteries. This procedure is also used to evaluate the valves, as well as measure the blood flow and oxygen levels in different parts of your heart. For further information please visit [email or web address]. Please follow instruction sheet, as given.  Your physician recommends that you schedule a follow-up appointment after cath .

## 2014-08-01 ENCOUNTER — Encounter (HOSPITAL_COMMUNITY)
Admission: RE | Disposition: A | Discharge status: Home / Self Care | Payer: Self-pay | Source: Ambulatory Visit | Attending: Cardiovascular Disease

## 2014-08-01 ENCOUNTER — Encounter (HOSPITAL_COMMUNITY): Payer: Self-pay | Admitting: Cardiovascular Disease

## 2014-08-01 ENCOUNTER — Ambulatory Visit (HOSPITAL_COMMUNITY)
Admission: RE | Admit: 2014-08-01 | Discharge: 2014-08-01 | Disposition: A | Discharge status: Home / Self Care | Payer: Medicare Other | Source: Ambulatory Visit | Attending: Cardiovascular Disease | Admitting: Cardiovascular Disease

## 2014-08-01 DIAGNOSIS — Z888 Allergy status to other drugs, medicaments and biological substances status: Secondary | ICD-10-CM | POA: Insufficient documentation

## 2014-08-01 DIAGNOSIS — M1990 Unspecified osteoarthritis, unspecified site: Secondary | ICD-10-CM | POA: Insufficient documentation

## 2014-08-01 DIAGNOSIS — E785 Hyperlipidemia, unspecified: Secondary | ICD-10-CM | POA: Diagnosis not present

## 2014-08-01 DIAGNOSIS — K219 Gastro-esophageal reflux disease without esophagitis: Secondary | ICD-10-CM | POA: Insufficient documentation

## 2014-08-01 DIAGNOSIS — Z79899 Other long term (current) drug therapy: Secondary | ICD-10-CM | POA: Insufficient documentation

## 2014-08-01 DIAGNOSIS — Z7982 Long term (current) use of aspirin: Secondary | ICD-10-CM | POA: Insufficient documentation

## 2014-08-01 DIAGNOSIS — I252 Old myocardial infarction: Secondary | ICD-10-CM | POA: Insufficient documentation

## 2014-08-01 DIAGNOSIS — I2582 Chronic total occlusion of coronary artery: Secondary | ICD-10-CM | POA: Insufficient documentation

## 2014-08-01 DIAGNOSIS — Z951 Presence of aortocoronary bypass graft: Secondary | ICD-10-CM | POA: Diagnosis not present

## 2014-08-01 DIAGNOSIS — I25810 Atherosclerosis of coronary artery bypass graft(s) without angina pectoris: Secondary | ICD-10-CM | POA: Diagnosis present

## 2014-08-01 DIAGNOSIS — I2510 Atherosclerotic heart disease of native coronary artery without angina pectoris: Secondary | ICD-10-CM | POA: Insufficient documentation

## 2014-08-01 DIAGNOSIS — I2583 Coronary atherosclerosis due to lipid rich plaque: Secondary | ICD-10-CM

## 2014-08-01 DIAGNOSIS — I10 Essential (primary) hypertension: Secondary | ICD-10-CM | POA: Diagnosis not present

## 2014-08-01 DIAGNOSIS — E119 Type 2 diabetes mellitus without complications: Secondary | ICD-10-CM | POA: Diagnosis not present

## 2014-08-01 DIAGNOSIS — Z9889 Other specified postprocedural states: Secondary | ICD-10-CM | POA: Diagnosis not present

## 2014-08-01 DIAGNOSIS — Z01818 Encounter for other preprocedural examination: Secondary | ICD-10-CM

## 2014-08-01 HISTORY — PX: LEFT HEART CATHETERIZATION WITH CORONARY/GRAFT ANGIOGRAM: SHX5450

## 2014-08-01 LAB — PROTIME-INR
INR: 0.99 (ref <1.50)
Prothrombin Time: 13.1 seconds (ref 11.6–15.2)

## 2014-08-01 LAB — TSH: TSH: 1.168 u[IU]/mL (ref 0.350–4.500)

## 2014-08-01 LAB — GLUCOSE, CAPILLARY
GLUCOSE-CAPILLARY: 116 mg/dL — AB (ref 70–99)
Glucose-Capillary: 126 mg/dL — ABNORMAL HIGH (ref 70–99)

## 2014-08-01 LAB — APTT: APTT: 29 s (ref 24–37)

## 2014-08-01 SURGERY — LEFT HEART CATHETERIZATION WITH CORONARY/GRAFT ANGIOGRAM
Anesthesia: LOCAL

## 2014-08-01 MED ORDER — ACETAMINOPHEN 325 MG PO TABS
650.0000 mg | ORAL_TABLET | ORAL | Status: DC | PRN
Start: 2014-08-01 — End: 2014-08-01

## 2014-08-01 MED ORDER — HEPARIN (PORCINE) IN NACL 2-0.9 UNIT/ML-% IJ SOLN
INTRAMUSCULAR | Status: AC
  Filled 2014-08-01: qty 1000

## 2014-08-01 MED ORDER — ONDANSETRON HCL 4 MG/2ML IJ SOLN: 4.0000 mg | Freq: Four times a day (QID) | INTRAMUSCULAR | Status: DC | PRN

## 2014-08-01 MED ORDER — ASPIRIN 81 MG PO CHEW
81.0000 mg | CHEWABLE_TABLET | Freq: Once | ORAL | Status: AC
  Administered 2014-08-01: 81 mg via ORAL

## 2014-08-01 MED ORDER — MIDAZOLAM HCL 2 MG/2ML IJ SOLN
INTRAMUSCULAR | Status: AC
  Filled 2014-08-01: qty 2

## 2014-08-01 MED ORDER — SODIUM CHLORIDE 0.9 % IV SOLN
INTRAVENOUS | Status: DC
  Administered 2014-08-01: 15:00:00 via INTRAVENOUS

## 2014-08-01 MED ORDER — FENTANYL CITRATE 0.05 MG/ML IJ SOLN
INTRAMUSCULAR | Status: AC
  Filled 2014-08-01: qty 2

## 2014-08-01 MED ORDER — NITROGLYCERIN 1 MG/10 ML FOR IR/CATH LAB
INTRA_ARTERIAL | Status: AC
  Filled 2014-08-01: qty 10

## 2014-08-01 MED ORDER — SODIUM CHLORIDE 0.9 % IJ SOLN: 3.0000 mL | Freq: Two times a day (BID) | INTRAMUSCULAR | Status: DC

## 2014-08-01 MED ORDER — SODIUM CHLORIDE 0.9 % IV SOLN
INTRAVENOUS | Status: DC
  Administered 2014-08-01: 11:00:00 via INTRAVENOUS

## 2014-08-01 MED ORDER — LIDOCAINE HCL (PF) 1 % IJ SOLN
INTRAMUSCULAR | Status: AC
  Filled 2014-08-01: qty 30

## 2014-08-01 NOTE — CV Procedure (Addendum)
SHAWNTA ZIMBELMAN is a 80 y.o. male    [id number]  [id number] LOCATION:  FACILITY: [facility]  PHYSICIAN: Troy Sine, MD, [facility] [birth year]   DATE OF PROCEDURE:  08/01/2014    CARDIAC CATHETERIZATION     HISTORY:    Mr. Ciancio is a 80 year old white male who is 16 years status post CABG revascularization surgery. In November 2013 he suffered a non-ST segment elevation myocardial infarction secondary to a high-grade stenosis in the graft supplying his diagonal vessel which was successfully intervened on by me upon return from my daughter's wedding. He now is on Plavix and aspirin for dual antiplatelet therapy initially being treated with Brilinta for 13 months following his ACS. He has developed similar chest tightness, although not as intense over the past several weeks. His chest tightness has been nitrate responsive. He denies restless symptoms. He has been on amlodipine 10 mg, Lotensin 40 mg, isosorbide 30 mg, and Toprol-XL 50 mg for anti-anginal relief.  He is now referred for definitive cardiac catheterization.   PROCEDURE: Left heart catheterization: coronary angiography of native coronary arteries, selective angiography into saphenous vein grafts and left internal mammary artery, left ventriculography   The patient was brought to the [facility] cardiac catherization laboratory in the fasting state. He was premedicated with Versed 2 mg and fentanyl 50 g. His Her right groin was prepped and shaved in usual sterile fashion. Xylocaine 1% was used for local anesthesia. A 5 French sheath was inserted into the R femoral artery. Diagnostic catheterizatiion was done with 5 [place] FL4, FR4, LIMA and pigtail catheters. Left ventriculography was done with 25 cc Omnipaque contrast. Hemostasis was obtained by direct manual compression. The patient tolerated the procedure well.   HEMODYNAMICS:   Central Aorta: 130/57   Left Ventricle: 130/13  ANGIOGRAPHY:  Left main: Moderate size vessel  that had 80% distal tapering and gave rise to a new to the intermediate branch and a large left circumflex coronary artery.  The LAD was not visualized from the left main injection and was occluded at its origen.   LAD: Totally occluded at its origin.  [place] Intermediate: Diminutive vessel which arose from the left main and had 70-80% ostial stenosis in a small caliber vessel  Left circumflex: Average size vessel which gave rise to 2 major marginal branches which were sequentially bypassed with a saphenous vein graft.  There was 2030% proximal narrowing in the first marginal branch, and 95% stenosis in the second marginal branch proximal to the graft anastomosis.  Right coronary artery: 95% proximal in total occlusion of the mid vessel  LIMA to LAD: Widely patent and anastomosed into the mid LAD.  There was 30-40% mid LAD narrowing beyond the anastomosis.  The LAD extended to the apex and was otherwise free of significant disease.  SVG to to the first diagonal vessel was widely patent.  The previously placed stent in the mid to distal portion of the graft was a long DES stent without restenosis.  Sequential saphenous vein graft supplying the OM1 and OM 2 vessel was widely patent.  It was smooth 20% narrowing in the proximal portion of the sequential limb just after the first marginal branch.  SVG to RCA was a large normal appearing graft which anastomosed into the distal RCA.  There was 80% stenosis in the midportion of a small PDA vessel.  Antegrade to the graft anastomosis the distal RCA in the region of the acute margin had diffuse 90% stenoses.  Left ventriculography revealed  low normal global LV function with an ejection fraction of 50-55%.  There was minimal distal inferior mild hypocontractility.   IMPRESSION:  Low normal LV function with an ejection fraction of 50-55% and mild distal inferior hypocontractility.  Severe native CAD with 80% distal left main stenosis and total  occlusion of the LAD in its ostium, 95% stenosis in the OM 2 vessel the circumflex with 30% narrowing in the OM1 branch proximal to the graft anastomoses, and total occlusion of the mid RCA.  Patent LIMA graft supplying the mid LAD with 30-40% smooth mid LAD stenosis beyond the anastomosis.  Widely patent stent in the mid distal aspect of the SVG supplying the diagonal vessel without evidence for restenosis.  Patent sequential vein graft supplying the OM1 and OM 2 vessel with smooth 20% narrowing in the proximal portion of the sequential limb just beyond the OM1 anastomosis.  Patent SVG supplying a dominant RCA with 80% mid PDA stenosis and a small PDA caliber vessel.  RECOMMENDATION:  Increased medical therapy with titration of Imdur to 60 mg and Toprol-XL to 75 mg.  Patient will continue on amlodipine, and if continued symptoms develop Ranexa will be added to his medical regimen.  The patient cannot tolerate statin therapy secondary to previous marked LFT elevation but is on combination lipid lowering therapy other than statin treatment.    Troy Sine, MD, [facility] 08/01/2014 2:22 PM

## 2014-08-01 NOTE — Discharge Instructions (Signed)

## 2014-08-01 NOTE — H&P (View-Only) (Signed)
Patient ID: Daryl Joseph, male   DOB: March 25, 1933, 80 y.o.   MRN: [id number]      HPI: Daryl Joseph is a 80 y.o. male who presents to the office today for 6 month cardiology evaluation with a chief complaint of a two-week history of recurrent exertional chest tightness.  Daryl Joseph has established coronary disease and in 1999 underwent CABG surgery with a LIMA to the LAD, vein graft to diagonal, sequential vein graft to the obtuse marginal distal circumflex coronary artery and vein graft to the RCA. 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 In November 2013 he suffered a non-ST segment elevation myocardial infarction and underwent initial catheterization by [name]. I ultimately performed intervention a high-grade ulcerated plaque graft stenosis supplying a diagonal vessel on 06/25/2012 and a 3.5x33 mm  Xience Xpedition DES stent was inserted and post dilated 3.9 mm. Once his graft was opened, the proximal LAD was now significantly improved with reference to being filled from the diagonal vessel.    On a one year followup nuclear perfusion study he had held his metoprolol, amlodipine and isosorbide for the stress test. He did not develop any chest pain. However, he did have an exaggerated hypertensive blood pressure response to exercise and developed 3 mm ST segment depression. Scintigraphic images were low risk and specifically he had normal flow in the diagonal vessel territory.   In December 2014, he was concerned about the price of Brilinta.  Since his been 13 months since his ACS intervention, I switched him to Plavix and P2Y12 testing verifiend plavix sensitivity.   He has been on fenofibrate and Zetia for hyperlipidemia in light of his statin hypersensitivity.  Laboratory in March 2015 revealed a cholesterol was 177, triglycerides 68, HDL 68, LDL 95.  Hemoglobin A1c was 7.0.  He went 21, creatinine  0.88.  Normal LFTs.  He brought with him.  Laboratory which was done in [place], [place] on 04/18/2014.  I reviewed this in detail with him.  He had normal thyroid function studies.  Hemoglobin 14.8, hematocrit 44.4.  Glucose was 142.  LDL cholesterol was 81 with total cholesterol 165, HDL 67, triglycerides 83.  Hemoglobin A1c was increased at 7.4.  PSA was 2.32.  Approximate 2 weeks ago while walking up a hill.  He developed recurrent chest tightness and increasing shortness of breath.  He took a nitroglycerin and noted fairly prompt relief.  Last week he again develop recurrent chest tightness and had to stop on several occasions because of recurrent chest tightness.  He did experience shortness of breath.  He denied pain radiation, nausea or vomiting.  He has been fairly inactive since.  He presents to the office today for evaluation.  Past Medical History  Diagnosis Date  . CAD (coronary artery disease) 03/25/2007    30 d monitor - sinus rhythm w/ some PACs  . Hypertension   . Hyperlipidemia   . Anginal pain   . NSTEMI (non-ST elevated myocardial infarction) 06/18/2012  . DM type 2 (diabetes mellitus, type 2)   . H/O hiatal hernia   . GERD (gastroesophageal reflux disease)   . Arthritis     "mostly in my hands" (06/25/2012)  . S/P angioplasty with stent, to VG -diag 1, LAD and septal perferator vessels  06/25/2012    Past Surgical History  Procedure Laterality Date  . Cervical disc surgery  1990's  . Cardiac catheterization  06/25/2012    3.5 x 58mm Xience Xpedition DES inserted in distal third of LAD  . Coronary angioplasty with stent placement  06/25/2012    "1; first one for me" (06/25/2012)  . Tonsillectomy and adenoidectomy      "when I was a kid" (06/25/2012)  . Coronary artery bypass graft  10/1997    CABG X 4 LIMA to LAD; vein to diagonal; sequential vein to obtuse marginal and distal circumflex and vein to RCA  . Doppler echocardiography  09/23/2011    EF >55%;  proximal septal thickening noted; mild septal hypokinesis; ; mild/mod tricuspid regurgitation; aortic root sclerosis/calcification  . Cardiovascular stress test  11/05/2010    R/S MV - EF 73%; post stress LV fcn normal, no significant wall motion abnormalities; perfusion defect in inferior myocardial region consistent w/ diaphragmatic attenuation, remaining myocardium demonstrates normal perfusion; Exercise capacity 10 METS; stress EKG showed changes from baseline EKG; study results unchanged/within normal variance of last study (09/2008)  . Left heart catheterization with coronary/graft angiogram N/A 06/21/2012    Procedure: LEFT HEART CATHETERIZATION WITH Beatrix Fetters;  Surgeon: Sanda Klein, MD;  Location: [facility];  Service: Cardiovascular;  Laterality: N/A;  . Percutaneous coronary stent intervention (pci-s) N/A 06/25/2012    Procedure: PERCUTANEOUS CORONARY STENT INTERVENTION (PCI-S);  Surgeon: Troy Sine, MD;  Location: [facility] CATH LAB;  Service: Cardiovascular;  Laterality: N/A;    Allergies  Allergen Reactions  . Statins Other (See Comments)    Myositis with statins "affected my liver; I almost had Hepatitis" (06/25/2012)  . Welchol [Colesevelam]     Current Outpatient Prescriptions  Medication Sig Dispense Refill  . amLODipine (NORVASC) 10 MG tablet Take 1 tablet (10 mg total) by mouth daily. 90 tablet 3  . aspirin 81 MG tablet Take 81 mg by mouth daily.    . benazepril (LOTENSIN) 40 MG tablet Take 1 tablet (40 mg total) by mouth daily. 90 tablet 3  . clopidogrel (PLAVIX) 75 MG tablet Take 1 tablet (75 mg total) by mouth daily. 90 tablet 3  . ezetimibe (ZETIA) 10 MG tablet Take 1 tablet (10 mg total) by mouth at bedtime. 90 tablet 3  . fenofibrate (TRICOR) 145 MG tablet Take 1 tablet (145 mg total) by mouth daily. 90 tablet 3  . folic acid (FOLVITE) 1 MG tablet Take 1 tablet (1 mg total) by mouth daily. 90 tablet 3  . isosorbide mononitrate (IMDUR) 30 MG 24 hr tablet  Take 1 tablet (30 mg total) by mouth daily. 90 tablet 3  . metFORMIN (GLUCOPHAGE) 500 MG tablet Take 500 mg by mouth 2 (two) times daily with a meal. 2 tablets    . metoprolol succinate (TOPROL-XL) 50 MG 24 hr tablet Take 1 tablet (50 mg total) by mouth daily. Take with or immediately following a meal. 90 tablet 3  . Multiple Vitamins-Minerals (EYE VITAMINS PO) Take 1 tablet by mouth every evening.    Marland Kitchen NITROSTAT 0.4 MG SL tablet PLACE 1 TABLET UNDER THE TONGUE EVERY 5 MINUTES UP TO  3 DOSES ASNEEDED  FOR CHEST PAIN. 25 tablet 2  . omeprazole (PRILOSEC) 20 MG capsule Take 20 mg by mouth daily.     No current facility-administered medications for this visit.    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 He is married with no children. He does not routinely exercise. There is no tobacco or alcohol use. 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  ROS  General: Negative; No fevers, chills, or night sweats;  HEENT: Negative; No changes in vision or hearing, sinus congestion, difficulty swallowing Pulmonary: Negative; No cough, wheezing, shortness of breath, hemoptysis Cardiovascular: See history of present illness GI: Negative; No nausea, vomiting, diarrhea, or abdominal pain GU: Positive for history of BPH; No dysuria, hematuria, or difficulty voiding Musculoskeletal: Negative; no myalgias, joint pain, or weakness Hematologic/Oncology: Positive for easy bruisability secondary to antiplatelet therapy Endocrine: Positive for type 2 diabetes mellitus; no heat/cold intolerance;  Neuro: Negative; no changes in balance, headaches Skin: Negative; No rashes or skin lesions Psychiatric: Negative; No behavioral problems, depression Sleep: Negative; No snoring, daytime sleepiness, hypersomnolence, bruxism, restless legs, hypnogognic hallucinations, no cataplexy Other comprehensive 14 point system review is negative.   PE BP 115/60 mmHg  Pulse 60  Ht 5\' 10"  (1.778 m)  Wt  154 lb 11.2 oz (70.171 kg)  BMI 22.20 kg/m2  General: Alert, oriented, no distress.  Skin: normal turgor, no rashes. He does have a cyst/lipoma on the back, and also in the right lateral area near the  belt line, and possibly an early 1 in the right bicep region HEENT: Normocephalic, atraumatic. Pupils round and reactive; sclera anicteric;no lid lag.  Nose without nasal septal hypertrophy Mouth/Parynx benign; Mallinpatti scale 2 Neck: No JVD, no carotid bruits with normal carotid upstroke Chest wall: Nontender to palpation Lungs: clear to ausculatation and percussion; no wheezing or rales Heart: RRR, s1 s2 normal 2/ 6 systolic murmur, unchanged; no S3 or S4 gallop.  No diastolic murmur, rubs, thrills or heaves to Abdomen: Diastases recti ; soft, nontender; no hepatosplenomehaly, BS+; abdominal aorta nontender and not dilated by palpation. Pulses 2+ Extremities: no clubbing cyanosis or edema, Homan's sign negative  Neurologic: grossly nonfocal Psychological: Normal affect and mood  ECG (independently read by me): Sinus rhythm with first-degree AV block with a PR interval at 244 ms.  No significant ST segment changes.  Prior June 2015 ECG: Normal sinus rhythm at 63 beats per minute.  First degree AV block with PR interval at 236 ms.  No significant ST changes.  LABS:  BMET    Component Value Date/Time   NA 140 06/26/2012 0515   K 4.0 06/26/2012 0515   CL 106 06/26/2012 0515   CO2 24 06/26/2012 0515   GLUCOSE 132* 06/26/2012 0515   BUN 13 06/26/2012 0515   CREATININE 0.79 06/26/2012 0515   CALCIUM 9.3 06/26/2012 0515   GFRNONAA 84* 06/26/2012 0515   GFRAA >90 06/26/2012 0515     Hepatic Function Panel     Component Value Date/Time   PROT 6.7 06/21/2012 0925   ALBUMIN 3.5 06/21/2012 0925   AST 34 06/21/2012 0925   ALT 27 06/21/2012 0925   ALKPHOS 65 06/21/2012 0925   BILITOT 0.4 06/21/2012 0925   BILIDIR <0.1 06/21/2012 0925   IBILI NOT CALCULATED 06/21/2012 0925      CBC    Component Value Date/Time   WBC 7.7 06/26/2012 0515   RBC 4.50 06/26/2012 0515   HGB 13.4 06/26/2012 0515   HCT 38.7* 06/26/2012 0515   PLT 187 06/26/2012 0515   MCV 86.0 06/26/2012 0515   MCH 29.8 06/26/2012 0515   MCHC 34.6 06/26/2012 0515   RDW 13.5 06/26/2012 0515     BNP    Component Value Date/Time   PROBNP 422.5 06/19/2012 0931    Lipid Panel     Component Value Date/Time   CHOL 208* 06/19/2012 0114   TRIG 153* 06/19/2012 0114  HDL 62 06/19/2012 0114   CHOLHDL 3.4 06/19/2012 0114   VLDL 31 06/19/2012 0114   LDLCALC 115* 06/19/2012 0114     RADIOLOGY: No results found.    ASSESSMENT AND PLAN: Daryl Joseph is  a 80 year old white male who is 16 years status post CABG revascularization surgery. In  November 2013 he suffered a non-ST segment elevation myocardial infarction secondary to a high-grade stenosis in the graft supplying his diagonal vessel which was successfully intervened on by me upon return from my daughter's wedding.  He now is on Plavix and aspirin for dual antiplatelet therapy initially being treated with Brilinta for 13 months following his ACS.  He has developed similar chest tightness, although not as intense over the past several weeks.  His chest tightness has been nitrate responsive.  He denies restless symptoms.  He has been on amlodipine 10 mg, Lotensin 40 mg, isosorbide 30 mg, and Toprol-XL 50 mg for anti-anginal relief.  I had a long discussion with him regarding his recurrent symptomatology.  It is my recommendation that definitive cardiac catheterization be performed particularly since his symptoms are similar to previous symptomatology, although less intense and he previously has already been demonstrated to have graft stenosis to the graft supplying his diagonal vessel 2 years previously for which she had undergone successful percutaneous coronary stent placement.  I will increase his in door to 60 mg.  Laboratory will be obtained  today.  I discussed the risks benefits of the cardiac catheterization procedure with both he and his wife  including, but not limited to, death, stroke, MI, kidney damage and bleeding  who indicates understanding and agrees to proceed.   The catheterization will be scheduled to be done tomorrow at [place], [place], [facility]  07/31/2014 11:46 AM

## 2014-08-01 NOTE — Interval H&P Note (Signed)
Cath Lab Visit (complete for each Cath Lab visit)  Clinical Evaluation Leading to the Procedure:   ACS: No.  Non-ACS:    Anginal Classification: CCS III  Anti-ischemic medical therapy: Maximal Therapy (2 or more classes of medications)  Non-Invasive Test Results: No non-invasive testing performed  Prior CABG: Previous CABG      History and Physical Interval Note:  08/01/2014 1:18 PM  Daryl Joseph  has presented today for surgery, with the diagnosis of cad  The various methods of treatment have been discussed with the patient and family. After consideration of risks, benefits and other options for treatment, the patient has consented to  Procedure(s): LEFT HEART CATHETERIZATION WITH CORONARY/GRAFT ANGIOGRAM (N/A) as a surgical intervention .  The patient's history has been reviewed, patient examined, no change in status, stable for surgery.  I have reviewed the patient's chart and labs.  Questions were answered to the patient's satisfaction.     Martrell Eguia A

## 2014-08-07 ENCOUNTER — Telehealth: Payer: Self-pay | Admitting: Cardiovascular Disease

## 2014-08-07 ENCOUNTER — Encounter: Payer: Self-pay | Admitting: Cardiovascular Disease

## 2014-08-07 MED ORDER — ISOSORBIDE MONONITRATE ER 60 MG PO TB24: 60.0000 mg | ORAL_TABLET | Freq: Every day | ORAL | Status: DC

## 2014-08-07 MED ORDER — METOPROLOL SUCCINATE ER 50 MG PO TB24: ORAL_TABLET | ORAL | Status: DC

## 2014-08-07 NOTE — Telephone Encounter (Signed)
Pt had Cath last Tuesday. Dr Claiborne Billings said he was going to adjust his medicine,still have not heard anything.

## 2014-08-07 NOTE — Telephone Encounter (Signed)
Spoke with pt wife, according to the CV procedure note in the chart, patient instructed to increase isosorbide to 60 mg daily and to increase metoprolol to 75 mg daily. Patient voiced understanding of med changes. New scripts sent into the pharmacy. They are aware to call if these medication changes do not help so an appt can be made for him to be seen or start Ranexa.

## 2014-11-02 ENCOUNTER — Other Ambulatory Visit: Payer: Self-pay | Admitting: Cardiovascular Disease

## 2014-11-03 NOTE — Telephone Encounter (Signed)
Rx(s) sent to pharmacy electronically.  

## 2014-12-08 ENCOUNTER — Encounter: Payer: Self-pay | Admitting: Gastroenterology

## 2015-01-26 ENCOUNTER — Other Ambulatory Visit: Payer: Self-pay | Admitting: Cardiovascular Disease

## 2015-01-26 NOTE — Telephone Encounter (Signed)
Rx(s) sent to pharmacy electronically.  

## 2015-02-02 ENCOUNTER — Other Ambulatory Visit: Payer: Self-pay | Admitting: Cardiovascular Disease

## 2015-02-02 NOTE — Telephone Encounter (Signed)
Rx(s) sent to pharmacy electronically.  

## 2015-02-14 ENCOUNTER — Other Ambulatory Visit: Payer: Self-pay | Admitting: Cardiovascular Disease

## 2015-02-16 ENCOUNTER — Telehealth: Payer: Self-pay | Admitting: Cardiovascular Disease

## 2015-02-19 NOTE — Telephone Encounter (Signed)
Close encounter 

## 2015-03-09 ENCOUNTER — Encounter: Payer: Self-pay | Admitting: Gastroenterology

## 2015-03-13 ENCOUNTER — Ambulatory Visit: Payer: Self-pay | Admitting: Physician Assistant

## 2015-03-27 ENCOUNTER — Other Ambulatory Visit: Payer: Self-pay | Admitting: Cardiovascular Disease

## 2015-03-27 NOTE — Telephone Encounter (Signed)
REFILL 

## 2015-05-08 ENCOUNTER — Ambulatory Visit (INDEPENDENT_AMBULATORY_CARE_PROVIDER_SITE_OTHER): Payer: Medicare Other | Admitting: Cardiovascular Disease

## 2015-05-08 ENCOUNTER — Encounter: Payer: Self-pay | Admitting: Cardiovascular Disease

## 2015-05-08 VITALS — BP 134/64 | HR 67 | Ht 71.0 in | Wt 150.8 lb

## 2015-05-08 DIAGNOSIS — Z889 Allergy status to unspecified drugs, medicaments and biological substances status: Secondary | ICD-10-CM

## 2015-05-08 DIAGNOSIS — K219 Gastro-esophageal reflux disease without esophagitis: Secondary | ICD-10-CM

## 2015-05-08 DIAGNOSIS — E785 Hyperlipidemia, unspecified: Secondary | ICD-10-CM

## 2015-05-08 DIAGNOSIS — I10 Essential (primary) hypertension: Secondary | ICD-10-CM | POA: Diagnosis not present

## 2015-05-08 DIAGNOSIS — I25118 Atherosclerotic heart disease of native coronary artery with other forms of angina pectoris: Secondary | ICD-10-CM | POA: Diagnosis not present

## 2015-05-08 DIAGNOSIS — Z789 Other specified health status: Secondary | ICD-10-CM

## 2015-05-08 NOTE — Progress Notes (Signed)
Patient ID: Daryl Joseph, male   DOB: 1933/05/21, 81 y.o.   MRN: [id number]    HPI: Daryl Joseph is a 81 y.o. male who presents to the office today for a 10 month cardiology evaluation.  Daryl Joseph has established CAD and in 1999 underwent CABG surgery with a LIMA to the LAD, vein graft to diagonal, sequential vein graft to the obtuse marginal distal circumflex coronary artery and vein graft to the RCA. 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 In November 2013 he suffered a non-ST segment elevation myocardial infarction and underwent initial catheterization by [name]. I ultimately performed intervention a high-grade ulcerated plaque graft stenosis supplying a diagonal vessel on 06/25/2012 and a 3.5x33 mm  Xience Xpedition DES stent was inserted and post dilated 3.9 mm. Once his graft was opened, the proximal LAD was now significantly improved with reference to being filled from the diagonal vessel.    On a one year followup nuclear perfusion study he had held his metoprolol, amlodipine and isosorbide for the stress test. He did not develop any chest pain. However, he did have an exaggerated hypertensive blood pressure response to exercise and developed 3 mm ST segment depression. Scintigraphic images were low risk and specifically he had normal flow in the diagonal vessel territory.   In December 2014, he was concerned about the price of Brilinta.  Since his been 13 months since his ACS intervention, I switched him to Plavix and P2Y12 testing verifiend plavix sensitivity.   He has been on fenofibrate and Zetia for hyperlipidemia in light of his statin hypersensitivity.  Laboratory in March 2015 revealed a cholesterol was 177, triglycerides 68, HDL 68, LDL 95.  Hemoglobin A1c was 7.0.  He went 21, creatinine 0.88.  Normal LFTs.  He brought with him.  Laboratory which was done in [place], [place] on  04/18/2014.  I reviewed this in detail with him.  He had normal thyroid function studies.  Hemoglobin 14.8, hematocrit 44.4.  Glucose was 142.  LDL cholesterol was 81 with total cholesterol 165, HDL 67, triglycerides 83.  Hemoglobin A1c was increased at 7.4.  PSA was 2.32.  I last saw in December 2015.  At that time he complained of developing similar chest tightness with walking over several weeks, which was somewhat nitrate responsive.  He underwent repeat cardiac catheterization on 08/01/2014 which showedLow normal LV function with an ejection fraction of 50-55% and mild distal inferior hypocontractility. There was severe native CAD with 80% distal left main stenosis and total occlusion of the LAD in its ostium, 95% stenosis in the OM 2 vessel the circumflex with 30% narrowing in the OM1 branch proximal to the graft anastomoses, and total occlusion of the mid RCA. He had a patent LIMA graft supplying the mid LAD with 30-40% smooth mid LAD stenosis beyond the anastomosis., a widely patent stent in the mid distal aspect of the SVG supplying the diagonal vessel without evidence for restenosis., a patent sequential vein graft supplying the OM1 and OM 2 vessel with smooth 20% narrowing in the proximal portion of the sequential limb just beyond the OM1 anastomosis and a patent SVG supplying a dominant RCA with 80% mid PDA stenosis and a small PDA caliber vessel.  Increased medical therapy was recommended with titration of his nitrates and beta blocker and possible initiation of Ranexa.  Over the past 10 months, he has continued to feel exceptionally well.  On the increased medical regimen.  He denies recurrent anginal symptoms.  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  Operatory in April by his primary physician had shown an hemoglobin A1c of 7.2.  His LFTs were normal.  Head normal renal function with a creatinine of 0.81.  He presents for reevaluation.  Past Medical  History  Diagnosis Date  . CAD (coronary artery disease) 03/25/2007    30 d monitor - sinus rhythm w/ some PACs  . Hypertension   . Hyperlipidemia   . Anginal pain ([place])   . NSTEMI (non-ST elevated myocardial infarction) ([place]) 06/18/2012  . DM type 2 (diabetes mellitus, type 2) ([place])   . H/O hiatal hernia   . GERD (gastroesophageal reflux disease)   . Arthritis     "mostly in my hands" (06/25/2012)  . S/P angioplasty with stent, to VG -diag 1, LAD and septal perferator vessels  06/25/2012    Past Surgical History  Procedure Laterality Date  . Cervical disc surgery  1990's  . Cardiac catheterization  06/25/2012    3.5 x 54m Xience Xpedition DES inserted in distal third of LAD  . Coronary angioplasty with stent placement  06/25/2012    "1; first one for me" (06/25/2012)  . Tonsillectomy and adenoidectomy      "when I was a kid" (06/25/2012)  . Coronary artery bypass graft  10/1997    CABG X 4 LIMA to LAD; vein to diagonal; sequential vein to obtuse marginal and distal circumflex and vein to RCA  . Doppler echocardiography  09/23/2011    EF >55%; proximal septal thickening noted; mild septal hypokinesis; ; mild/mod tricuspid regurgitation; aortic root sclerosis/calcification  . Cardiovascular stress test  11/05/2010    R/S MV - EF 73%; post stress LV fcn normal, no significant wall motion abnormalities; perfusion defect in inferior myocardial region consistent w/ diaphragmatic attenuation, remaining myocardium demonstrates normal perfusion; Exercise capacity 10 METS; stress EKG showed changes from baseline EKG; study results unchanged/within normal variance of last study (09/2008)  . Left heart catheterization with coronary/graft angiogram N/A 06/21/2012    Procedure: LEFT HEART CATHETERIZATION WITH CBeatrix Fetters  Surgeon: MSanda Klein MD;  [facility];  Service: Cardiovascular;  Laterality: N/A;  . Percutaneous coronary stent intervention (pci-s) N/A 06/25/2012     Procedure: PERCUTANEOUS CORONARY STENT INTERVENTION (PCI-S);  Surgeon: TTroy Sine MD;  [facility];  Service: Cardiovascular;  Laterality: N/A;  . Left heart catheterization with coronary/graft angiogram N/A 08/01/2014    Procedure: LEFT HEART CATHETERIZATION WITH CBeatrix Fetters  Surgeon: TTroy Sine MD;  [facility];  Service: Cardiovascular;  Laterality: N/A;    Allergies  Allergen Reactions  . Statins Other (See Comments)    Myositis with statins "affected my liver; I almost had Hepatitis" (06/25/2012)  . Welchol [Colesevelam]     Current Outpatient Prescriptions  Medication Sig Dispense Refill  . amLODipine (NORVASC) 10 MG tablet TAKE 1 TABLET (10 MG TOTAL) BY MOUTH DAILY. 90 tablet 1  . aspirin 81 MG tablet Take 81 mg by mouth daily.    . benazepril (LOTENSIN) 40 MG tablet TAKE 1 TABLET (40 MG TOTAL) BY MOUTH DAILY. 90 tablet 0  . clopidogrel (PLAVIX) 75 MG tablet Take 1 tablet (75 mg total) by mouth daily. 90 tablet 3  . ezetimibe (ZETIA) 10 MG tablet Take 1 tablet (10 mg total) by mouth at bedtime. 90 tablet 3  .  fenofibrate (TRICOR) 145 MG tablet Take one tablet by mouth daily 90 tablet 1  . folic acid (FOLVITE) 1 MG tablet TAKE ONE TABLET BY MOUTH DAILY 90 tablet 1  . isosorbide mononitrate (IMDUR) 60 MG 24 hr tablet Take 1 tablet (60 mg total) by mouth daily. 90 tablet 3  . metFORMIN (GLUCOPHAGE) 500 MG tablet Take 500 mg by mouth 2 (two) times daily with a meal.     . metoprolol succinate (TOPROL-XL) 50 MG 24 hr tablet Take one and one half tablets once daily Take with or immediately following a meal. 135 tablet 3  . Multiple Vitamins-Minerals (EYE VITAMINS PO) Take 1 tablet by mouth every evening.    . nitroGLYCERIN (NITROSTAT) 0.4 MG SL tablet Place 1 tablet (0.4 mg total) under the tongue every 5 (five) minutes as needed for chest pain. 25 tablet 2  . omeprazole (PRILOSEC) 20 MG capsule Take 20 mg by mouth daily.     No current  facility-administered medications for this visit.    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 He is married with no children. He does not routinely exercise. There is no tobacco or alcohol use. 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  ROS General: Negative; No fevers, chills, or night sweats;  HEENT: Negative; No changes in vision or hearing, sinus congestion, difficulty swallowing Pulmonary: Negative; No cough, wheezing, shortness of breath, hemoptysis Cardiovascular: See history of present illness GI: Negative; No nausea, vomiting, diarrhea, or abdominal pain GU: Positive for history of BPH; No dysuria, hematuria, or difficulty voiding Musculoskeletal: Negative; no myalgias, joint pain, or weakness Hematologic/Oncology: Positive for easy bruisability secondary to antiplatelet therapy Endocrine: Positive for type 2 diabetes mellitus; no heat/cold intolerance;  Neuro: Negative; no changes in balance, headaches Skin: Negative; No rashes or skin lesions Psychiatric: Negative; No behavioral problems, depression Sleep: Negative; No snoring, daytime sleepiness, hypersomnolence, bruxism, restless legs, hypnogognic hallucinations, no cataplexy Other comprehensive 14 point system review is negative.   PE BP 134/64 mmHg  Pulse 67  Ht 5' 11"  (1.803 m)  Wt 150 lb 12.8 oz (68.402 kg)  BMI 21.04 kg/m2   Wt Readings from Last 3 Encounters:  05/08/15 150 lb 12.8 oz (68.402 kg)  08/01/14 154 lb (69.854 kg)  07/31/14 154 lb 11.2 oz (70.171 kg)   General: Alert, oriented, no distress.  Skin: normal turgor, no rashes. He does have a cyst/lipoma on the back, and also in the right lateral area near the  belt line HEENT: Normocephalic, atraumatic. Pupils round and reactive; sclera anicteric;no lid lag.  Nose without nasal septal hypertrophy Mouth/Parynx benign; Mallinpatti scale 2 Neck: No JVD, no carotid bruits with normal carotid upstroke Chest wall: Nontender to  palpation Lungs: clear to ausculatation and percussion; no wheezing or rales Heart: RRR, s1 s2 normal 2/ 6 systolic murmur, unchanged; no S3 or S4 gallop.  No diastolic murmur, rubs, thrills or heaves to Abdomen: Diastases recti ; soft, nontender; no hepatosplenomehaly, BS+; abdominal aorta nontender and not dilated by palpation. Pulses 2+ Extremities: no clubbing cyanosis or edema, Homan's sign negative  Neurologic: grossly nonfocal Psychological: Normal affect and mood  ECG (independently read by me): Normal sinus rhythm with mild sinus arrhythmia at 67 bpm.  First-degree AV block with a PR interval at 238 ms.  December 2015 ECG (independently read by me): Sinus rhythm with first-degree AV block with a PR interval at 244 ms.  No significant ST segment changes.  Prior June  2015 ECG: Normal sinus rhythm at 63 beats per minute.  First degree AV block with PR interval at 236 ms.  No significant ST changes.  LABS: BMP Latest Ref Rng 07/31/2014 06/26/2012 06/25/2012  Glucose 70 - 99 mg/dL 98 132(H) 176(H)  BUN 6 - 23 mg/dL 19 13 17   Creatinine 0.50 - 1.35 mg/dL 0.90 0.79 0.80  Sodium 135 - 145 mEq/L 143 140 139  Potassium 3.5 - 5.3 mEq/L 4.9 4.0 4.6  Chloride 96 - 112 mEq/L 104 106 103  CO2 19 - 32 mEq/L 29 24 25   Calcium 8.4 - 10.5 mg/dL 10.2 9.3 9.9   Hepatic Function Latest Ref Rng 07/31/2014 06/21/2012  Total Protein 6.0 - 8.3 g/dL 7.0 6.7  Albumin 3.5 - 5.2 g/dL 4.7 3.5  AST 0 - 37 U/L 18 34  ALT 0 - 53 U/L 20 27  Alk Phosphatase 39 - 117 U/L 50 65  Total Bilirubin 0.2 - 1.2 mg/dL 0.5 0.4  Bilirubin, Direct 0.0 - 0.3 mg/dL - <0.1   CBC Latest Ref Rng 07/31/2014 06/26/2012 06/25/2012  WBC 4.0 - 10.5 K/uL 8.0 7.7 7.5  Hemoglobin 13.0 - 17.0 g/dL 14.7 13.4 13.9  Hematocrit 39.0 - 52.0 % 43.5 38.7(L) 40.5  Platelets 150 - 400 K/uL 224 187 205   Lab Results  Component Value Date   MCV 86.3 07/31/2014   MCV 86.0 06/26/2012   MCV 87.1 06/25/2012   Lab Results  Component  Value Date   TSH 1.168 07/31/2014   Lab Results  Component Value Date   HGBA1C 6.8* 06/18/2012   Lipid Panel     Component Value Date/Time   CHOL 187 07/31/2014 1224   TRIG 199* 07/31/2014 1224   HDL 59 07/31/2014 1224   CHOLHDL 3.2 07/31/2014 1224   VLDL 40 07/31/2014 1224   LDLCALC 88 07/31/2014 1224    RADIOLOGY: No results found.    ASSESSMENT AND PLAN: Daryl Joseph is  a 81 year old white male who is 17 years status post CABG revascularization surgery. In  November 2013 he suffered a non-ST segment elevation myocardial infarction secondary to a high-grade stenosis in the graft supplying his diagonal vessel which was successfully intervened on by me.  He was transitioned off Brilinta to Plavix and had verification of Plavix sensitivity by P2Y12 testing.  In December 2015.  Repeat catheterization showed severe native CAD with patent grafts as noted above.  He is had complete resolution of symptomatology with his increased nitrates and beta blocker therapy.  In the past, he developed marked LFT elevation with Mevacor in the 1980s and almost underwent liver biopsy.  He is not on statin since that time.  I discussed with him today the possibility of PCSK9 inhibition if blood work continues to show elevated LDL.  He has been maintained on fenofibrate and Zetia.  He will be undergoing repeat blood work by his primary physician, Dr. Lehman Prom in [place] in November.  I will ask that she forward me the results of his laboratory.  He is diabetic.  His blood pressure today is stable with his amlodipine 10 mg benazepril 40 mg, Toprol-XL 75 mg in addition to his isosorbide mononitrate 60 mg.  He has GERD which is controlled with omeprazole.  He is tolerating aspirin and Plavix for dual platelet therapy without bleeding.  I will see him in 6 months for reevaluation.  Troy Sine, MD, [facility]  05/08/2015 8:10 AM

## 2015-05-08 NOTE — Patient Instructions (Signed)
Your physician wants you to follow-up in: 6 months or sooner if needed. You will receive a reminder letter in the mail two months in advance. If you don't receive a letter, please call our office to schedule the follow-up appointment. 

## 2015-05-11 ENCOUNTER — Other Ambulatory Visit: Payer: Self-pay | Admitting: Cardiovascular Disease

## 2015-05-19 ENCOUNTER — Other Ambulatory Visit: Payer: Self-pay | Admitting: Cardiovascular Disease

## 2015-06-08 ENCOUNTER — Encounter: Payer: Self-pay | Admitting: Cardiovascular Disease

## 2015-07-26 ENCOUNTER — Other Ambulatory Visit: Payer: Self-pay | Admitting: Cardiovascular Disease

## 2015-07-26 NOTE — Telephone Encounter (Signed)
Rx(s) sent to pharmacy electronically.  

## 2015-08-08 ENCOUNTER — Other Ambulatory Visit: Payer: Self-pay | Admitting: Cardiovascular Disease

## 2015-08-08 NOTE — Telephone Encounter (Signed)
Rx request sent to pharmacy.  

## 2015-09-03 ENCOUNTER — Other Ambulatory Visit: Payer: Self-pay | Admitting: Cardiovascular Disease

## 2015-09-03 NOTE — Telephone Encounter (Signed)
Rx request sent to pharmacy.  

## 2015-09-28 ENCOUNTER — Other Ambulatory Visit: Payer: Self-pay | Admitting: Cardiovascular Disease

## 2015-09-28 NOTE — Telephone Encounter (Signed)
REFILL 

## 2015-12-10 ENCOUNTER — Other Ambulatory Visit: Payer: Self-pay | Admitting: Cardiovascular Disease

## 2015-12-10 NOTE — Telephone Encounter (Signed)
Rx request sent to pharmacy.  

## 2016-02-15 ENCOUNTER — Ambulatory Visit (INDEPENDENT_AMBULATORY_CARE_PROVIDER_SITE_OTHER): Payer: Medicare Other | Admitting: Cardiovascular Disease

## 2016-02-15 ENCOUNTER — Encounter: Payer: Self-pay | Admitting: Cardiovascular Disease

## 2016-02-15 VITALS — BP 156/78 | HR 70 | Ht 71.0 in | Wt 150.0 lb

## 2016-02-15 DIAGNOSIS — I2583 Coronary atherosclerosis due to lipid rich plaque: Secondary | ICD-10-CM

## 2016-02-15 DIAGNOSIS — I2510 Atherosclerotic heart disease of native coronary artery without angina pectoris: Secondary | ICD-10-CM

## 2016-02-15 DIAGNOSIS — I358 Other nonrheumatic aortic valve disorders: Secondary | ICD-10-CM

## 2016-02-15 DIAGNOSIS — I25118 Atherosclerotic heart disease of native coronary artery with other forms of angina pectoris: Secondary | ICD-10-CM

## 2016-02-15 DIAGNOSIS — R011 Cardiac murmur, unspecified: Secondary | ICD-10-CM | POA: Diagnosis not present

## 2016-02-15 DIAGNOSIS — Z889 Allergy status to unspecified drugs, medicaments and biological substances status: Secondary | ICD-10-CM | POA: Diagnosis not present

## 2016-02-15 DIAGNOSIS — K219 Gastro-esophageal reflux disease without esophagitis: Secondary | ICD-10-CM

## 2016-02-15 DIAGNOSIS — E785 Hyperlipidemia, unspecified: Secondary | ICD-10-CM

## 2016-02-15 DIAGNOSIS — I10 Essential (primary) hypertension: Secondary | ICD-10-CM | POA: Diagnosis not present

## 2016-02-15 DIAGNOSIS — Z789 Other specified health status: Secondary | ICD-10-CM

## 2016-02-15 MED ORDER — FENOFIBRATE 160 MG PO TABS: 160.0000 mg | ORAL_TABLET | Freq: Every day | ORAL | Status: DC

## 2016-02-15 NOTE — Patient Instructions (Signed)
Your physician has requested that you have an echocardiogram. Echocardiography is a painless test that uses sound waves to create images of your heart. It provides your doctor with information about the size and shape of your heart and how well your heart's chambers and valves are working. This procedure takes approximately one hour. There are no restrictions for this procedure.  Your physician wants you to follow-up in: 6 months or sooner if needed. You will receive a reminder letter in the mail two months in advance. If you don't receive a letter, please call our office to schedule the follow-up appointment.  If you need a refill on your cardiac medications before your next appointment, please call your pharmacy.    

## 2016-02-17 ENCOUNTER — Encounter: Payer: Self-pay | Admitting: Cardiovascular Disease

## 2016-02-17 DIAGNOSIS — I358 Other nonrheumatic aortic valve disorders: Secondary | ICD-10-CM | POA: Insufficient documentation

## 2016-02-17 NOTE — Progress Notes (Signed)
Patient ID: Daryl Joseph, male   DOB: 12-28-1932, 82 y.o.   MRN: [id number]    HPI: Daryl Joseph is a 82 y.o. male who presents to the office today for a 9 month cardiology evaluation.  [name] has established CAD and in 1999 underwent CABG surgery with a LIMA to the LAD, vein graft to diagonal, sequential vein graft to the obtuse marginal distal circumflex coronary artery and vein graft to the RCA. 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 In November 2013 he suffered a non-ST segment elevation myocardial infarction and underwent initial catheterization by [name]. I ultimately performed intervention a high-grade ulcerated plaque graft stenosis supplying a diagonal vessel on 06/25/2012 and a 3.5x33 mm  Xience Xpedition DES stent was inserted and post dilated 3.9 mm. Once his graft was opened, the proximal LAD was now significantly improved with reference to being filled from the diagonal vessel.    On a one year followup nuclear perfusion study he had held his metoprolol, amlodipine and isosorbide for the stress test. He did not develop any chest pain. However, he did have an exaggerated hypertensive blood pressure response to exercise and developed 3 mm ST segment depression. Scintigraphic images were low risk and specifically he had normal flow in the diagonal vessel territory.   In December 2014, he was concerned about the price of Brilinta.  Since his been 13 months since his ACS intervention, I switched him to Plavix and P2Y12 testing verifiend plavix sensitivity.   He has been on fenofibrate and Zetia for hyperlipidemia in light of his statin hypersensitivity.  Laboratory in March 2015 revealed a cholesterol was 177, triglycerides 68, HDL 68, LDL 95.  Hemoglobin A1c was 7.0.  He went 21, creatinine 0.88.  Normal LFTs.  He brought with him.  Laboratory which was done in [place], [place] on  04/18/2014.  I reviewed this in detail with him.  He had normal thyroid function studies.  Hemoglobin 14.8, hematocrit 44.4.  Glucose was 142.  LDL cholesterol was 81 with total cholesterol 165, HDL 67, triglycerides 83.  Hemoglobin A1c was increased at 7.4.  PSA was 2.32.  When I saw him in December 2015 he complained of developing similar chest tightness with walking over several weeks, which was somewhat nitrate responsive.  He underwent repeat cardiac catheterization on 08/01/2014 which showedLow normal LV function with an ejection fraction of 50-55% and mild distal inferior hypocontractility. There was severe native CAD with 80% distal left main stenosis and total occlusion of the LAD in its ostium, 95% stenosis in the OM 2 vessel the circumflex with 30% narrowing in the OM1 branch proximal to the graft anastomoses, and total occlusion of the mid RCA. He had a patent LIMA graft supplying the mid LAD with 30-40% smooth mid LAD stenosis beyond the anastomosis., a widely patent stent in the mid distal aspect of the SVG supplying the diagonal vessel without evidence for restenosis., a patent sequential vein graft supplying the OM1 and OM 2 vessel with smooth 20% narrowing in the proximal portion of the sequential limb just beyond the OM1 anastomosis and a patent SVG supplying a dominant RCA with 80% mid PDA stenosis and a small PDA caliber vessel.  Increased medical therapy was recommended with titration of his nitrates and beta blocker and possible initiation of Ranexa.  Over the past year, Mr Stavola has felt well without recurrent anginal symptomatology or significant  shortness of breath on his increased medical regimen.  He denies recurrent anginal symptoms.  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  He has GERD and his Prilosec was changed to Protonix due to potential Plavix interaction.  He denies any presyncope or syncope, PND, orthopnea.  He presents for  evaluation.  Past Medical History  Diagnosis Date  . CAD (coronary artery disease) 03/25/2007    30 d monitor - sinus rhythm w/ some PACs  . Hypertension   . Hyperlipidemia   . Anginal pain ([place])   . NSTEMI (non-ST elevated myocardial infarction) ([place]) 06/18/2012  . DM type 2 (diabetes mellitus, type 2) ([place])   . H/O hiatal hernia   . GERD (gastroesophageal reflux disease)   . Arthritis     "mostly in my hands" (06/25/2012)  . S/P angioplasty with stent, to VG -diag 1, LAD and septal perferator vessels  06/25/2012    Past Surgical History  Procedure Laterality Date  . Cervical disc surgery  1990's  . Cardiac catheterization  06/25/2012    3.5 x 56m Xience Xpedition DES inserted in distal third of LAD  . Coronary angioplasty with stent placement  06/25/2012    "1; first one for me" (06/25/2012)  . Tonsillectomy and adenoidectomy      "when I was a kid" (06/25/2012)  . Coronary artery bypass graft  10/1997    CABG X 4 LIMA to LAD; vein to diagonal; sequential vein to obtuse marginal and distal circumflex and vein to RCA  . Doppler echocardiography  09/23/2011    EF >55%; proximal septal thickening noted; mild septal hypokinesis; ; mild/mod tricuspid regurgitation; aortic root sclerosis/calcification  . Cardiovascular stress test  11/05/2010    R/S MV - EF 73%; post stress LV fcn normal, no significant wall motion abnormalities; perfusion defect in inferior myocardial region consistent w/ diaphragmatic attenuation, remaining myocardium demonstrates normal perfusion; Exercise capacity 10 METS; stress EKG showed changes from baseline EKG; study results unchanged/within normal variance of last study (09/2008)  . Left heart catheterization with coronary/graft angiogram N/A 06/21/2012    Procedure: LEFT HEART CATHETERIZATION WITH CBeatrix Fetters  Surgeon: MSanda Klein MD;  [facility];  Service: Cardiovascular;  Laterality: N/A;  . Percutaneous coronary stent intervention  (pci-s) N/A 06/25/2012    Procedure: PERCUTANEOUS CORONARY STENT INTERVENTION (PCI-S);  Surgeon: TTroy Sine MD;  [facility];  Service: Cardiovascular;  Laterality: N/A;  . Left heart catheterization with coronary/graft angiogram N/A 08/01/2014    Procedure: LEFT HEART CATHETERIZATION WITH CBeatrix Fetters  Surgeon: TTroy Sine MD;  [facility];  Service: Cardiovascular;  Laterality: N/A;    Allergies  Allergen Reactions  . Statins Other (See Comments)    Myositis with statins "affected my liver; I almost had Hepatitis" (06/25/2012)  . Welchol [Colesevelam]     Current Outpatient Prescriptions  Medication Sig Dispense Refill  . amLODipine (NORVASC) 10 MG tablet Take one tablet by mouth daily 90 tablet 1  . aspirin 81 MG tablet Take 81 mg by mouth daily.    . benazepril (LOTENSIN) 40 MG tablet Take 1 tablet (40 mg total) by mouth daily. 90 tablet 3  . clopidogrel (PLAVIX) 75 MG tablet Take 1 tablet (75 mg total) by mouth daily. 90 tablet 3  . ezetimibe (ZETIA) 10 MG tablet Take 1 tablet (10 mg total) by mouth at bedtime. 90 tablet 3  . folic acid (FOLVITE) 1 MG  tablet Take one tablet by mouth daily 90 tablet 1  . isosorbide mononitrate (IMDUR) 60 MG 24 hr tablet TAKE ONE TABLET BY MOUTH DAILY  90 tablet 1  . metFORMIN (GLUCOPHAGE) 500 MG tablet Take 500 mg by mouth 2 (two) times daily with a meal.     . metoprolol succinate (TOPROL-XL) 50 MG 24 hr tablet Take 1 & 1/2 tablets once daily with or immediately following a meal 135 tablet 2  . Multiple Vitamins-Minerals (EYE VITAMINS PO) Take 1 tablet by mouth every evening.    . nitroGLYCERIN (NITROSTAT) 0.4 MG SL tablet Place 1 tablet (0.4 mg total) under the tongue every 5 (five) minutes as needed for chest pain. 25 tablet 2  . pantoprazole (PROTONIX) 40 MG tablet     . fenofibrate 160 MG tablet Take 1 tablet (160 mg total) by mouth daily. 90 tablet 3   No current facility-administered medications for  this visit.    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 He is married with no children. He does not routinely exercise. There is no tobacco or alcohol use. 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  ROS General: Negative; No fevers, chills, or night sweats;  HEENT: Negative; No changes in vision or hearing, sinus congestion, difficulty swallowing Pulmonary: Negative; No cough, wheezing, shortness of breath, hemoptysis Cardiovascular: See history of present illness GI: Negative; No nausea, vomiting, diarrhea, or abdominal pain GU: Positive for history of BPH; No dysuria, hematuria, or difficulty voiding Musculoskeletal: Negative; no myalgias, joint pain, or weakness Hematologic/Oncology: Positive for easy bruisability secondary to antiplatelet therapy Endocrine: Positive for type 2 diabetes mellitus; no heat/cold intolerance;  Neuro: Negative; no changes in balance, headaches Skin: Negative; No rashes or skin lesions Psychiatric: Negative; No behavioral problems, depression Sleep: Negative; No snoring, daytime sleepiness, hypersomnolence, bruxism, restless legs, hypnogognic hallucinations, no cataplexy Other comprehensive 14 point system review is negative.   PE BP 156/78 mmHg  Pulse 70  Ht 5' 11"  (1.803 m)  Wt 150 lb (68.04 kg)  BMI 20.93 kg/m2   Repeat blood pressure 142/80.  He states his blood pressure at home typically runs in the 563O systolically.  Wt Readings from Last 3 Encounters:  02/15/16 150 lb (68.04 kg)  05/08/15 150 lb 12.8 oz (68.402 kg)  08/01/14 154 lb (69.854 kg)   General: Alert, oriented, no distress.  Skin: normal turgor, no rashes. He does have a cyst/lipoma on the back, and also in the right lateral area near the  belt line HEENT: Normocephalic, atraumatic. Pupils round and reactive; sclera anicteric;no lid lag.  Nose without nasal septal hypertrophy Mouth/Parynx benign; Mallinpatti scale 2 Neck: No JVD, no carotid bruits  with normal carotid upstroke Chest wall: Nontender to palpation Lungs: clear to ausculatation and percussion; no wheezing or rales Heart: RRR, s1 s2 normal 2/ 6 systolic murmur, unchanged; no S3 or S4 gallop.  No diastolic murmur, rubs, thrills or heaves to Abdomen: Diastases recti ; soft, nontender; no hepatosplenomehaly, BS+; abdominal aorta nontender and not dilated by palpation. Pulses 2+ Extremities: no clubbing cyanosis or edema, Homan's sign negative  Neurologic: grossly nonfocal Psychological: Normal affect and mood  ECG (independently read by me): Normal sinus rhythm at 70 bpm with first-degree AV block with a PR interval at 270 ms.  Nonspecific T changes  October 2016 ECG (independently read by me): Normal sinus rhythm with mild sinus arrhythmia at 67 bpm.  First-degree AV block with a PR interval at  238 ms.  December 2015 ECG (independently read by me): Sinus rhythm with first-degree AV block with a PR interval at 244 ms.  No significant ST segment changes.  Prior June 2015 ECG: Normal sinus rhythm at 63 beats per minute.  First degree AV block with PR interval at 236 ms.  No significant ST changes.  LABS:  I reviewed recent blood work from 12/17/2015 done in [place], [place].   Glucose was 138.  BUN 17, creatinine 0.91.  LFTs normal. Lipid studies revealed cholesterol 150, HDL 66, triglycerides 78, LDL 68,  HbA1c 7.0  BMP Latest Ref Rng 07/31/2014 06/26/2012 06/25/2012  Glucose 70 - 99 mg/dL 98 132(H) 176(H)  BUN 6 - 23 mg/dL 19 13 17   Creatinine 0.50 - 1.35 mg/dL 0.90 0.79 0.80  Sodium 135 - 145 mEq/L 143 140 139  Potassium 3.5 - 5.3 mEq/L 4.9 4.0 4.6  Chloride 96 - 112 mEq/L 104 106 103  CO2 19 - 32 mEq/L 29 24 25   Calcium 8.4 - 10.5 mg/dL 10.2 9.3 9.9   Hepatic Function Latest Ref Rng 07/31/2014 06/21/2012  Total Protein 6.0 - 8.3 g/dL 7.0 6.7  Albumin 3.5 - 5.2 g/dL 4.7 3.5  AST 0 - 37 U/L 18 34  ALT 0 - 53 U/L 20 27  Alk Phosphatase 39 - 117 U/L 50 65   Total Bilirubin 0.2 - 1.2 mg/dL 0.5 0.4  Bilirubin, Direct 0.0 - 0.3 mg/dL - <0.1   CBC Latest Ref Rng 07/31/2014 06/26/2012 06/25/2012  WBC 4.0 - 10.5 K/uL 8.0 7.7 7.5  Hemoglobin 13.0 - 17.0 g/dL 14.7 13.4 13.9  Hematocrit 39.0 - 52.0 % 43.5 38.7(L) 40.5  Platelets 150 - 400 K/uL 224 187 205   Lab Results  Component Value Date   MCV 86.3 07/31/2014   MCV 86.0 06/26/2012   MCV 87.1 06/25/2012   Lab Results  Component Value Date   TSH 1.168 07/31/2014   Lab Results  Component Value Date   HGBA1C 6.8* 06/18/2012   Lipid Panel     Component Value Date/Time   CHOL 187 07/31/2014 1224   TRIG 199* 07/31/2014 1224   HDL 59 07/31/2014 1224   CHOLHDL 3.2 07/31/2014 1224   VLDL 40 07/31/2014 1224   LDLCALC 88 07/31/2014 1224    RADIOLOGY: No results found.    ASSESSMENT AND PLAN: Mr. Radloff is an 82 year old white male who is 18 years status post CABG revascularization surgery. In  November 2013 he suffered a non-ST segment elevation myocardial infarction secondary to a high-grade stenosis in the graft supplying his diagonal vessel which was successfully intervened on by me.  He was transitioned off Brilinta to Plavix and had verification of Plavix sensitivity by P2Y12 testing.  In December 2015  catheterization showed severe native CAD with patent grafts as noted above.  He is had complete resolution of symptomatology with his increased nitrates and beta blocker therapy.  In the past, he developed marked LFT elevation with Mevacor in the 1980s and almost underwent liver biopsy.  He is not on statin since that time.  He states his blood pressure at home typically is in the 1:30 range and at a recent office visit with his primary M.D., his blood pressure was 138/66.  He continues to be on amlodipine 10 mg, Lotensin 40 mg, isosorbide 60 mg and Toprol-XL 75 mg daily.  His ECG today remains stable.  He is on Zetia 10 mg and fenofibrate 145 mg, lipidemia, and most recent blood work  shows  this to be significantly improved from previously.  He now is on pantoprazole for his GERD which he is tolerating well.  He is diabetic on metformin.  He has a 2/6 systolic murmur in the aortic region which may be due to progressive aortic valve disease.  Prior to his next office visit he will undergo a follow-up echo Doppler study for reevaluation.  I will see him in 6 months for a follow-up office visit.  tion.  Troy Sine, MD, [facility]  02/17/2016 4:44 PM

## 2016-02-19 ENCOUNTER — Other Ambulatory Visit: Payer: Self-pay | Admitting: Cardiovascular Disease

## 2016-03-04 ENCOUNTER — Ambulatory Visit (HOSPITAL_COMMUNITY): Payer: Medicare Other | Attending: Internal Medicine

## 2016-03-04 ENCOUNTER — Other Ambulatory Visit (HOSPITAL_COMMUNITY): Payer: Self-pay

## 2016-03-04 DIAGNOSIS — R011 Cardiac murmur, unspecified: Secondary | ICD-10-CM | POA: Diagnosis not present

## 2016-03-04 DIAGNOSIS — I340 Nonrheumatic mitral (valve) insufficiency: Secondary | ICD-10-CM | POA: Insufficient documentation

## 2016-03-04 DIAGNOSIS — I25118 Atherosclerotic heart disease of native coronary artery with other forms of angina pectoris: Secondary | ICD-10-CM | POA: Diagnosis not present

## 2016-03-04 DIAGNOSIS — I517 Cardiomegaly: Secondary | ICD-10-CM | POA: Insufficient documentation

## 2016-03-04 DIAGNOSIS — I10 Essential (primary) hypertension: Secondary | ICD-10-CM | POA: Diagnosis not present

## 2016-03-04 DIAGNOSIS — I071 Rheumatic tricuspid insufficiency: Secondary | ICD-10-CM | POA: Insufficient documentation

## 2016-03-04 LAB — ECHOCARDIOGRAM COMPLETE
AO mean calculated velocity dopler: 96.7 cm/s
AV Area VTI index: 1.06 cm2/m2
AV Area VTI: 2.01 cm2
AV Area mean vel: 2.08 cm2
AV Mean grad: 5 mmHg
AV Peak grad: 11 mmHg
AV VEL mean LVOT/AV: 0.66
AV area mean vel ind: 1.13 cm2/m2
AV peak Index: 1.09
AV pk vel: 164 cm/s
AV vel: 1.95
Ao pk vel: 0.64 m/s
E decel time: 194 ms
E/e' ratio: 11.46
FS: 27 % — AB (ref 28–44)
IVS/LV PW RATIO, ED: 1.02
LA ID, A-P, ES: 37 mm
LA diam end sys: 37 mm
LA diam index: 2.01 cm/m2
LA vol A4C: 41.4 mL
LA vol index: 22.8 mL/m2
LA vol: 41.9 mL
LV E/e' medial: 11.46
LV E/e'average: 11.46
LV PW d: 10.6 mm — AB (ref 0.6–1.1)
LV e' LATERAL: 6.31 cm/s
LVOT SV: 71 mL
LVOT VTI: 22.6 cm
LVOT area: 3.14 cm2
LVOT diameter: 20 mm
LVOT peak VTI: 0.62 cm
LVOT peak vel: 105 cm/s
Lateral S' vel: 9.46 cm/s
MV Dec: 194
MV Peak grad: 2 mmHg
MV pk A vel: 75.2 m/s
MV pk E vel: 72.3 m/s
Peak grad: 210 mmHg
RV sys press: 21 mmHg
Reg peak vel: 210 cm/s
TAPSE: 9.99 mm
TDI e' lateral: 6.31
TDI e' medial: 7.4
TR max vel: 210 cm/s
VTI: 36.3 cm
Valve area index: 1.06
Valve area: 1.95 cm2

## 2016-05-30 ENCOUNTER — Other Ambulatory Visit: Payer: Self-pay | Admitting: Cardiovascular Disease

## 2016-06-09 ENCOUNTER — Other Ambulatory Visit: Payer: Self-pay | Admitting: Cardiovascular Disease

## 2016-06-13 ENCOUNTER — Other Ambulatory Visit: Payer: Self-pay | Admitting: Cardiovascular Disease

## 2016-06-20 ENCOUNTER — Other Ambulatory Visit: Payer: Self-pay | Admitting: Cardiovascular Disease

## 2016-06-20 NOTE — Telephone Encounter (Signed)
Rx request sent to pharmacy.  

## 2016-07-18 ENCOUNTER — Other Ambulatory Visit: Payer: Self-pay

## 2016-07-18 MED ORDER — EZETIMIBE 10 MG PO TABS: 10.0000 mg | ORAL_TABLET | Freq: Every day | ORAL | 2 refills | Status: DC

## 2016-07-18 MED ORDER — CLOPIDOGREL BISULFATE 75 MG PO TABS: 75.0000 mg | ORAL_TABLET | Freq: Every day | ORAL | 2 refills | Status: DC

## 2016-07-18 MED ORDER — FENOFIBRATE 160 MG PO TABS: 160.0000 mg | ORAL_TABLET | Freq: Every day | ORAL | 2 refills | Status: DC

## 2016-07-18 MED ORDER — AMLODIPINE BESYLATE 10 MG PO TABS: 10.0000 mg | ORAL_TABLET | Freq: Every day | ORAL | 2 refills | Status: DC

## 2016-07-18 MED ORDER — METOPROLOL SUCCINATE ER 50 MG PO TB24: ORAL_TABLET | ORAL | 2 refills | Status: DC

## 2016-07-18 MED ORDER — BENAZEPRIL HCL 40 MG PO TABS: 40.0000 mg | ORAL_TABLET | Freq: Every day | ORAL | 2 refills | Status: DC

## 2016-08-06 ENCOUNTER — Other Ambulatory Visit: Payer: Self-pay

## 2016-08-06 MED ORDER — ISOSORBIDE MONONITRATE ER 60 MG PO TB24: 60.0000 mg | ORAL_TABLET | Freq: Every day | ORAL | 1 refills | Status: DC

## 2016-08-27 ENCOUNTER — Encounter: Payer: Self-pay | Admitting: Cardiovascular Disease

## 2016-08-27 ENCOUNTER — Ambulatory Visit (INDEPENDENT_AMBULATORY_CARE_PROVIDER_SITE_OTHER): Payer: Medicare Other | Admitting: Cardiovascular Disease

## 2016-08-27 VITALS — BP 148/62 | HR 69 | Ht 70.5 in | Wt 155.2 lb

## 2016-08-27 DIAGNOSIS — I2583 Coronary atherosclerosis due to lipid rich plaque: Secondary | ICD-10-CM

## 2016-08-27 DIAGNOSIS — I10 Essential (primary) hypertension: Secondary | ICD-10-CM

## 2016-08-27 DIAGNOSIS — E782 Mixed hyperlipidemia: Secondary | ICD-10-CM

## 2016-08-27 DIAGNOSIS — K219 Gastro-esophageal reflux disease without esophagitis: Secondary | ICD-10-CM

## 2016-08-27 DIAGNOSIS — I358 Other nonrheumatic aortic valve disorders: Secondary | ICD-10-CM | POA: Diagnosis not present

## 2016-08-27 DIAGNOSIS — E785 Hyperlipidemia, unspecified: Secondary | ICD-10-CM

## 2016-08-27 DIAGNOSIS — E1159 Type 2 diabetes mellitus with other circulatory complications: Secondary | ICD-10-CM

## 2016-08-27 DIAGNOSIS — I2510 Atherosclerotic heart disease of native coronary artery without angina pectoris: Secondary | ICD-10-CM | POA: Diagnosis not present

## 2016-08-27 NOTE — Progress Notes (Addendum)
Patient ID: Daryl Joseph Joseph, male   DOB: November 11, 1932, 83 y.o.   MRN: [id number]    HPI: Daryl Joseph Joseph is a 83 y.o. male who presents to the office today for a 6 month cardiology evaluation.  Daryl Joseph. [name] has established CAD and in 1999 underwent CABG surgery with a LIMA to the LAD, vein graft to diagonal, sequential vein graft to the obtuse marginal distal circumflex coronary artery and vein graft to the RCA. 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 In November 2013 he suffered a non-ST segment elevation myocardial infarction and underwent initial catheterization by [name]. I ultimately performed intervention a high-grade ulcerated plaque graft stenosis supplying a diagonal vessel on 06/25/2012 and a 3.5x33 mm  Xience Xpedition DES stent was inserted and post dilated 3.9 mm. Once his graft was opened, the proximal LAD was now significantly improved with reference to being filled from the diagonal vessel.    On a one year followup nuclear perfusion study he had held his metoprolol, amlodipine and isosorbide for the stress test. He did not develop any chest pain. However, he did have an exaggerated hypertensive blood pressure response to exercise and developed 3 mm ST segment depression. Scintigraphic images were low risk and specifically he had normal flow in the diagonal vessel territory.   In December 2014, he was concerned about the price of Brilinta.  Since his been 13 months since his ACS intervention, I switched him to Plavix and P2Y12 testing verifiend plavix sensitivity.   He has been on fenofibrate and Zetia for hyperlipidemia in light of his statin hypersensitivity.  Laboratory in March 2015 revealed a cholesterol was 177, triglycerides 68, HDL 68, LDL 95.  Hemoglobin A1c was 7.0.  He went 21, creatinine 0.88.  Normal LFTs.  He brought with him.  Laboratory which was done in [place], [place] on  04/18/2014.  I reviewed this in detail with him.  He had normal thyroid function studies.  Hemoglobin 14.8, hematocrit 44.4.  Glucose was 142.  LDL cholesterol was 81 with total cholesterol 165, HDL 67, triglycerides 83.  Hemoglobin A1c was increased at 7.4.  PSA was 2.32.  When I saw him in December 2015 he complained of developing similar chest tightness with walking over several weeks, which was somewhat nitrate responsive.  He underwent repeat cardiac catheterization on 08/01/2014 which showedLow normal LV function with an ejection fraction of 50-55% and mild distal inferior hypocontractility. There was severe native CAD with 80% distal left main stenosis and total occlusion of the LAD in its ostium, 95% stenosis in the OM 2 vessel the circumflex with 30% narrowing in the OM1 branch proximal to the graft anastomoses, and total occlusion of the mid RCA. He had a patent LIMA graft supplying the mid LAD with 30-40% smooth mid LAD stenosis beyond the anastomosis., a widely patent stent in the mid distal aspect of the SVG supplying the diagonal vessel without evidence for restenosis., a patent sequential vein graft supplying the OM1 and OM 2 vessel with smooth 20% narrowing in the proximal portion of the sequential limb just beyond the OM1 anastomosis and a patent SVG supplying a dominant RCA with 80% mid PDA stenosis and a small PDA caliber vessel.  Increased medical therapy was recommended with titration of his nitrates and beta blocker and possible initiation of Ranexa.  Since I last saw him, Daryl Joseph Joseph has felt well without recurrent anginal symptomatology or  significant shortness of breath on his increased medical regimen.  He denies recurrent anginal symptoms.  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  He has GERD and his Prilosec was changed to Protonix due to potential Plavix interaction.  He denies any presyncope or syncope, PND, orthopnea.    An echo Doppler study  was done on August 1 17 to further evaluate his cardiac murmur.  He had normal systolic function with grade 1 diastolic dysfunction.  There was mild aortic sclerosis.  Mean gradient 5 peak gradient 11.  There was mild Daryl Joseph.  Had normal pulmonic pressures.  There was mild TR.    He underwent laboratory by his primary physician in November 2017.  Glucose was increased at 146.  BUN was 20 and creatinine 0.96.  Liver function studies were normal.  TSH was normal at 1.45.  CBC was normal.  Lipid studies revealed a total cholesterol 159, triglycerides 65, HDL 73, and LDL 73.  Hemoglobin A1c was minimally increased at 6.6.  PSA was normal.  He presents for evaluation.  Past Medical History:  Diagnosis Date  . Anginal pain ([place])   . Arthritis    "mostly in my hands" (06/25/2012)  . CAD (coronary artery disease) 03/25/2007   30 d monitor - sinus rhythm w/ some PACs  . DM type 2 (diabetes mellitus, type 2) ([place])   . GERD (gastroesophageal reflux disease)   . H/O hiatal hernia   . Hyperlipidemia   . Hypertension   . NSTEMI (non-ST elevated myocardial infarction) ([place]) 06/18/2012  . S/P angioplasty with stent, to VG -diag 1, LAD and septal perferator vessels  06/25/2012    Past Surgical History:  Procedure Laterality Date  . CARDIAC CATHETERIZATION  06/25/2012   3.5 x 40m Xience Xpedition DES inserted in distal third of LAD  . CARDIOVASCULAR STRESS TEST  11/05/2010   R/S MV - EF 73%; post stress LV fcn normal, no significant wall motion abnormalities; perfusion defect in inferior myocardial region consistent w/ diaphragmatic attenuation, remaining myocardium demonstrates normal perfusion; Exercise capacity 10 METS; stress EKG showed changes from baseline EKG; study results unchanged/within normal variance of last study (09/2008)  . CERVICAL DISC SURGERY  1990's  . CORONARY ANGIOPLASTY WITH STENT PLACEMENT  06/25/2012   "1; first one for me" (06/25/2012)  . CORONARY ARTERY BYPASS GRAFT  10/1997   CABG X 4  LIMA to LAD; vein to diagonal; sequential vein to obtuse marginal and distal circumflex and vein to RCA  . DOPPLER ECHOCARDIOGRAPHY  09/23/2011   EF >55%; proximal septal thickening noted; mild septal hypokinesis; ; mild/mod tricuspid regurgitation; aortic root sclerosis/calcification  . LEFT HEART CATHETERIZATION WITH CORONARY/GRAFT ANGIOGRAM N/A 06/21/2012   Procedure: LEFT HEART CATHETERIZATION WITH CBeatrix Fetters  Surgeon: MSanda Klein MD;  [facility];  Service: Cardiovascular;  Laterality: N/A;  . LEFT HEART CATHETERIZATION WITH CORONARY/GRAFT ANGIOGRAM N/A 08/01/2014   Procedure: LEFT HEART CATHETERIZATION WITH CBeatrix Fetters  Surgeon: TTroy Sine MD;  [facility];  Service: Cardiovascular;  Laterality: N/A;  . PERCUTANEOUS CORONARY STENT INTERVENTION (PCI-S) N/A 06/25/2012   Procedure: PERCUTANEOUS CORONARY STENT INTERVENTION (PCI-S);  Surgeon: TTroy Sine MD;  [facility];  Service: Cardiovascular;  Laterality: N/A;  . TONSILLECTOMY AND ADENOIDECTOMY     "when I was a kid" (06/25/2012)    Allergies  Allergen Reactions  . Statins Other (See Comments)    Myositis with statins "affected my  liver; I almost had Hepatitis" (06/25/2012)  . Welchol [Colesevelam]     Current Outpatient Prescriptions  Medication Sig Dispense Refill  . amLODipine (NORVASC) 10 MG tablet Take 1 tablet (10 mg total) by mouth daily. 90 tablet 2  . aspirin 81 MG tablet Take 81 mg by mouth daily.    . benazepril (LOTENSIN) 40 MG tablet Take 1 tablet (40 mg total) by mouth daily. 90 tablet 2  . clopidogrel (PLAVIX) 75 MG tablet Take 1 tablet (75 mg total) by mouth daily. 90 tablet 2  . ezetimibe (ZETIA) 10 MG tablet Take 1 tablet (10 mg total) by mouth at bedtime. 90 tablet 2  . fenofibrate 160 MG tablet Take 1 tablet (160 mg total) by mouth daily. 90 tablet 2  . folic acid (FOLVITE) 1 MG tablet Take one tablet by mouth daily 90 tablet 1  . isosorbide  mononitrate (IMDUR) 60 MG 24 hr tablet Take 1 tablet (60 mg total) by mouth daily. 90 tablet 1  . metFORMIN (GLUCOPHAGE) 500 MG tablet Take 500 mg by mouth 2 (two) times daily with a meal.     . metoprolol succinate (TOPROL-XL) 50 MG 24 hr tablet Take 1.5 tablets once a day. Take with or immediately following a meal. 135 tablet 2  . Multiple Vitamins-Minerals (EYE VITAMINS PO) Take 1 tablet by mouth every evening.    . nitroGLYCERIN (NITROSTAT) 0.4 MG SL tablet Place 1 tablet (0.4 mg total) under the tongue every 5 (five) minutes as needed for chest pain. 25 tablet 2  . pantoprazole (PROTONIX) 40 MG tablet      No current facility-administered medications for this visit.     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 He is married with no children. He does not routinely exercise. There is no tobacco or alcohol use. 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  ROS General: Negative; No fevers, chills, or night sweats;  HEENT: Negative; No changes in vision or hearing, sinus congestion, difficulty swallowing Pulmonary: Negative; No cough, wheezing, shortness of breath, hemoptysis Cardiovascular: See history of present illness GI: Negative; No nausea, vomiting, diarrhea, or abdominal pain GU: Positive for history of BPH; No dysuria, hematuria, or difficulty voiding Musculoskeletal: Negative; no myalgias, joint pain, or weakness Hematologic/Oncology: Positive for easy bruisability secondary to antiplatelet therapy Endocrine: Positive for type 2 diabetes mellitus; no heat/cold intolerance;  Neuro: Negative; no changes in balance, headaches Skin: Negative; No rashes or skin lesions Psychiatric: Negative; No behavioral problems, depression Sleep: Negative; No snoring, daytime sleepiness, hypersomnolence, bruxism, restless legs, hypnogognic hallucinations, no cataplexy Other comprehensive 14 point system review is negative.   PE BP (!) 148/62   Pulse 69   Ht 5' 10.5"  (1.791 m)   Wt 155 lb 3.2 oz (70.4 kg)   BMI 21.95 kg/m    Repeat blood pressure 140/78.  He states his blood pressure at home typically runs in the 025K systolically.  Wt Readings from Last 3 Encounters:  08/27/16 155 lb 3.2 oz (70.4 kg)  02/15/16 150 lb (68 kg)  05/08/15 150 lb 12.8 oz (68.4 kg)   General: Alert, oriented, no distress.  Skin: normal turgor, no rashes. He does have a cyst/lipoma on the back, and also in the right lateral area near the  belt line HEENT: Normocephalic, atraumatic. Pupils round and reactive; sclera anicteric;no lid lag.  Nose without nasal septal hypertrophy Mouth/Parynx benign; Mallinpatti scale 2 Neck: No JVD, no carotid bruits with normal  carotid upstroke Chest wall: Nontender to palpation Lungs: clear to ausculatation and percussion; no wheezing or rales Heart: RRR, s1 s2 normal 2/ 6 systolic murmur, unchanged; no S3 or S4 gallop.  No diastolic murmur, rubs, thrills or heaves to Abdomen: Diastases recti ; soft, nontender; no hepatosplenomehaly, BS+; abdominal aorta nontender and not dilated by palpation. Pulses 2+ Extremities: no clubbing cyanosis or edema, Homan's sign negative  Neurologic: grossly nonfocal Psychological: Normal affect and mood  ECG (independently read by me): Normal sinus rhythm at 70 bpm with first-degree AV block with a PR interval at 270 ms.  Nonspecific T changes  October 2016 ECG (independently read by me): Normal sinus rhythm with mild sinus arrhythmia at 67 bpm.  First-degree AV block with a PR interval at 238 ms.  December 2015 ECG (independently read by me): Sinus rhythm with first-degree AV block with a PR interval at 244 ms.  No significant ST segment changes.  Prior June 2015 ECG: Normal sinus rhythm at 63 beats per minute.  First degree AV block with PR interval at 236 ms.  No significant ST changes.  LABS:  I reviewed recent blood work from 12/17/2015 done in [place], [place].   Glucose was 138.  BUN 17,  creatinine 0.91.  LFTs normal. Lipid studies revealed cholesterol 150, HDL 66, triglycerides 78, LDL 68,  HbA1c 7.0  November 2017.  Glucose was increased at 146.  BUN was 20 and creatinine 0.96.  Liver function studies were normal.  TSH was normal at 1.45.  CBC was normal.  Lipid studies revealed a total cholesterol 159, triglycerides 65, HDL 73, and LDL 73.  Hemoglobin A1c was minimally increased at 6.6.  PSA was normal.  BMP Latest Ref Rng & Units 07/31/2014 06/26/2012 06/25/2012  Glucose 70 - 99 mg/dL 98 132(H) 176(H)  BUN 6 - 23 mg/dL 19 13 17   Creatinine 0.50 - 1.35 mg/dL 0.90 0.79 0.80  Sodium 135 - 145 mEq/L 143 140 139  Potassium 3.5 - 5.3 mEq/L 4.9 4.0 4.6  Chloride 96 - 112 mEq/L 104 106 103  CO2 19 - 32 mEq/L 29 24 25   Calcium 8.4 - 10.5 mg/dL 10.2 9.3 9.9   Hepatic Function Latest Ref Rng & Units 07/31/2014 06/21/2012  Total Protein 6.0 - 8.3 g/dL 7.0 6.7  Albumin 3.5 - 5.2 g/dL 4.7 3.5  AST 0 - 37 U/L 18 34  ALT 0 - 53 U/L 20 27  Alk Phosphatase 39 - 117 U/L 50 65  Total Bilirubin 0.2 - 1.2 mg/dL 0.5 0.4  Bilirubin, Direct 0.0 - 0.3 mg/dL - <0.1   CBC Latest Ref Rng & Units 07/31/2014 06/26/2012 06/25/2012  WBC 4.0 - 10.5 K/uL 8.0 7.7 7.5  Hemoglobin 13.0 - 17.0 g/dL 14.7 13.4 13.9  Hematocrit 39.0 - 52.0 % 43.5 38.7(L) 40.5  Platelets 150 - 400 K/uL 224 187 205   Lab Results  Component Value Date   MCV 86.3 07/31/2014   MCV 86.0 06/26/2012   MCV 87.1 06/25/2012   Lab Results  Component Value Date   TSH 1.168 07/31/2014   Lab Results  Component Value Date   HGBA1C 6.8 (H) 06/18/2012   Lipid Panel     Component Value Date/Time   CHOL 187 07/31/2014 1224   TRIG 199 (H) 07/31/2014 1224   HDL 59 07/31/2014 1224   CHOLHDL 3.2 07/31/2014 1224   VLDL 40 07/31/2014 1224   LDLCALC 88 07/31/2014 1224    RADIOLOGY: No results found.  IMPRESSION:  1. Coronary artery disease due to lipid rich plaque   2. Essential hypertension   3. Systolic murmur of  aorta   4. Mixed hyperlipidemia   5. Hyperlipidemia LDL goal <70   6. Gastroesophageal reflux disease without esophagitis   7. Type 2 diabetes mellitus with other circulatory complication, without long-term current use of insulin (H[facility])     ASSESSMENT AND PLAN: Daryl Joseph Joseph is an 83 year old white male who is status post CABG revascularization surgery in 1999. In  November 2013 he suffered a non-ST segment elevation myocardial infarction secondary to a high-grade stenosis in the graft supplying his diagonal vessel which was successfully intervened on by me.  He was transitioned off Brilinta to Plavix and had verification of Plavix sensitivity by P2Y12 testing.  In December 2015  catheterization showed severe native CAD with patent grafts as noted above.  He  had complete resolution of symptomatology with his increased nitrates and beta blocker therapy.  In the past, he developed marked LFT elevation with Mevacor in the 1980s and almost underwent liver biopsy.  He is not on statin since that time.  On exam today, his blood pressure is borderline elevated.  He has continued to take amlodipine 10 mg, benazapril 40 mg, Toprol-XL 50 mg, and isosorbide 60 mg daily.  He does not have any signs of edema.  He continues to be on aspirin and Plavix for antiplatelet therapy and denies bleeding.  I reviewed his laboratory.  He is on Zetia 10 mg and fenofibrate 160 mg for his hyperlipidemia.  In the past.  He developed significant transaminitis on lovastatin in 1987.  His GERD is controlled on pantoprazole.  We discussed his diabetes.  Hemoglobin A1c is 6.6, which is improved from recently.  We discussed the importance of continued exercise at least 5 days per week for 30 minutes time.  Not having any anginal symptomatology on his current medical regimen.  His ECG remained stable with first-degree AV block and nonspecific T changes.  I will see him in 6 months for reevaluation.  Time spent: 25 minutes  Troy Sine, MD,  [facility]  08/27/2016 5:09 PM

## 2016-08-27 NOTE — Patient Instructions (Signed)
Medication Instructions:  Your physician recommends that you continue on your current medications as directed. Please refer to the Current Medication list given to you today.  Labwork: NONE   Testing/Procedures: NONE   Follow-Up: Your physician wants you to follow-up in: 12 MONTHS WITH DR KELLY. You will receive a reminder letter in the mail two months in advance. If you don't receive a letter, please call our office to schedule the follow-up appointment.  Any Other Special Instructions Will Be Listed Below (If Applicable).     If you need a refill on your cardiac medications before your next appointment, please call your pharmacy.  

## 2016-12-10 ENCOUNTER — Other Ambulatory Visit: Payer: Self-pay | Admitting: Cardiovascular Disease

## 2016-12-10 NOTE — Telephone Encounter (Signed)
REFILL 

## 2017-03-01 ENCOUNTER — Other Ambulatory Visit: Payer: Self-pay | Admitting: Cardiovascular Disease

## 2017-04-27 ENCOUNTER — Other Ambulatory Visit: Payer: Self-pay | Admitting: Cardiovascular Disease

## 2017-05-28 ENCOUNTER — Other Ambulatory Visit: Payer: Self-pay | Admitting: Cardiovascular Disease

## 2017-06-04 ENCOUNTER — Other Ambulatory Visit: Payer: Self-pay | Admitting: Cardiovascular Disease

## 2017-06-04 NOTE — Telephone Encounter (Signed)
Rx has been sent to the pharmacy electronically. ° °

## 2017-09-07 ENCOUNTER — Ambulatory Visit: Payer: Medicare Other | Admitting: Cardiovascular Disease

## 2017-09-07 ENCOUNTER — Encounter: Payer: Self-pay | Admitting: Cardiovascular Disease

## 2017-09-07 VITALS — BP 146/74 | HR 73 | Resp 16 | Ht 71.0 in | Wt 156.4 lb

## 2017-09-07 DIAGNOSIS — E1159 Type 2 diabetes mellitus with other circulatory complications: Secondary | ICD-10-CM

## 2017-09-07 DIAGNOSIS — E782 Mixed hyperlipidemia: Secondary | ICD-10-CM

## 2017-09-07 DIAGNOSIS — D171 Benign lipomatous neoplasm of skin and subcutaneous tissue of trunk: Secondary | ICD-10-CM | POA: Diagnosis not present

## 2017-09-07 DIAGNOSIS — K219 Gastro-esophageal reflux disease without esophagitis: Secondary | ICD-10-CM

## 2017-09-07 DIAGNOSIS — I358 Other nonrheumatic aortic valve disorders: Secondary | ICD-10-CM

## 2017-09-07 DIAGNOSIS — I10 Essential (primary) hypertension: Secondary | ICD-10-CM

## 2017-09-07 DIAGNOSIS — Z789 Other specified health status: Secondary | ICD-10-CM

## 2017-09-07 DIAGNOSIS — I2583 Coronary atherosclerosis due to lipid rich plaque: Secondary | ICD-10-CM | POA: Diagnosis not present

## 2017-09-07 DIAGNOSIS — I2510 Atherosclerotic heart disease of native coronary artery without angina pectoris: Secondary | ICD-10-CM

## 2017-09-07 NOTE — Patient Instructions (Signed)
Medication Instructions:  Your physician recommends that you continue on your current medications as directed. Please refer to the Current Medication list given to you today.  Follow-Up: Your physician wants you to follow-up in: 12 months with Dr. Kelly.  You will receive a reminder letter in the mail two months in advance. If you don't receive a letter, please call our office to schedule the follow-up appointment.   If you need a refill on your cardiac medications before your next appointment, please call your pharmacy.   

## 2017-09-07 NOTE — Progress Notes (Signed)
Patient ID: Daryl Joseph, male   DOB: June 22, 1933, 84 y.o.   MRN: [id number]    HPI: Daryl Joseph is a 84 y.o. male who presents to the office today for a 64 month cardiology evaluation.  Daryl Joseph has established CAD and in 1999 underwent CABG surgery with a LIMA to the LAD, vein graft to diagonal, sequential vein graft to the obtuse marginal distal circumflex coronary artery and vein graft to the RCA. 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 In November 2013 he suffered a non-ST segment elevation myocardial infarction and underwent initial catheterization by [name]. I ultimately performed intervention a high-grade ulcerated plaque graft stenosis supplying a diagonal vessel on 06/25/2012 and a 3.5x33 mm  Xience Xpedition DES stent was inserted and post dilated 3.9 mm. Once his graft was opened, the proximal LAD was now significantly improved with reference to being filled from the diagonal vessel.    On a one year followup nuclear perfusion study he had held his metoprolol, amlodipine and isosorbide for the stress test. He did not develop any chest pain. However, he did have an exaggerated hypertensive blood pressure response to exercise and developed 3 mm ST segment depression. Scintigraphic images were low risk and specifically he had normal flow in the diagonal vessel territory.   In December 2014, he was concerned about the price of Brilinta.  Since his been 13 months since his ACS intervention, I switched him to Plavix and P2Y12 testing verifiend plavix sensitivity.   He has been on fenofibrate and Zetia for hyperlipidemia in light of his statin hypersensitivity.  Laboratory in March 2015 revealed a cholesterol was 177, triglycerides 68, HDL 68, LDL 95.  Hemoglobin A1c was 7.0.  He went 21, creatinine 0.88.  Normal LFTs.  He brought with him.  Laboratory which was done in [place], [place] on  04/18/2014.  I reviewed this in detail with him.  He had normal thyroid function studies.  Hemoglobin 14.8, hematocrit 44.4.  Glucose was 142.  LDL cholesterol was 81 with total cholesterol 165, HDL 67, triglycerides 83.  Hemoglobin A1c was increased at 7.4.  PSA was 2.32.  When I saw him in December 2015 he complained of developing similar chest tightness with walking over several weeks, which was somewhat nitrate responsive.  He underwent repeat cardiac catheterization on 08/01/2014 which showedLow normal LV function with an ejection fraction of 50-55% and mild distal inferior hypocontractility. There was severe native CAD with 80% distal left main stenosis and total occlusion of the LAD in its ostium, 95% stenosis in the OM 2 vessel the circumflex with 30% narrowing in the OM1 branch proximal to the graft anastomoses, and total occlusion of the mid RCA. He had a patent LIMA graft supplying the mid LAD with 30-40% smooth mid LAD stenosis beyond the anastomosis., a widely patent stent in the mid distal aspect of the SVG supplying the diagonal vessel without evidence for restenosis., a patent sequential vein graft supplying the OM1 and OM 2 vessel with smooth 20% narrowing in the proximal portion of the sequential limb just beyond the OM1 anastomosis and a patent SVG supplying a dominant RCA with 80% mid PDA stenosis and a small PDA caliber vessel.  Increased medical therapy was recommended with titration of his nitrates and beta blocker and possible initiation of Ranexa.  Since I last saw him, Daryl Joseph has felt well without recurrent anginal symptomatology or  significant shortness of breath on his increased medical regimen.  He denies recurrent anginal symptoms.  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  He has GERD and his Prilosec was changed to Protonix due to potential Plavix interaction.  He denies any presyncope or syncope, PND, orthopnea.    An echo Doppler study  in August 2017 to further evaluate his cardiac murmur demonstrated normal systolic function with grade 1 diastolic dysfunction.  There was mild aortic sclerosis.  Mean gradient 5 peak gradient 11.  There was mild Daryl.  Had normal pulmonic pressures.  There was mild TR.    He underwent laboratory by his primary physician in November 2017.  Glucose was increased at 146.  BUN was 20 and creatinine 0.96.  Liver function studies were normal.  TSH was normal at 1.45.  CBC was normal.  Lipid studies revealed a total cholesterol 159, triglycerides 65, HDL 73, and LDL 73.  Hemoglobin A1c was minimally increased at 6.6.  PSA was normal.    Since I last saw him one year ago, he has remained active.  He has had issues with bursitis and arthritis of his hip and has undergone 3 steroid injections.  When he had had laboratory in November 2018 at the time of his steroid injections.  This contributed to his glucose being elevated at 142, and hemoglobin A1c increased at 7.6.  He has continued to be on Zetia for hyperlipidemia, and remotely had LFT elevation with Mevacor.  Lipid studies were very good on Zetia with a total cholesterol 156, triglycerides 54, HDL 79, and LDL 66.  Head normal LFTs and renal function.  He denies chest pain, PND, orthopnea.  He does not answer sodium to his food, but has been eating [place] food recently with  monosodium glutamate and has been having some canned soups.  He presents for evaluation.  Past Medical History:  Diagnosis Date  . Anginal pain ([place])   . Arthritis    "mostly in my hands" (06/25/2012)  . CAD (coronary artery disease) 03/25/2007   30 d monitor - sinus rhythm w/ some PACs  . DM type 2 (diabetes mellitus, type 2) ([place])   . GERD (gastroesophageal reflux disease)   . H/O hiatal hernia   . Hyperlipidemia   . Hypertension   . NSTEMI (non-ST elevated myocardial infarction) ([place]) 06/18/2012  . S/P angioplasty with stent, to VG -diag 1, LAD and septal perferator vessels   06/25/2012    Past Surgical History:  Procedure Laterality Date  . CARDIAC CATHETERIZATION  06/25/2012   3.5 x 79m Xience Xpedition DES inserted in distal third of LAD  . CARDIOVASCULAR STRESS TEST  11/05/2010   R/S MV - EF 73%; post stress LV fcn normal, no significant wall motion abnormalities; perfusion defect in inferior myocardial region consistent w/ diaphragmatic attenuation, remaining myocardium demonstrates normal perfusion; Exercise capacity 10 METS; stress EKG showed changes from baseline EKG; study results unchanged/within normal variance of last study (09/2008)  . CERVICAL DISC SURGERY  1990's  . CORONARY ANGIOPLASTY WITH STENT PLACEMENT  06/25/2012   "1; first one for me" (06/25/2012)  . CORONARY ARTERY BYPASS GRAFT  10/1997   CABG X 4 LIMA to LAD; vein to diagonal; sequential vein to obtuse marginal and distal circumflex and vein to RCA  . DOPPLER ECHOCARDIOGRAPHY  09/23/2011   EF >55%; proximal septal thickening noted; mild septal hypokinesis; ; mild/mod tricuspid regurgitation; aortic root sclerosis/calcification  . LEFT  HEART CATHETERIZATION WITH CORONARY/GRAFT ANGIOGRAM N/A 06/21/2012   Procedure: LEFT HEART CATHETERIZATION WITH Beatrix Fetters;  Surgeon: Sanda Klein, MD;  Location: [facility] CATH LAB;  Service: Cardiovascular;  Laterality: N/A;  . LEFT HEART CATHETERIZATION WITH CORONARY/GRAFT ANGIOGRAM N/A 08/01/2014   Procedure: LEFT HEART CATHETERIZATION WITH Beatrix Fetters;  Surgeon: Troy Sine, MD;  Location: [facility] CATH LAB;  Service: Cardiovascular;  Laterality: N/A;  . PERCUTANEOUS CORONARY STENT INTERVENTION (PCI-S) N/A 06/25/2012   Procedure: PERCUTANEOUS CORONARY STENT INTERVENTION (PCI-S);  Surgeon: Troy Sine, MD;  Location: [facility] CATH LAB;  Service: Cardiovascular;  Laterality: N/A;  . TONSILLECTOMY AND ADENOIDECTOMY     "when I was a kid" (06/25/2012)    Allergies  Allergen Reactions  . Statins Other (See Comments)    Myositis with  statins "affected my liver; I almost had Hepatitis" (06/25/2012)  . Welchol [Colesevelam]     Current Outpatient Medications  Medication Sig Dispense Refill  . amLODipine (NORVASC) 10 MG tablet TAKE 1 TABLET BY MOUTH  DAILY 90 tablet 1  . aspirin 81 MG tablet Take 81 mg by mouth daily.    . benazepril (LOTENSIN) 40 MG tablet TAKE 1 TABLET BY MOUTH  DAILY 90 tablet 1  . clopidogrel (PLAVIX) 75 MG tablet TAKE 1 TABLET BY MOUTH  DAILY 90 tablet 1  . ezetimibe (ZETIA) 10 MG tablet TAKE 1 TABLET BY MOUTH AT  BEDTIME 90 tablet 1  . fenofibrate 160 MG tablet TAKE 1 TABLET BY MOUTH  DAILY 90 tablet 0  . folic acid (FOLVITE) 1 MG tablet Take one tablet by mouth daily 90 tablet 1  . isosorbide mononitrate (IMDUR) 60 MG 24 hr tablet TAKE 1 TABLET BY MOUTH  DAILY 90 tablet 3  . ketorolac (TORADOL) 10 MG tablet Take 10 mg by mouth every 6 (six) hours as needed.    . metFORMIN (GLUCOPHAGE) 500 MG tablet Take 500 mg by mouth 2 (two) times daily with a meal.     . metoprolol succinate (TOPROL-XL) 50 MG 24 hr tablet TAKE 1.5 TABLETS ONCE A  DAY. TAKE WITH OR  IMMEDIATELY FOLLOWING A  MEAL. 135 tablet 1  . Multiple Vitamins-Minerals (EYE VITAMINS PO) Take 1 tablet by mouth every evening.    Marland Kitchen NITROSTAT 0.4 MG SL tablet Place 1 tablet (0.4 mg total) under the tongue every 5 (five) minutes as needed for chest pain. 25 tablet 1  . pantoprazole (PROTONIX) 40 MG tablet      No current facility-administered medications for this visit.     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 He is married with no children. He does not routinely exercise. There is no tobacco or alcohol use. 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  ROS General: Negative; No fevers, chills, or night sweats;  HEENT: Negative; No changes in vision or hearing, sinus congestion, difficulty swallowing Pulmonary: Negative; No cough, wheezing, shortness of breath, hemoptysis Cardiovascular: See history of present  illness GI: Negative; No nausea, vomiting, diarrhea, or abdominal pain GU: Positive for history of BPH; No dysuria, hematuria, or difficulty voiding Musculoskeletal: Positive for bilateral hip bursitis and arthritis Hematologic/Oncology: Positive for easy bruisability secondary to antiplatelet therapy Endocrine: Positive for type 2 diabetes mellitus; no heat/cold intolerance;  Neuro: Negative; no changes in balance, headaches Skin: Negative; No rashes or skin lesions Psychiatric: Negative; No behavioral problems, depression Sleep: Negative; No snoring, daytime sleepiness, hypersomnolence, bruxism, restless legs, hypnogognic hallucinations, no cataplexy Other comprehensive 14 point  system review is negative.   PE BP (!) 146/74   Pulse 73   Resp 16   Ht _0  (1.803 m)   Wt 156 lb 6.4 oz (70.9 kg)   SpO2 99%   BMI 21.81 kg/m    Repeat blood pressure by me was 144/76.  Wt Readings from Last 3 Encounters:  09/07/17 156 lb 6.4 oz (70.9 kg)  08/27/16 155 lb 3.2 oz (70.4 kg)  02/15/16 150 lb (68 kg)   General: Alert, oriented, no distress.  Skin: normal turgor, no rashes, warm and dry HEENT: Normocephalic, atraumatic. Pupils equal round and reactive to light; sclera anicteric; extraocular muscles intact;  Nose without nasal septal hypertrophy Mouth/Parynx benign; Mallinpatti scale 3 Neck: No JVD, no carotid bruits; normal carotid upstroke Lungs: clear to ausculatation and percussion; no wheezing or rales Chest wall: without tenderness to palpitation Heart: PMI not displaced, RRR, s1 s2 normal, 2/6 systolic murmur, no diastolic murmur, no rubs, gallops, thrills, or heaves Abdomen: Diastases recti soft, nontender; no hepatosplenomehaly, BS+; abdominal aorta nontender and not dilated by palpation. Back: no CVA tenderness; soft, nontender lipoma in the back Pulses 2+ Musculoskeletal: full range of motion, normal strength, no joint deformities Extremities: no clubbing cyanosis or  edema, Homan's sign negative  Neurologic: grossly nonfocal; Cranial nerves grossly wnl Psychologic: Normal mood and affect  ECG (independently read by me): Normal sinus rhythm at 68 bpm; first-degree AV block with a PR interval of 250 ms.  No ectopy.  Nonspecific ST changes.  January 2018 ECG (independently read by me): Normal sinus rhythm at 70 bpm with first-degree AV block with a PR interval at 270 ms.  Nonspecific T changes  October 2016 ECG (independently read by me): Normal sinus rhythm with mild sinus arrhythmia at 67 bpm.  First-degree AV block with a PR interval at 238 ms.  December 2015 ECG (independently read by me): Sinus rhythm with first-degree AV block with a PR interval at 244 ms.  No significant ST segment changes.  Prior June 2015 ECG: Normal sinus rhythm at 63 beats per minute.  First degree AV block with PR interval at 236 ms.  No significant ST changes.  LABS: I reviewed laboratory from 07/02/2017.  Hemoglobin 14.1, hematocrit 42.6.  Glucose 142, BUN 19, creatinine 0.96.  Normal LFTs.  Hemoglobin A1c 7.6.  PSA 2.6. Total cholesterol 156, TG 54, HDL 79, VLDL 11, LDL 66.  I reviewed recent blood work from 12/17/2015 done in [place], [place].   Glucose was 138.  BUN 17, creatinine 0.91.  LFTs normal. Lipid studies revealed cholesterol 150, HDL 66, triglycerides 78, LDL 68,  HbA1c 7.0  November 2017.  Glucose was increased at 146.  BUN was 20 and creatinine 0.96.  Liver function studies were normal.  TSH was normal at 1.45.  CBC was normal.  Lipid studies revealed a total cholesterol 159, triglycerides 65, HDL 73, and LDL 73.  Hemoglobin A1c was minimally increased at 6.6.  PSA was normal.  BMP Latest Ref Rng & Units 07/31/2014 06/26/2012 06/25/2012  Glucose 70 - 99 mg/dL 98 132(H) 176(H)  BUN 6 - 23 mg/dL _1 Creatinine 0.50 - 1.35 mg/dL 0.90 0.79 0.80  Sodium 135 - 145 mEq/L 143 140 139  Potassium 3.5 - 5.3 mEq/L 4.9 4.0 4.6  Chloride 96 - 112 mEq/L 104  106 103  CO2 19 - 32 mEq/L _2 Calcium 8.4 - 10.5 mg/dL 10.2 9.3 9.9   Hepatic Function Latest  Ref Rng & Units 07/31/2014 06/21/2012  Total Protein 6.0 - 8.3 g/dL 7.0 6.7  Albumin 3.5 - 5.2 g/dL 4.7 3.5  AST 0 - 37 U/L 18 34  ALT 0 - 53 U/L 20 27  Alk Phosphatase 39 - 117 U/L 50 65  Total Bilirubin 0.2 - 1.2 mg/dL 0.5 0.4  Bilirubin, Direct 0.0 - 0.3 mg/dL - <0.1   CBC Latest Ref Rng & Units 07/31/2014 06/26/2012 06/25/2012  WBC 4.0 - 10.5 K/uL 8.0 7.7 7.5  Hemoglobin 13.0 - 17.0 g/dL 14.7 13.4 13.9  Hematocrit 39.0 - 52.0 % 43.5 38.7(L) 40.5  Platelets 150 - 400 K/uL 224 187 205   Lab Results  Component Value Date   MCV 86.3 07/31/2014   MCV 86.0 06/26/2012   MCV 87.1 06/25/2012   Lab Results  Component Value Date   TSH 1.168 07/31/2014   Lab Results  Component Value Date   HGBA1C 6.8 (H) 06/18/2012   Lipid Panel     Component Value Date/Time   CHOL 187 07/31/2014 1224   TRIG 199 (H) 07/31/2014 1224   HDL 59 07/31/2014 1224   CHOLHDL 3.2 07/31/2014 1224   VLDL 40 07/31/2014 1224   LDLCALC 88 07/31/2014 1224    RADIOLOGY: No results found.  IMPRESSION:  1. Coronary artery disease due to lipid rich plaque   2. Essential hypertension   3. Systolic murmur of aorta   4. Mixed hyperlipidemia   5. Type 2 diabetes mellitus with other circulatory complication, without long-term current use of insulin ([place])   6. Gastroesophageal reflux disease without esophagitis   7. Statin intolerance   8. Lipoma of torso     ASSESSMENT AND PLAN: Daryl. Guevara is a young appearing 84 year old  white male who is status post CABG revascularization surgery in 1999. In  November 2013 he suffered a non-ST segment elevation myocardial infarction secondary to a high-grade stenosis in the graft supplying his diagonal vessel which was successfully intervened on by me.  He was transitioned off Brilinta to Plavix and had verification of Plavix sensitivity by P2Y12 testing.  In December 2015  repeat catheterization showed severe native CAD with patent grafts as noted above.  He  had complete resolution of symptomatology with his increased nitrates and beta blocker therapy.  In the past, he developed marked LFT elevation with Mevacor in the 1980s and almost underwent liver biopsy.  He is not on statin since that time.  He has been maintained on amlodipine 10 mg, but has a pro-40 mg, Toprol-XL 75 mg daily for hypertension.  He has mild hypertension with systolic blood pressures but seem to be consistently in the low to mid 140s.  Upon further questioning, he has been eating a fair amount of [place] food and also has been using canned vegetables vegetables and soups which may be contributing to increased sodium intake, although he has not been getting sodium to his food.  He has first-degree AV block and I do not feel I wanted further titrate his beta blocker regimen.  There are no signs of edema.  He will make modification in his diet to see if his systolic blood pressure can slightly decreased.  He has recently undergone steroid injections which also may have contributed to his mild blood pressure elevation as well as increased glucose and hemoglobin A1c.  He is diabetic on metformin.  He is not having recurrent anginal symptoms.  He continues to be on dual antiplatelet therapy with aspirin and Plavix and is  without bleeding.  His GERD is controlled on Protonix.  Studies are very good onset.  He and fenofibrate.  He recently was also started on Flomax by his primary physician.  His long as he remains stable, I will see him in one year for reevaluation.   Time spent: 25 minutes  Troy Sine, MD, [facility]  09/07/2017 10:09 AM

## 2017-09-17 ENCOUNTER — Other Ambulatory Visit: Payer: Self-pay | Admitting: Cardiovascular Disease

## 2017-10-24 ENCOUNTER — Other Ambulatory Visit: Payer: Self-pay | Admitting: Cardiovascular Disease

## 2017-11-02 ENCOUNTER — Other Ambulatory Visit: Payer: Self-pay | Admitting: Cardiovascular Disease

## 2017-11-10 ENCOUNTER — Other Ambulatory Visit: Payer: Self-pay | Admitting: Cardiovascular Disease

## 2017-11-10 NOTE — Telephone Encounter (Signed)
REFILL 

## 2017-11-17 ENCOUNTER — Other Ambulatory Visit: Payer: Self-pay | Admitting: Cardiovascular Disease

## 2017-11-27 ENCOUNTER — Telehealth: Payer: Self-pay

## 2017-11-27 NOTE — Telephone Encounter (Signed)
Patient of Dr. Claiborne Billings, past PCI was in 2013, last seen by Dr. Claiborne Billings 09/07/2017. He is quite active and denies any recent CP or SOB. From cardiac perspective, he should be cleared. Per our protocol, Dr. Claiborne Billings will have to approve how long to hold aspirin and plavix  Dr. Claiborne Billings, are you ok with patient hold aspirin and plavix for 7 days prior to back injection? Please send your response to P [facility] Preop  Signed, Almyra Deforest PA Pager: [phone]

## 2017-11-27 NOTE — Telephone Encounter (Signed)
   [facility] Pre-operative Risk Assessment    Request for surgical clearance:  1. What type of surgery is being performed? ESI Lumbar  2. When is this surgery scheduled? TBD  3. What type of clearance is required (medical clearance vs. Pharmacy clearance to hold med vs. Both)? Pharmacy  4. Are there any medications that need to be held prior to surgery and how long? ASA, Plavix  6-7 days prior  5. Practice name and name of physician performing surgery? [facility] ( Dr. Marlaine Hind)  6. What is your office phone and fax number? [phone] Fax: [phone]  7. Anesthesia type (None, local, MAC, general) ? Unkown   Daryl Joseph 11/27/2017, 1:14 PM  _________________________________________________________________   (provider comments below)

## 2017-12-01 NOTE — Telephone Encounter (Signed)
OK to hold aspirin and Plavix for procedure

## 2017-12-01 NOTE — Telephone Encounter (Signed)
   Primary Cardiologist:Thomas Claiborne Billings, MD  Waiting on input from Dr. Shelva Majestic regarding ASA and Plavix.   Richardson Dopp, PA-C  12/01/2017, 4:07 PM

## 2018-01-04 ENCOUNTER — Other Ambulatory Visit: Payer: Self-pay | Admitting: Cardiovascular Disease

## 2018-04-27 ENCOUNTER — Other Ambulatory Visit: Payer: Self-pay | Admitting: Cardiovascular Disease

## 2018-05-24 ENCOUNTER — Other Ambulatory Visit: Payer: Self-pay | Admitting: Cardiovascular Disease

## 2018-09-22 ENCOUNTER — Other Ambulatory Visit: Payer: Self-pay | Admitting: Cardiovascular Disease

## 2018-09-22 NOTE — Telephone Encounter (Signed)
Rx(s) sent to pharmacy electronically.  

## 2018-10-20 ENCOUNTER — Other Ambulatory Visit: Payer: Self-pay | Admitting: Cardiovascular Disease

## 2018-10-26 ENCOUNTER — Ambulatory Visit: Payer: Medicare Other | Admitting: Cardiovascular Disease

## 2018-11-22 ENCOUNTER — Other Ambulatory Visit: Payer: Self-pay | Admitting: Cardiovascular Disease

## 2018-11-23 NOTE — Telephone Encounter (Signed)
Fenofibrate refilled.

## 2019-01-17 ENCOUNTER — Other Ambulatory Visit: Payer: Self-pay | Admitting: Cardiovascular Disease

## 2019-01-20 ENCOUNTER — Telehealth: Payer: Medicare Other | Admitting: Cardiovascular Disease

## 2019-01-27 ENCOUNTER — Telehealth: Payer: Medicare Other | Admitting: Physician Assistant

## 2019-02-15 ENCOUNTER — Other Ambulatory Visit: Payer: Self-pay | Admitting: Cardiovascular Disease

## 2019-03-09 ENCOUNTER — Other Ambulatory Visit: Payer: Self-pay | Admitting: Cardiovascular Disease

## 2019-03-30 ENCOUNTER — Other Ambulatory Visit: Payer: Self-pay | Admitting: Cardiovascular Disease

## 2019-04-04 ENCOUNTER — Ambulatory Visit (INDEPENDENT_AMBULATORY_CARE_PROVIDER_SITE_OTHER): Payer: Medicare Other | Admitting: Cardiovascular Disease

## 2019-04-04 ENCOUNTER — Telehealth: Payer: Self-pay | Admitting: Cardiovascular Disease

## 2019-04-04 ENCOUNTER — Encounter: Payer: Self-pay | Admitting: Cardiovascular Disease

## 2019-04-04 ENCOUNTER — Other Ambulatory Visit: Payer: Self-pay

## 2019-04-04 DIAGNOSIS — E1159 Type 2 diabetes mellitus with other circulatory complications: Secondary | ICD-10-CM

## 2019-04-04 DIAGNOSIS — R011 Cardiac murmur, unspecified: Secondary | ICD-10-CM

## 2019-04-04 DIAGNOSIS — E782 Mixed hyperlipidemia: Secondary | ICD-10-CM

## 2019-04-04 DIAGNOSIS — I2510 Atherosclerotic heart disease of native coronary artery without angina pectoris: Secondary | ICD-10-CM

## 2019-04-04 DIAGNOSIS — I10 Essential (primary) hypertension: Secondary | ICD-10-CM

## 2019-04-04 DIAGNOSIS — Z951 Presence of aortocoronary bypass graft: Secondary | ICD-10-CM

## 2019-04-04 DIAGNOSIS — D171 Benign lipomatous neoplasm of skin and subcutaneous tissue of trunk: Secondary | ICD-10-CM

## 2019-04-04 MED ORDER — HYDROCHLOROTHIAZIDE 12.5 MG PO CAPS: 12.5000 mg | ORAL_CAPSULE | ORAL | 3 refills | Status: DC | PRN

## 2019-04-04 NOTE — Telephone Encounter (Signed)
Spoke with pharmacy and clarified that HCTZ 12.5 mg is once daily as needed for leg swelling, no further questions.

## 2019-04-04 NOTE — Progress Notes (Signed)
Patient ID: Daryl Joseph, male   DOB: 08-12-1932, 85 y.o.   MRN: [id number]    HPI: Daryl Joseph is a 85 y.o. male who presents to the office today for an 61 he did okay to stop month cardiology evaluation.  [name] has established CAD and in 1999 underwent CABG surgery with a LIMA to the LAD, vein graft to diagonal, sequential vein graft to the obtuse marginal distal circumflex coronary artery and vein graft to the RCA. 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 In November 2013 he suffered a non-ST segment elevation myocardial infarction and underwent initial catheterization by [name]. I ultimately performed intervention a high-grade ulcerated plaque graft stenosis supplying a diagonal vessel on 06/25/2012 and a 3.5x33 mm  Xience Xpedition DES stent was inserted and post dilated 3.9 mm. Once his graft was opened, the proximal LAD was now significantly improved with reference to being filled from the diagonal vessel.    On a one year followup nuclear perfusion study he had held his metoprolol, amlodipine and isosorbide for the stress test. He did not develop any chest pain. However, he did have an exaggerated hypertensive blood pressure response to exercise and developed 3 mm ST segment depression. Scintigraphic images were low risk and specifically he had normal flow in the diagonal vessel territory.   In December 2014, he was concerned about the price of Brilinta.  Since his been 13 months since his ACS intervention, I switched him to Plavix and P2Y12 testing verifiend plavix sensitivity.   He has been on fenofibrate and Zetia for hyperlipidemia in light of his statin hypersensitivity.  Laboratory in March 2015 revealed a cholesterol was 177, triglycerides 68, HDL 68, LDL 95.  Hemoglobin A1c was 7.0.  He went 21, creatinine 0.88.  Normal LFTs.  He brought with him.  Laboratory which was done in  [place], [place] on 04/18/2014.  I reviewed this in detail with him.  He had normal thyroid function studies.  Hemoglobin 14.8, hematocrit 44.4.  Glucose was 142.  LDL cholesterol was 81 with total cholesterol 165, HDL 67, triglycerides 83.  Hemoglobin A1c was increased at 7.4.  PSA was 2.32.  When I saw him in December 2015 he complained of developing similar chest tightness with walking over several weeks, which was somewhat nitrate responsive.  He underwent repeat cardiac catheterization on 08/01/2014 which showedLow normal LV function with an ejection fraction of 50-55% and mild distal inferior hypocontractility. There was severe native CAD with 80% distal left main stenosis and total occlusion of the LAD in its ostium, 95% stenosis in the OM 2 vessel the circumflex with 30% narrowing in the OM1 branch proximal to the graft anastomoses, and total occlusion of the mid RCA. He had a patent LIMA graft supplying the mid LAD with 30-40% smooth mid LAD stenosis beyond the anastomosis., a widely patent stent in the mid distal aspect of the SVG supplying the diagonal vessel without evidence for restenosis., a patent sequential vein graft supplying the OM1 and OM 2 vessel with smooth 20% narrowing in the proximal portion of the sequential limb just beyond the OM1 anastomosis and a patent SVG supplying a dominant RCA with 80% mid PDA stenosis and a small PDA caliber vessel.  Increased medical therapy was recommended with titration of his nitrates and beta blocker and possible initiation of Ranexa.  Since I last saw him, [name] has felt well  without recurrent anginal symptomatology or significant shortness of breath on his increased medical regimen.  He denies recurrent anginal symptoms.  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  He has GERD and his Prilosec was changed to Protonix due to potential Plavix interaction.  He denies any presyncope or syncope, PND, orthopnea.     An echo Doppler study in August 2017 to further evaluate his cardiac murmur demonstrated normal systolic function with grade 1 diastolic dysfunction.  There was mild aortic sclerosis.  Mean gradient 5 peak gradient 11.  There was mild MR.  Had normal pulmonic pressures.  There was mild TR.    He underwent laboratory by his primary physician in November 2017.  Glucose was increased at 146.  BUN was 20 and creatinine 0.96.  Liver function studies were normal.  TSH was normal at 1.45.  CBC was normal.  Lipid studies revealed a total cholesterol 159, triglycerides 65, HDL 73, and LDL 73.  Hemoglobin A1c was minimally increased at 6.6.  PSA was normal.    I last saw him in February 2019.  At that time, he was having issues with bursitis and arthritis of his hip and has undergone 3 steroid injections.   Laboratory in November 2018 was done at the time he was having steroid injections which may have contributed to his glucose being elevated at 142, and hemoglobin A1c increased at 7.6.  He has continued to be on Zetia for hyperlipidemia, and remotely had LFT elevation with Mevacor.  Lipid studies were very good on Zetia with a total cholesterol 156, triglycerides 54, HDL 79, and LDL 66.  He normal LFTs and renal function.    His follow-up appointment has been rescheduled due to the [place] pandemic.  Over the past 18 months he continues to do well.  He remains active on his farm.  He denies any recurrent anginal symptomatology.  He sees Salley Slaughter  for primary care at [facility] in [place].  He had laboratory on January 21, 2019 which showed a total cholesterol 158, HDL 61, LDL 79.  Glucose was 134.  He had normal renal function with a creatinine of 0.98.  LFTs were normal.  Hemoglobin A1c was 7.0.  Presently, he has been on amlodipine 10 mg, benazepril 40 mg, isosorbide 60 mg, and Toprol-XL 75 mg.  He states his blood pressure at home typically is in the 130s.  When his laboratory was checked, blood  pressure was 138/63 in June at his primary care office.  He continues to be on Zetia and fenofibrate for hyperlipidemia.  He is on metformin 500 mg twice a day for diabetes mellitus.  He is on pantoprazole for GERD.  Presently he feels well.  He denies any chest pain PND orthopnea.  At times he has noticed some intermittent ankle swelling. He tries to watch his sodium intake but at times he does get some excess sodium in certain foods.  He presents for evaluation.  Past Medical History:  Diagnosis Date  . Anginal pain ([place])   . Arthritis    "mostly in my hands" (06/25/2012)  . CAD (coronary artery disease) 03/25/2007   30 d monitor - sinus rhythm w/ some PACs  . DM type 2 (diabetes mellitus, type 2) ([place])   . GERD (gastroesophageal reflux disease)   . H/O hiatal hernia   . Hyperlipidemia   . Hypertension   . NSTEMI (non-ST elevated myocardial infarction) ([place]) 06/18/2012  .  S/P angioplasty with stent, to VG -diag 1, LAD and septal perferator vessels  06/25/2012    Past Surgical History:  Procedure Laterality Date  . CARDIAC CATHETERIZATION  06/25/2012   3.5 x 33m Xience Xpedition DES inserted in distal third of LAD  . CARDIOVASCULAR STRESS TEST  11/05/2010   R/S MV - EF 73%; post stress LV fcn normal, no significant wall motion abnormalities; perfusion defect in inferior myocardial region consistent w/ diaphragmatic attenuation, remaining myocardium demonstrates normal perfusion; Exercise capacity 10 METS; stress EKG showed changes from baseline EKG; study results unchanged/within normal variance of last study (09/2008)  . CERVICAL DISC SURGERY  1990's  . CORONARY ANGIOPLASTY WITH STENT PLACEMENT  06/25/2012   "1; first one for me" (06/25/2012)  . CORONARY ARTERY BYPASS GRAFT  10/1997   CABG X 4 LIMA to LAD; vein to diagonal; sequential vein to obtuse marginal and distal circumflex and vein to RCA  . DOPPLER ECHOCARDIOGRAPHY  09/23/2011   EF >55%; proximal septal thickening noted; mild  septal hypokinesis; ; mild/mod tricuspid regurgitation; aortic root sclerosis/calcification  . LEFT HEART CATHETERIZATION WITH CORONARY/GRAFT ANGIOGRAM N/A 06/21/2012   Procedure: LEFT HEART CATHETERIZATION WITH CBeatrix Fetters  Surgeon: MSanda Klein MD;  [facility];  Service: Cardiovascular;  Laterality: N/A;  . LEFT HEART CATHETERIZATION WITH CORONARY/GRAFT ANGIOGRAM N/A 08/01/2014   Procedure: LEFT HEART CATHETERIZATION WITH CBeatrix Fetters  Surgeon: TTroy Sine MD;  [facility];  Service: Cardiovascular;  Laterality: N/A;  . PERCUTANEOUS CORONARY STENT INTERVENTION (PCI-S) N/A 06/25/2012   Procedure: PERCUTANEOUS CORONARY STENT INTERVENTION (PCI-S);  Surgeon: TTroy Sine MD;  [facility];  Service: Cardiovascular;  Laterality: N/A;  . TONSILLECTOMY AND ADENOIDECTOMY     "when I was a kid" (06/25/2012)    Allergies  Allergen Reactions  . Statins Other (See Comments)    Myositis with statins "affected my liver; I almost had Hepatitis" (06/25/2012)  . Welchol [Colesevelam]     Current Outpatient Medications  Medication Sig Dispense Refill  . amLODipine (NORVASC) 10 MG tablet TAKE 1 TABLET BY MOUTH  DAILY 90 tablet 3  . aspirin 81 MG tablet Take 81 mg by mouth daily.    . benazepril (LOTENSIN) 40 MG tablet TAKE 1 TABLET BY MOUTH  DAILY 90 tablet 3  . clopidogrel (PLAVIX) 75 MG tablet TAKE 1 TABLET BY MOUTH  DAILY 90 tablet 3  . ezetimibe (ZETIA) 10 MG tablet Take 1 tablet (10 mg total) by mouth at bedtime. **NEEDS OFFICE VISIT** 30 tablet 0  . fenofibrate 160 MG tablet TAKE 1 TABLET BY MOUTH  DAILY 90 tablet 0  . folic acid (FOLVITE) 1 MG tablet Take one tablet by mouth daily 90 tablet 1  . isosorbide mononitrate (IMDUR) 60 MG 24 hr tablet TAKE 1 TABLET BY MOUTH  DAILY 90 tablet 3  . ketorolac (TORADOL) 10 MG tablet Take 10 mg by mouth every 6 (six) hours as needed.    . metFORMIN (GLUCOPHAGE) 500 MG tablet Take 500 mg by  mouth 2 (two) times daily with a meal.     . metoprolol succinate (TOPROL-XL) 50 MG 24 hr tablet TAKE 1 AND 1/2 TABLETS BY  MOUTH ONCE A DAY. TAKE WITH OR IMMEDIATELY FOLLOWING A  MEAL. 135 tablet 0  . Multiple Vitamins-Minerals (EYE VITAMINS PO) Take 1 tablet by mouth every evening.    . nitroGLYCERIN (NITROSTAT) 0.4 MG SL tablet PLACE 1 TABLET UNDER THE TONGUE EVERY 5 MINUTES AS NEEDED FOR CHEST  PAIN 25 tablet 0  . pantoprazole (PROTONIX) 40 MG tablet     . hydrochlorothiazide (MICROZIDE) 12.5 MG capsule Take 1 capsule (12.5 mg total) by mouth as needed (swelling). 90 capsule 3   No current facility-administered medications for this visit.     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 He is married with no children. He does not routinely exercise. There is no tobacco or alcohol use. 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  ROS General: Negative; No fevers, chills, or night sweats;  HEENT: Negative; No changes in vision or hearing, sinus congestion, difficulty swallowing Pulmonary: Negative; No cough, wheezing, shortness of breath, hemoptysis Cardiovascular: See history of present illness GI: Negative; No nausea, vomiting, diarrhea, or abdominal pain GU: Positive for history of BPH; No dysuria, hematuria, or difficulty voiding Musculoskeletal: Positive for bilateral hip bursitis and arthritis Hematologic/Oncology: Positive for easy bruisability secondary to antiplatelet therapy Endocrine: Positive for type 2 diabetes mellitus; no heat/cold intolerance;  Neuro: Negative; no changes in balance, headaches Skin: Negative; No rashes or skin lesions Psychiatric: Negative; No behavioral problems, depression Sleep: Negative; No snoring, daytime sleepiness, hypersomnolence, bruxism, restless legs, hypnogognic hallucinations, no cataplexy Other comprehensive 14 point system review is negative.   PE BP (!) 155/73   Pulse 66   Ht _0  (1.803 m)   Wt 154 lb 6.4  oz (70 kg)   BMI 21.53 kg/m    Repeat blood pressure by me was 150/74  Wt Readings from Last 3 Encounters:  04/04/19 154 lb 6.4 oz (70 kg)  09/07/17 156 lb 6.4 oz (70.9 kg)  08/27/16 155 lb 3.2 oz (70.4 kg)    General: Alert, oriented, no distress.  Skin: normal turgor, no rashes, warm and dry HEENT: Normocephalic, atraumatic. Pupils equal round and reactive to light; sclera anicteric; extraocular muscles intact; Nose without nasal septal hypertrophy Mouth/Parynx benign; Mallinpatti scale 3Neck: No JVD, no carotid bruits; normal carotid upstroke Lungs: clear to ausculatation and percussion; no wheezing or rales Chest wall: without tenderness to palpitation Heart: PMI not displaced, RRR, s1 s2 normal, 8-6/7 systolic murmur, no diastolic murmur, no rubs, gallops, thrills, or heaves Abdomen: soft, nontender; no hepatosplenomehaly, BS+; abdominal aorta nontender and not dilated by palpation. Back: no CVA tenderness; soft lipoma in his mid back Pulses 2+ Musculoskeletal: full range of motion, normal strength, no joint deformities Extremities: no clubbing cyanosis or edema, Homan's sign negative  Neurologic: grossly nonfocal; Cranial nerves grossly wnl Psychologic: Normal mood and affect    ECG (independently read by me):  February 2019 ECG (independently read by me): Normal sinus rhythm at 68 bpm; first-degree AV block with a PR interval of 250 ms.  No ectopy.  Nonspecific ST changes.  January 2018 ECG (independently read by me): Normal sinus rhythm at 70 bpm with first-degree AV block with a PR interval at 270 ms.  Nonspecific T changes  October 2016 ECG (independently read by me): Normal sinus rhythm with mild sinus arrhythmia at 67 bpm.  First-degree AV block with a PR interval at 238 ms.  December 2015 ECG (independently read by me): Sinus rhythm with first-degree AV block with a PR interval at 244 ms.  No significant ST segment changes.  Prior June 2015 ECG: Normal sinus  rhythm at 63 beats per minute.  First degree AV block with PR interval at 236 ms.  No significant ST changes.  LABS: I reviewed laboratory from 07/02/2017.  Hemoglobin 14.1, hematocrit 42.6.  Glucose 142, BUN 19, creatinine 0.96.  Normal LFTs.  Hemoglobin A1c 7.6.  PSA 2.6. Total cholesterol 156, TG 54, HDL 79, VLDL 11, LDL 66.  I reviewed recent blood work from 12/17/2015 done in [place], [place].   Glucose was 138.  BUN 17, creatinine 0.91.  LFTs normal. Lipid studies revealed cholesterol 150, HDL 66, triglycerides 78, LDL 68,  HbA1c 7.0  November 2017.  Glucose was increased at 146.  BUN was 20 and creatinine 0.96.  Liver function studies were normal.  TSH was normal at 1.45.  CBC was normal.  Lipid studies revealed a total cholesterol 159, triglycerides 65, HDL 73, and LDL 73.  Hemoglobin A1c was minimally increased at 6.6.  PSA was normal.  BMP Latest Ref Rng & Units 07/31/2014 06/26/2012 06/25/2012  Glucose 70 - 99 mg/dL 98 132(H) 176(H)  BUN 6 - 23 mg/dL _0 Creatinine 0.50 - 1.35 mg/dL 0.90 0.79 0.80  Sodium 135 - 145 mEq/L 143 140 139  Potassium 3.5 - 5.3 mEq/L 4.9 4.0 4.6  Chloride 96 - 112 mEq/L 104 106 103  CO2 19 - 32 mEq/L _1 Calcium 8.4 - 10.5 mg/dL 10.2 9.3 9.9   Hepatic Function Latest Ref Rng & Units 07/31/2014 06/21/2012  Total Protein 6.0 - 8.3 g/dL 7.0 6.7  Albumin 3.5 - 5.2 g/dL 4.7 3.5  AST 0 - 37 U/L 18 34  ALT 0 - 53 U/L 20 27  Alk Phosphatase 39 - 117 U/L 50 65  Total Bilirubin 0.2 - 1.2 mg/dL 0.5 0.4  Bilirubin, Direct 0.0 - 0.3 mg/dL - <0.1   CBC Latest Ref Rng & Units 07/31/2014 06/26/2012 06/25/2012  WBC 4.0 - 10.5 K/uL 8.0 7.7 7.5  Hemoglobin 13.0 - 17.0 g/dL 14.7 13.4 13.9  Hematocrit 39.0 - 52.0 % 43.5 38.7(L) 40.5  Platelets 150 - 400 K/uL 224 187 205   Lab Results  Component Value Date   MCV 86.3 07/31/2014   MCV 86.0 06/26/2012   MCV 87.1 06/25/2012   Lab Results  Component Value Date   TSH 1.168 07/31/2014   Lab  Results  Component Value Date   HGBA1C 6.8 (H) 06/18/2012   Lipid Panel     Component Value Date/Time   CHOL 187 07/31/2014 1224   TRIG 199 (H) 07/31/2014 1224   HDL 59 07/31/2014 1224   CHOLHDL 3.2 07/31/2014 1224   VLDL 40 07/31/2014 1224   LDLCALC 88 07/31/2014 1224    RADIOLOGY: No results found.  IMPRESSION:  1. Essential hypertension   2. Coronary artery disease involving native coronary artery of native heart without angina pectoris   3. Hx of CABG   4. Mixed hyperlipidemia   5. Murmur, cardiac   6. Type 2 diabetes mellitus with other circulatory complication, without long-term current use of insulin ([place])   7. Lipoma of torso     ASSESSMENT AND PLAN: Mr. Nelis is a young appearing 85 year-old  white male who is status post CABG revascularization surgery in 1999. In  November 2013 he suffered a non-ST segment elevation myocardial infarction secondary to a high-grade stenosis in the graft supplying his diagonal vessel which was successfully intervened on by me.  He was transitioned off Brilinta to Plavix and had verification of Plavix sensitivity by P2Y12 testing.  In December 2015 repeat catheterization showed severe native CAD with patent grafts as noted above.  He  had complete resolution of symptomatology with his increased nitrates and beta blocker  therapy.  In the past, he developed marked LFT elevation with Mevacor in the 1980s and almost underwent liver biopsy.  He is not on statin since that time.  In 2017 he had undergone an echo Doppler study for evaluation of a cardiac murmur.  He had normal LV function with grade 1 diastolic dysfunction.  There was mild MR and normal PA pressures.  There was no evidence for aortic stenosis.  Presently, he has been without anginal symptomatology.  He states his blood pressure at home typically is in the 951O systolically.  His blood pressure today is elevated on his medical regimen of amlodipine 10 mg, benazepril 40 mg, isosorbide 60 mg,  and Toprol-XL 75 mg.  Resting pulse is in the 66 range and there is mild first-degree AV block.  He has noted some intermittent leg swelling and I have given him a prescription for HCTZ 12.5 mg to take on an as-needed basis for swelling.  He will also monitor his blood pressure.  Target blood pressure is less than 130/80 with ideal blood pressure less than 120/80.  He will monitor his blood pressure and if he notices his blood pressure consistently in the upper 130s or 140s he may need to institute HCTZ every other day or daily as needed.  I reviewed his laboratory.  He is tolerating Zetia and fenofibrate.  His hemoglobin A1c is 7.0 on his current dose of metformin.  I will see him in 6 months for follow-up evaluation.  At that time I have recommended he undergo a 4-year follow-up echo Doppler study and will see him in the office for follow-up evaluation.  Time spent: 25 minutes  Troy Sine, MD, [facility]  04/04/2019 9:16 AM

## 2019-04-04 NOTE — Addendum Note (Signed)
Addended by: Caprice Beaver T on: 04/04/2019 10:29 AM   Modules accepted: Orders

## 2019-04-04 NOTE — Telephone Encounter (Signed)
New Message:     She needs clarification on directions for pt's Hydrochlorothiazide.

## 2019-04-04 NOTE — Patient Instructions (Addendum)
Medication Instructions:  Start HCTZ 12.5 mg as needed for swelling and if blood pressure is above 140.  If you need a refill on your cardiac medications before your next appointment, please call your pharmacy.   Testing/Procedures: Echocardiogram (6 months) - Your physician has requested that you have an echocardiogram. Echocardiography is a painless test that uses sound waves to create images of your heart. It provides your doctor with information about the size and shape of your heart and how well your heart's chambers and valves are working. This procedure takes approximately one hour. There are no restrictions for this procedure. This will be performed at our [facility] location - [address].   Follow-Up: At [facility], you and your health needs are our priority.  As part of our continuing mission to provide you with exceptional heart care, we have created designated Provider Care Teams.  These Care Teams include your primary Cardiologist (physician) and Advanced Practice Providers (APPs -  Physician Assistants and Nurse Practitioners) who all work together to provide you with the care you need, when you need it. You will need a follow up appointment in 6 months.  Please call our office 2 months in advance to schedule this appointment.  You may see Shelva Majestic, MD or one of the following Advanced Practice Providers on your designated Care Team: [place], [place] . Fabian Sharp, PA-C

## 2019-04-18 ENCOUNTER — Other Ambulatory Visit: Payer: Self-pay

## 2019-04-18 MED ORDER — EZETIMIBE 10 MG PO TABS: 10.0000 mg | ORAL_TABLET | Freq: Every day | ORAL | 2 refills | Status: DC

## 2019-05-12 ENCOUNTER — Other Ambulatory Visit: Payer: Self-pay | Admitting: Cardiovascular Disease

## 2019-05-29 ENCOUNTER — Other Ambulatory Visit: Payer: Self-pay | Admitting: Cardiovascular Disease

## 2019-09-08 ENCOUNTER — Other Ambulatory Visit: Payer: Self-pay | Admitting: Cardiovascular Disease

## 2019-09-26 ENCOUNTER — Other Ambulatory Visit: Payer: Self-pay

## 2019-09-26 ENCOUNTER — Telehealth: Payer: Self-pay | Admitting: Cardiovascular Disease

## 2019-09-26 ENCOUNTER — Ambulatory Visit (HOSPITAL_COMMUNITY): Payer: Medicare Other | Attending: Cardiovascular Disease

## 2019-09-26 DIAGNOSIS — R011 Cardiac murmur, unspecified: Secondary | ICD-10-CM

## 2019-09-26 NOTE — Telephone Encounter (Signed)
Noted  

## 2019-09-26 NOTE — Telephone Encounter (Signed)
Patient had an echo today and would like a call to discuss results when they become available.

## 2019-10-06 ENCOUNTER — Telehealth: Payer: Self-pay | Admitting: Cardiovascular Disease

## 2019-10-06 NOTE — Telephone Encounter (Signed)
    Pt's wife returning call, she said they received a call from Dr. Claiborne Billings nurse today, she said it might have related to pt's echo results.  Please call

## 2019-10-07 NOTE — Telephone Encounter (Signed)
Spoke with pt. He states he was already called with the results from his echo a few days ago, asked if this was for something different. Told patient that I was sorry for the multiple calls and reviewed his echo results with him again. Notified pt to keep his follow up appt with Dr.Kelly 12/06/19. No other questions or concerns at this time.

## 2019-10-17 ENCOUNTER — Other Ambulatory Visit: Payer: Self-pay | Admitting: Cardiovascular Disease

## 2019-12-06 ENCOUNTER — Ambulatory Visit: Payer: Medicare Other | Admitting: Cardiovascular Disease

## 2020-01-27 ENCOUNTER — Other Ambulatory Visit: Payer: Self-pay | Admitting: Cardiovascular Disease

## 2020-02-11 ENCOUNTER — Other Ambulatory Visit: Payer: Self-pay | Admitting: Cardiovascular Disease

## 2020-03-01 ENCOUNTER — Other Ambulatory Visit: Payer: Self-pay

## 2020-03-01 ENCOUNTER — Ambulatory Visit (INDEPENDENT_AMBULATORY_CARE_PROVIDER_SITE_OTHER): Payer: Medicare Other | Admitting: Cardiovascular Disease

## 2020-03-01 ENCOUNTER — Encounter: Payer: Self-pay | Admitting: Cardiovascular Disease

## 2020-03-01 DIAGNOSIS — E1159 Type 2 diabetes mellitus with other circulatory complications: Secondary | ICD-10-CM

## 2020-03-01 DIAGNOSIS — E782 Mixed hyperlipidemia: Secondary | ICD-10-CM | POA: Diagnosis not present

## 2020-03-01 DIAGNOSIS — I10 Essential (primary) hypertension: Secondary | ICD-10-CM

## 2020-03-01 DIAGNOSIS — I2510 Atherosclerotic heart disease of native coronary artery without angina pectoris: Secondary | ICD-10-CM

## 2020-03-01 DIAGNOSIS — D171 Benign lipomatous neoplasm of skin and subcutaneous tissue of trunk: Secondary | ICD-10-CM

## 2020-03-01 DIAGNOSIS — Z951 Presence of aortocoronary bypass graft: Secondary | ICD-10-CM | POA: Diagnosis not present

## 2020-03-01 NOTE — Patient Instructions (Signed)
Medication Instructions:  CONTINUE WITH CURRENT MEDICATIONS. NO CHANGES.  *If you need a refill on your cardiac medications before your next appointment, please call your pharmacy*      Follow-Up: At [facility], you and your health needs are our priority.  As part of our continuing mission to provide you with exceptional heart care, we have created designated Provider Care Teams.  These Care Teams include your primary Cardiologist (physician) and Advanced Practice Providers (APPs -  Physician Assistants and Nurse Practitioners) who all work together to provide you with the care you need, when you need it.  We recommend signing up for the patient portal called "MyChart".  Sign up information is provided on this After Visit Summary.  MyChart is used to connect with patients for Virtual Visits (Telemedicine).  Patients are able to view lab/test results, encounter notes, upcoming appointments, etc.  Non-urgent messages can be sent to your provider as well.   To learn more about what you can do with MyChart, go to [email or web address].    Your next appointment:   12 month(s)  The format for your next appointment:   In Person  Provider:   Dr.Kelly    

## 2020-03-01 NOTE — Progress Notes (Signed)
Patient ID: Daryl Joseph, male   DOB: 31-Jan-1933, 86 y.o.   MRN: [id number]    HPI: Daryl Joseph is a 86 y.o. male who presents to the office today for an 19 month cardiology evaluation.  Daryl. Poage has established CAD and in 1999 underwent CABG surgery with a LIMA to the LAD, vein graft to diagonal, sequential vein graft to the obtuse marginal distal circumflex coronary artery and vein graft to the RCA. 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 In November 2013 he suffered a non-ST segment elevation myocardial infarction and underwent initial catheterization by [name]. I ultimately performed intervention a high-grade ulcerated plaque graft stenosis supplying a diagonal vessel on 06/25/2012 and a 3.5x33 mm  Xience Xpedition DES stent was inserted and post dilated 3.9 mm. Once his graft was opened, the proximal LAD was now significantly improved with reference to being filled from the diagonal vessel.    On a one year followup nuclear perfusion study he had held his metoprolol, amlodipine and isosorbide for the stress test. He did not develop any chest pain. However, he did have an exaggerated hypertensive blood pressure response to exercise and developed 3 mm ST segment depression. Scintigraphic images were low risk and specifically he had normal flow in the diagonal vessel territory.   In December 2014, he was concerned about the price of Brilinta.  Since his been 13 months since his ACS intervention, I switched him to Plavix and P2Y12 testing verifiend plavix sensitivity.   He has been on fenofibrate and Zetia for hyperlipidemia in light of his statin hypersensitivity.  Laboratory in March 2015 revealed a cholesterol was 177, triglycerides 68, HDL 68, LDL 95.  Hemoglobin A1c was 7.0.  He went 21, creatinine 0.88.  Normal LFTs.  He brought with him.  Laboratory which was done in [place], [place] on  04/18/2014.  I reviewed this in detail with him.  He had normal thyroid function studies.  Hemoglobin 14.8, hematocrit 44.4.  Glucose was 142.  LDL cholesterol was 81 with total cholesterol 165, HDL 67, triglycerides 83.  Hemoglobin A1c was increased at 7.4.  PSA was 2.32.  When I saw him in December 2015 he complained of developing similar chest tightness with walking over several weeks, which was somewhat nitrate responsive.  He underwent repeat cardiac catheterization on 08/01/2014 which showedLow normal LV function with an ejection fraction of 50-55% and mild distal inferior hypocontractility. There was severe native CAD with 80% distal left main stenosis and total occlusion of the LAD in its ostium, 95% stenosis in the OM 2 vessel the circumflex with 30% narrowing in the OM1 branch proximal to the graft anastomoses, and total occlusion of the mid RCA. He had a patent LIMA graft supplying the mid LAD with 30-40% smooth mid LAD stenosis beyond the anastomosis., a widely patent stent in the mid distal aspect of the SVG supplying the diagonal vessel without evidence for restenosis., a patent sequential vein graft supplying the OM1 and OM 2 vessel with smooth 20% narrowing in the proximal portion of the sequential limb just beyond the OM1 anastomosis and a patent SVG supplying a dominant RCA with 80% mid PDA stenosis and a small PDA caliber vessel.  Increased medical therapy was recommended with titration of his nitrates and beta blocker and possible initiation of Ranexa.  Since I last saw him, Daryl Joseph has felt well without recurrent anginal symptomatology or  significant shortness of breath on his increased medical regimen.  He denies recurrent anginal symptoms.  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  He has GERD and his Prilosec was changed to Protonix due to potential Plavix interaction.  He denies any presyncope or syncope, PND, orthopnea.    An echo Doppler study  in August 2017 to further evaluate his cardiac murmur demonstrated normal systolic function with grade 1 diastolic dysfunction.  There was mild aortic sclerosis.  Mean gradient 5 peak gradient 11.  There was mild Daryl.  Had normal pulmonic pressures.  There was mild TR.    He underwent laboratory by his primary physician in November 2017.  Glucose was increased at 146.  BUN was 20 and creatinine 0.96.  Liver function studies were normal.  TSH was normal at 1.45.  CBC was normal.  Lipid studies revealed a total cholesterol 159, triglycerides 65, HDL 73, and LDL 73.  Hemoglobin A1c was minimally increased at 6.6.  PSA was normal.    I last saw him in February 2019.  At that time, he was having issues with bursitis and arthritis of his hip and has undergone 3 steroid injections.   Laboratory in November 2018 was done at the time he was having steroid injections which may have contributed to his glucose being elevated at 142, and hemoglobin A1c increased at 7.6.  He has continued to be on Zetia for hyperlipidemia, and remotely had LFT elevation with Mevacor.  Lipid studies were very good on Zetia with a total cholesterol 156, triglycerides 54, HDL 79, and LDL 66.  He normal LFTs and renal function.    His follow-up appointment had been rescheduled due to the [place] pandemic.  I last saw him in August 2020 after a 16-monthinterval.  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  Glucose was 134.  He had normal renal function with a creatinine of 0.98.  LFTs were normal.  Hemoglobin A1c was 7.0.  He was on amlodipine 10 mg, benazepril 40 mg, isosorbide 60 mg, and Toprol-XL 75 mg.  His blood pressure at home typically is in the 130s.  When his laboratory was checked, blood pressure was 138/63 in June at his primary care  office.  He was on Zetia and fenofibrate for hyperlipidemia, metformin 500 mg twice a day for diabetes mellitus and pantoprazole for GERD. At times he has noticed some intermittent ankle swelling. He tries to watch his sodium intake but at times he does get some excess sodium in certain foods.    Since his last evaluation, from a cardiology evaluation he has continued to feel well.  He apparently had a right [place] staph infection [place]  At that time he had an echo Doppler done and this apparently was without significant abnormality.  He has had issues with an enlarged prostate and [place]  He denies anginal symptoms.  He denies PND orthopnea.  At times he notes mild ankle swelling.  He presents for evaluation.   Past Medical History:  Diagnosis Date  . Anginal pain ([place]   . Arthritis    "mostly in my hands" (06/25/2012)  .  CAD (coronary artery disease) 03/25/2007   30 d monitor - sinus rhythm w/ some PACs  . DM type 2 (diabetes mellitus, type 2) ([place])   . GERD (gastroesophageal reflux disease)   . H/O hiatal hernia   . Hyperlipidemia   . Hypertension   . NSTEMI (non-ST elevated myocardial infarction) ([place]) 06/18/2012  . S/P angioplasty with stent, to VG -diag 1, LAD and septal perferator vessels  06/25/2012    Past Surgical History:  Procedure Laterality Date  . CARDIAC CATHETERIZATION  06/25/2012   3.5 x 59m Xience Xpedition DES inserted in distal third of LAD  . CARDIOVASCULAR STRESS TEST  11/05/2010   R/S MV - EF 73%; post stress LV fcn normal, no significant wall motion abnormalities; perfusion defect in inferior myocardial region consistent w/ diaphragmatic attenuation, remaining myocardium demonstrates normal perfusion; Exercise capacity 10 METS; stress EKG showed changes from baseline EKG; study results unchanged/within normal variance of last study (09/2008)  . CERVICAL DISC  SURGERY  1990's  . CORONARY ANGIOPLASTY WITH STENT PLACEMENT  06/25/2012   "1; first one for me" (06/25/2012)  . CORONARY ARTERY BYPASS GRAFT  10/1997   CABG X 4 LIMA to LAD; vein to diagonal; sequential vein to obtuse marginal and distal circumflex and vein to RCA  . DOPPLER ECHOCARDIOGRAPHY  09/23/2011   EF >55%; proximal septal thickening noted; mild septal hypokinesis; ; mild/mod tricuspid regurgitation; aortic root sclerosis/calcification  . LEFT HEART CATHETERIZATION WITH CORONARY/GRAFT ANGIOGRAM N/A 06/21/2012   Procedure: LEFT HEART CATHETERIZATION WITH CBeatrix Fetters  Surgeon: MSanda Klein MD;  [facility];  Service: Cardiovascular;  Laterality: N/A;  . LEFT HEART CATHETERIZATION WITH CORONARY/GRAFT ANGIOGRAM N/A 08/01/2014   Procedure: LEFT HEART CATHETERIZATION WITH CBeatrix Fetters  Surgeon: TTroy Sine MD;  [facility];  Service: Cardiovascular;  Laterality: N/A;  . PERCUTANEOUS CORONARY STENT INTERVENTION (PCI-S) N/A 06/25/2012   Procedure: PERCUTANEOUS CORONARY STENT INTERVENTION (PCI-S);  Surgeon: TTroy Sine MD;  [facility];  Service: Cardiovascular;  Laterality: N/A;  . TONSILLECTOMY AND ADENOIDECTOMY     "when I was a kid" (06/25/2012)    Allergies  Allergen Reactions  . Statins Other (See Comments)    Myositis with statins "affected my liver; I almost had Hepatitis" (06/25/2012)  . Welchol [Colesevelam]     Current Outpatient Medications  Medication Sig Dispense Refill  . amLODipine (NORVASC) 10 MG tablet TAKE 1 TABLET BY MOUTH  DAILY 90 tablet 3  . aspirin 81 MG tablet Take 81 mg by mouth daily.    . benazepril (LOTENSIN) 40 MG tablet TAKE 1 TABLET BY MOUTH  DAILY 90 tablet 3  . clopidogrel (PLAVIX) 75 MG tablet TAKE 1 TABLET BY MOUTH  DAILY 90 tablet 3  . ezetimibe (ZETIA) 10 MG tablet Take 1 tablet (10 mg total) by mouth at bedtime. 180 tablet 2  . fenofibrate 160 MG tablet TAKE 1 TABLET BY MOUTH  DAILY  90 tablet 3  . folic acid (FOLVITE) 1 MG tablet Take one tablet by mouth daily 90 tablet 1  . isosorbide mononitrate (IMDUR) 60 MG 24 hr tablet TAKE 1 TABLET BY MOUTH  DAILY 90 tablet 3  . metFORMIN (GLUCOPHAGE) 500 MG tablet Take 500 mg by mouth 2 (two) times daily with a meal.     . metoprolol succinate (TOPROL-XL) 50 MG 24 hr tablet TAKE 1 AND 1/2 TABLETS BY  MOUTH ONCE DAILY TAKE WITH  OR IMMEDIATELY FOLLOWING A  MEAL 135 tablet 0  .  Multiple Vitamins-Minerals (EYE VITAMINS PO) Take 1 tablet by mouth every evening.    . nitroGLYCERIN (NITROSTAT) 0.4 MG SL tablet PLACE 1 TABLET UNDER THE TONGUE EVERY 5 MINUTES AS NEEDED FOR CHEST PAIN 25 tablet 0  . pantoprazole (PROTONIX) 40 MG tablet     . hydrochlorothiazide (MICROZIDE) 12.5 MG capsule Take 1 capsule (12.5 mg total) by mouth as needed (swelling). 90 capsule 3  . tamsulosin (FLOMAX) 0.4 MG CAPS capsule Take 1 capsule by mouth in the morning and at bedtime.     No current facility-administered medications for this visit.    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 He is married with no children. He does not routinely exercise. There is no tobacco or alcohol use. 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  ROS General: Negative; No fevers, chills, or night sweats;  HEENT: Negative; No changes in vision or hearing, sinus congestion, difficulty swallowing Pulmonary: Negative; No cough, wheezing, shortness of breath, hemoptysis Cardiovascular: See history of present illness GI: Negative; No nausea, vomiting, diarrhea, or abdominal pain GU: Positive for history of BPH; No dysuria, hematuria, or difficulty voiding Musculoskeletal: Positive for bilateral hip bursitis and arthritis Hematologic/Oncology: Positive for easy bruisability secondary to antiplatelet therapy Endocrine: Positive for type 2 diabetes mellitus; no heat/cold intolerance;  Neuro: Negative; no changes in balance, headaches Skin: Negative; No  rashes or skin lesions Psychiatric: Negative; No behavioral problems, depression Sleep: Negative; No snoring, daytime sleepiness, hypersomnolence, bruxism, restless legs, hypnogognic hallucinations, no cataplexy Other comprehensive 14 point system review is negative.   PE BP (!) 134/60 (BP Location: Left Arm, Patient Position: Sitting, Cuff Size: Normal)   Pulse 63   Ht 5\' 11"  (1.803 m)   Wt 155 lb 6.4 oz (70.5 kg)   BMI 21.67 kg/m    Repeat blood pressure by me was 140/64  Wt Readings from Last 3 Encounters:  03/01/20 155 lb 6.4 oz (70.5 kg)  04/04/19 154 lb 6.4 oz (70 kg)  09/07/17 156 lb 6.4 oz (70.9 kg)   General: Alert, oriented, no distress.  Skin: normal turgor, no rashes, warm and dry HEENT: Normocephalic, atraumatic. Pupils equal round and reactive to light; sclera anicteric; extraocular muscles intact;  Nose without nasal septal hypertrophy Mouth/Parynx benign; Mallinpatti scale 3 Neck: No JVD, no carotid bruits; normal carotid upstroke Lungs: clear to ausculatation and percussion; no wheezing or rales Chest wall: without tenderness to palpitation Heart: PMI not displaced, RRR, s1 s2 normal, 1/6 systolic murmur, no diastolic murmur, no rubs, gallops, thrills, or heaves Abdomen: Moderate size diastases recti; soft, nontender; no hepatosplenomehaly, BS+; abdominal aorta nontender and not dilated by palpation. Back: no CVA tenderness; lipoma in the right lower back and also at the right flank Pulses 2+ Musculoskeletal: full range of motion, normal strength, no joint deformities Extremities: no clubbing cyanosis or edema, Homan's sign negative  Neurologic: grossly nonfocal; Cranial nerves grossly wnl Psychologic: Normal mood and affect   ECG (independently read by me): NSR at 63; Ist degree AV block; PR 244 msec; no ST changes  February 2019 ECG (independently read by me): Normal sinus rhythm at 68 bpm; first-degree AV block with a PR interval of 250 ms.  No ectopy.   Nonspecific ST changes.  January 2018 ECG (independently read by me): Normal sinus rhythm at 70 bpm with first-degree AV block with a PR interval at 270 ms.  Nonspecific T changes  October 2016 ECG (independently read by me): Normal sinus  rhythm with mild sinus arrhythmia at 67 bpm.  First-degree AV block with a PR interval at 238 ms.  December 2015 ECG (independently read by me): Sinus rhythm with first-degree AV block with a PR interval at 244 ms.  No significant ST segment changes.  Prior June 2015 ECG: Normal sinus rhythm at 63 beats per minute.  First degree AV block with PR interval at 236 ms.  No significant ST changes.  LABS: I reviewed laboratory from 07/02/2017.  Hemoglobin 14.1, hematocrit 42.6.  Glucose 142, BUN 19, creatinine 0.96.  Normal LFTs.  Hemoglobin A1c 7.6.  PSA 2.6. Total cholesterol 156, TG 54, HDL 79, VLDL 11, LDL 66.  I reviewed recent blood work from 12/17/2015 done in [place], [place].   Glucose was 138.  BUN 17, creatinine 0.91.  LFTs normal. Lipid studies revealed cholesterol 150, HDL 66, triglycerides 78, LDL 68,  HbA1c 7.0  November 2017.  Glucose was increased at 146.  BUN was 20 and creatinine 0.96.  Liver function studies were normal.  TSH was normal at 1.45.  CBC was normal.  Lipid studies revealed a total cholesterol 159, triglycerides 65, HDL 73, and LDL 73.  Hemoglobin A1c was minimally increased at 6.6.  PSA was normal.  BMP Latest Ref Rng & Units 07/31/2014 06/26/2012 06/25/2012  Glucose 70 - 99 mg/dL 98 132(H) 176(H)  BUN 6 - 23 mg/dL _0 Creatinine 0.50 - 1.35 mg/dL 0.90 0.79 0.80  Sodium 135 - 145 mEq/L 143 140 139  Potassium 3.5 - 5.3 mEq/L 4.9 4.0 4.6  Chloride 96 - 112 mEq/L 104 106 103  CO2 19 - 32 mEq/L _1 Calcium 8.4 - 10.5 mg/dL 10.2 9.3 9.9   Hepatic Function Latest Ref Rng & Units 07/31/2014 06/21/2012  Total Protein 6.0 - 8.3 g/dL 7.0 6.7  Albumin 3.5 - 5.2 g/dL 4.7 3.5  AST 0 - 37 U/L 18 34  ALT 0 - 53 U/L  20 27  Alk Phosphatase 39 - 117 U/L 50 65  Total Bilirubin 0.2 - 1.2 mg/dL 0.5 0.4  Bilirubin, Direct 0.0 - 0.3 mg/dL - <0.1   CBC Latest Ref Rng & Units 07/31/2014 06/26/2012 06/25/2012  WBC 4.0 - 10.5 K/uL 8.0 7.7 7.5  Hemoglobin 13.0 - 17.0 g/dL 14.7 13.4 13.9  Hematocrit 39 - 52 % 43.5 38.7(L) 40.5  Platelets 150 - 400 K/uL 224 187 205   Lab Results  Component Value Date   MCV 86.3 07/31/2014   MCV 86.0 06/26/2012   MCV 87.1 06/25/2012   Lab Results  Component Value Date   TSH 1.168 07/31/2014   Lab Results  Component Value Date   HGBA1C 6.8 (H) 06/18/2012   Lipid Panel     Component Value Date/Time   CHOL 187 07/31/2014 1224   TRIG 199 (H) 07/31/2014 1224   HDL 59 07/31/2014 1224   CHOLHDL 3.2 07/31/2014 1224   VLDL 40 07/31/2014 1224   LDLCALC 88 07/31/2014 1224    RADIOLOGY: No results found.  IMPRESSION:  1. Coronary artery disease involving native coronary artery of native heart without angina pectoris   2. Hx of CABG   3. Essential hypertension   4. Mixed hyperlipidemia   5. Lipoma of torso   6. Type 2 diabetes mellitus with other circulatory complication, without long-term current use of insulin (HCC)     ASSESSMENT AND PLAN: Daryl Joseph is a young appearing 86 year-old  white male who is status post CABG  revascularization surgery in 1999. In  November 2013 he suffered a non-ST segment elevation myocardial infarction secondary to a high-grade stenosis in the graft supplying his diagonal vessel which was successfully intervened on by me.  He was transitioned off Brilinta to Plavix and had verification of Plavix sensitivity by P2Y12 testing.  In December 2015 repeat catheterization showed severe native CAD with patent grafts.  He  had complete resolution of symptomatology with his increased nitrates and beta blocker therapy.  In the past, he developed marked LFT elevation with Mevacor in the 1980s and almost underwent liver biopsy.  He is not on statin since  that time.  In 2017 he had undergone an echo Doppler study for evaluation of a cardiac murmur.  He had normal LV function with grade 1 diastolic dysfunction.  There was mild Daryl and normal PA pressures.  There was no evidence for aortic stenosis.  Presently, with reference to his CAD he is not having any anginal symptomatology on his medical regimen consisting of amlodipine 10 mg, benazepril 40 mg, isosorbide 60 mg, and metoprolol succinate 75 mg daily.  On this regimen his blood pressure for the most part has been stable and blood pressure when taken today was 134/60.  He is diabetic on Metformin 500 mg twice a day.  He continues to be on Zetia 10 mg and fenofibrate 160 mg.  I reviewed recent laboratory from February 02, 2020.  Total cholesterol was 183, triglycerides 88, LDL 73 and HDL 79.  Hemoglobin A1c was 6.7.  He has had issues with enlarged prostate and is undergoing urologic evaluation in [place] and may require UroLift.  At times he has trace ankle edema and he takes HCTZ on a as needed basis.  Clinically he is stable.  His back lipomas are stable and nontender.  He will continue follow-up with his primary provider.  As long as he is stable cardiovascularly I will see him in 1 year for reevaluation.   Troy Sine, MD, [facility]  03/05/2020 9:37 PM

## 2020-03-05 ENCOUNTER — Encounter: Payer: Self-pay | Admitting: Cardiovascular Disease

## 2020-04-09 ENCOUNTER — Other Ambulatory Visit: Payer: Self-pay | Admitting: Cardiovascular Disease

## 2020-04-18 ENCOUNTER — Other Ambulatory Visit: Payer: Self-pay | Admitting: Cardiovascular Disease

## 2020-05-01 ENCOUNTER — Other Ambulatory Visit: Payer: Self-pay | Admitting: Cardiovascular Disease

## 2020-08-02 ENCOUNTER — Other Ambulatory Visit: Payer: Self-pay | Admitting: Cardiovascular Disease

## 2020-09-13 ENCOUNTER — Other Ambulatory Visit: Payer: Self-pay | Admitting: Cardiovascular Disease

## 2020-10-08 ENCOUNTER — Emergency Department (HOSPITAL_COMMUNITY): Payer: Medicare Other

## 2020-10-08 ENCOUNTER — Other Ambulatory Visit: Payer: Self-pay

## 2020-10-08 ENCOUNTER — Emergency Department (HOSPITAL_COMMUNITY)
Admission: EM | Admit: 2020-10-08 | Discharge: 2020-10-08 | Disposition: A | Discharge status: Home / Self Care | Payer: Medicare Other | Attending: Emergency Medicine | Admitting: Emergency Medicine

## 2020-10-08 ENCOUNTER — Telehealth: Payer: Self-pay | Admitting: *Deleted

## 2020-10-08 DIAGNOSIS — Z7902 Long term (current) use of antithrombotics/antiplatelets: Secondary | ICD-10-CM | POA: Insufficient documentation

## 2020-10-08 DIAGNOSIS — H9209 Otalgia, unspecified ear: Secondary | ICD-10-CM | POA: Diagnosis not present

## 2020-10-08 DIAGNOSIS — Z955 Presence of coronary angioplasty implant and graft: Secondary | ICD-10-CM | POA: Insufficient documentation

## 2020-10-08 DIAGNOSIS — Z7982 Long term (current) use of aspirin: Secondary | ICD-10-CM | POA: Insufficient documentation

## 2020-10-08 DIAGNOSIS — Z79899 Other long term (current) drug therapy: Secondary | ICD-10-CM | POA: Insufficient documentation

## 2020-10-08 DIAGNOSIS — Z7984 Long term (current) use of oral hypoglycemic drugs: Secondary | ICD-10-CM | POA: Diagnosis not present

## 2020-10-08 DIAGNOSIS — I10 Essential (primary) hypertension: Secondary | ICD-10-CM | POA: Insufficient documentation

## 2020-10-08 DIAGNOSIS — I2510 Atherosclerotic heart disease of native coronary artery without angina pectoris: Secondary | ICD-10-CM | POA: Insufficient documentation

## 2020-10-08 DIAGNOSIS — Z951 Presence of aortocoronary bypass graft: Secondary | ICD-10-CM | POA: Insufficient documentation

## 2020-10-08 DIAGNOSIS — R079 Chest pain, unspecified: Secondary | ICD-10-CM

## 2020-10-08 DIAGNOSIS — Z87891 Personal history of nicotine dependence: Secondary | ICD-10-CM | POA: Diagnosis not present

## 2020-10-08 DIAGNOSIS — E119 Type 2 diabetes mellitus without complications: Secondary | ICD-10-CM | POA: Insufficient documentation

## 2020-10-08 LAB — CBC
HCT: 40.4 % (ref 39.0–52.0)
Hemoglobin: 13.3 g/dL (ref 13.0–17.0)
MCH: 29.6 pg (ref 26.0–34.0)
MCHC: 32.9 g/dL (ref 30.0–36.0)
MCV: 89.8 fL (ref 80.0–100.0)
Platelets: 208 10*3/uL (ref 150–400)
RBC: 4.5 MIL/uL (ref 4.22–5.81)
RDW: 15.2 % (ref 11.5–15.5)
WBC: 6.2 10*3/uL (ref 4.0–10.5)
nRBC: 0 % (ref 0.0–0.2)

## 2020-10-08 LAB — TROPONIN I (HIGH SENSITIVITY)
Troponin I (High Sensitivity): 6 ng/L (ref <18)
Troponin I (High Sensitivity): 6 ng/L (ref <18)

## 2020-10-08 LAB — BASIC METABOLIC PANEL
Anion gap: 9 (ref 5–15)
BUN: 19 mg/dL (ref 8–23)
CO2: 24 mmol/L (ref 22–32)
Calcium: 9.5 mg/dL (ref 8.9–10.3)
Chloride: 104 mmol/L (ref 98–111)
Creatinine, Ser: 1.07 mg/dL (ref 0.61–1.24)
GFR, Estimated: >60 mL/min (ref >60)
Glucose, Bld: 192 mg/dL — ABNORMAL HIGH (ref 70–99)
Potassium: 4.4 mmol/L (ref 3.5–5.1)
Sodium: 137 mmol/L (ref 135–145)

## 2020-10-08 NOTE — Telephone Encounter (Signed)
The patient's wife came up to the office stating that they were at the store and the patient started having chest pain. She stated that they drove straight here from the store. She has been advised that the patient needs to be seen at the ED. An offer was made to call 911 for her but she declined saying she would take him to the ED herself.

## 2020-10-08 NOTE — ED Triage Notes (Signed)
Pt. Stated, Donnald Garre had this pain in everywhere places. My chest, My left arm, left shoulder, my left ear, down my back. This started 2 weeks ago.

## 2020-10-08 NOTE — ED Provider Notes (Signed)
[facility] EMERGENCY DEPARTMENT Provider Note   CSN: [id number] Arrival date & time: 10/08/20  1610     History Chief Complaint  Patient presents with  . Chest Pain  . Arm Pain  . Otalgia  . Shoulder Pain    Daryl Joseph is a 87 y.o. male.  Patient presents with chest pain. Hs CAD s/p CABG and stents (Dr. Claiborne Billings is cardiologist). Reports that it started 2 weeks ago. It comes and goes. It is in his left chest and radiates into left neck and left shoulder. He reports that it occurs at rest and during exertion. States that episode today was worst he has felt since onset two weeks ago. Pain has gone away now. He did not take anything to improve pain. States compliance with plavix and ASA.   Chest Pain Pain location:  L chest Pain quality: pressure and stabbing   Pain radiates to:  L jaw, L shoulder and L arm Pain severity:  Moderate Onset quality:  Gradual Duration:  2 weeks Timing:  Intermittent Progression:  Worsening Chronicity:  New Relieved by:  None tried Worsened by:  Nothing Ineffective treatments:  Rest Associated symptoms: no abdominal pain, no altered mental status, no back pain, no cough, no fever, no headache, no lower extremity edema, no nausea, no palpitations, no shortness of breath, no vomiting and no weakness   Risk factors: coronary artery disease        Past Medical History:  Diagnosis Date  . Anginal pain ([place])   . Arthritis    "mostly in my hands" (06/25/2012)  . CAD (coronary artery disease) 03/25/2007   30 d monitor - sinus rhythm w/ some PACs  . DM type 2 (diabetes mellitus, type 2) ([place])   . GERD (gastroesophageal reflux disease)   . H/O hiatal hernia   . Hyperlipidemia   . Hypertension   . NSTEMI (non-ST elevated myocardial infarction) ([place]) 06/18/2012  . S/P angioplasty with stent, to VG -diag 1, LAD and septal perferator vessels  06/25/2012    Patient Active Problem List   Diagnosis Date Noted  . Systolic murmur of  aorta 02/17/2016  . Coronary artery disease due to lipid rich plaque   . H/O class III angina pectoris 07/31/2014  . Hyperlipidemia LDL goal <70 07/31/2014  . Statin intolerance 07/31/2014  . GERD (gastroesophageal reflux disease) 01/23/2014  . S/P angioplasty with stent, to VG -diag 1, LAD and septal perferator vessels 06/25/12 06/25/2012  . BPH, symptoms 06/19/2012  . NSTEMI (non-ST elevated myocardial infarction), secondary to ulcerated plaque in VG to diag.  treating medically 06/18/2012  . CABG X 4, LIMA-LAD, SVG-Dx, SVG-OM-CFX, SVG-RCA 1999, now with disease in VG to diag 1, and prox LAD, associated with NSTEMI 06/17/12 06/18/2012  . Hypertension 06/18/2012  . Diabetes mellitus, Type NIDDM 06/18/2012  . Hyperlipidemia, statin intol with a history myositis  06/18/2012    Past Surgical History:  Procedure Laterality Date  . CARDIAC CATHETERIZATION  06/25/2012   3.5 x 43mm Xience Xpedition DES inserted in distal third of LAD  . CARDIOVASCULAR STRESS TEST  11/05/2010   R/S MV - EF 73%; post stress LV fcn normal, no significant wall motion abnormalities; perfusion defect in inferior myocardial region consistent w/ diaphragmatic attenuation, remaining myocardium demonstrates normal perfusion; Exercise capacity 10 METS; stress EKG showed changes from baseline EKG; study results unchanged/within normal variance of last study (09/2008)  . CERVICAL DISC SURGERY  1990's  . CORONARY ANGIOPLASTY WITH STENT PLACEMENT  06/25/2012   "1; first one for me" (06/25/2012)  . CORONARY ARTERY BYPASS GRAFT  10/1997   CABG X 4 LIMA to LAD; vein to diagonal; sequential vein to obtuse marginal and distal circumflex and vein to RCA  . DOPPLER ECHOCARDIOGRAPHY  09/23/2011   EF >55%; proximal septal thickening noted; mild septal hypokinesis; ; mild/mod tricuspid regurgitation; aortic root sclerosis/calcification  . LEFT HEART CATHETERIZATION WITH CORONARY/GRAFT ANGIOGRAM N/A 06/21/2012   Procedure: LEFT HEART  CATHETERIZATION WITH Beatrix Fetters;  Surgeon: Sanda Klein, MD;  Location: [facility] CATH LAB;  Service: Cardiovascular;  Laterality: N/A;  . LEFT HEART CATHETERIZATION WITH CORONARY/GRAFT ANGIOGRAM N/A 08/01/2014   Procedure: LEFT HEART CATHETERIZATION WITH Beatrix Fetters;  Surgeon: Troy Sine, MD;  Location: [facility] CATH LAB;  Service: Cardiovascular;  Laterality: N/A;  . PERCUTANEOUS CORONARY STENT INTERVENTION (PCI-S) N/A 06/25/2012   Procedure: PERCUTANEOUS CORONARY STENT INTERVENTION (PCI-S);  Surgeon: Troy Sine, MD;  Location: [facility] CATH LAB;  Service: Cardiovascular;  Laterality: N/A;  . TONSILLECTOMY AND ADENOIDECTOMY     "when I was a kid" (06/25/2012)       Family History  Problem Relation Age of Onset  . Coronary artery disease Father   . Hypertension Father   . Cancer - Colon Father   . Hypertension Mother   . Coronary artery disease Brother   . Coronary artery disease Paternal Grandfather   . Diabetes Brother   . Cancer - Other Brother        Melanoma  . Cancer - Other Brother        Renal Cell     Social History   Tobacco Use  . Smoking status: Former Smoker    Packs/day: 0.75    Years: 20.00    Pack years: 15.00  . Smokeless tobacco: Never Used  . Tobacco comment: 06/25/2012 "quit smoking cigarettes ~ 40 yr ago"  Substance Use Topics  . Alcohol use: No    Alcohol/week: 0.0 standard drinks  . Drug use: No    Home Medications Prior to Admission medications   Medication Sig Start Date End Date Taking? Authorizing Provider  amLODipine (NORVASC) 10 MG tablet TAKE 1 TABLET BY MOUTH  DAILY 08/06/20   Troy Sine, MD  aspirin 81 MG tablet Take 81 mg by mouth daily.    [provider]  benazepril (LOTENSIN) 40 MG tablet TAKE 1 TABLET BY MOUTH  DAILY 08/06/20   Troy Sine, MD  clopidogrel (PLAVIX) 75 MG tablet TAKE 1 TABLET BY MOUTH  DAILY 08/06/20   Troy Sine, MD  ezetimibe (ZETIA) 10 MG tablet TAKE 1 TABLET (10 MG TOTAL) BY  MOUTH AT BEDTIME 05/02/20   Troy Sine, MD  fenofibrate 160 MG tablet TAKE 1 TABLET BY MOUTH  DAILY 09/14/20   Troy Sine, MD  folic acid (FOLVITE) 1 MG tablet Take one tablet by mouth daily 07/26/15   Troy Sine, MD  hydrochlorothiazide (MICROZIDE) 12.5 MG capsule Take 1 capsule (12.5 mg total) by mouth as needed (swelling). 04/04/19 07/03/19  Troy Sine, MD  isosorbide mononitrate (IMDUR) 60 MG 24 hr tablet TAKE 1 TABLET BY MOUTH  DAILY 08/06/20   Troy Sine, MD  metFORMIN (GLUCOPHAGE) 500 MG tablet Take 500 mg by mouth 2 (two) times daily with a meal.     [provider]  metoprolol succinate (TOPROL-XL) 50 MG 24 hr tablet TAKE 1 AND 1/2 TABLETS BY  MOUTH ONCE DAILY TAKE WITH  OR IMMEDIATELY FOLLOWING  A  MEAL 04/19/20   Troy Sine, MD  Multiple Vitamins-Minerals (EYE VITAMINS PO) Take 1 tablet by mouth every evening.    [provider]  nitroGLYCERIN (NITROSTAT) 0.4 MG SL tablet Place 1 tablet under the tongue every 5 minutes as needed for chest pain 04/10/20   Troy Sine, MD  pantoprazole (PROTONIX) 40 MG tablet  01/29/16   [provider]  tamsulosin (FLOMAX) 0.4 MG CAPS capsule Take 1 capsule by mouth in the morning and at bedtime.    [provider]    Allergies    Statins and Welchol [colesevelam]  Review of Systems   Review of Systems  Constitutional: Negative for chills and fever.  HENT: Negative for ear pain and sore throat.   Eyes: Negative for pain and visual disturbance.  Respiratory: Negative for cough and shortness of breath.   Cardiovascular: Positive for chest pain. Negative for palpitations.  Gastrointestinal: Negative for abdominal pain, nausea and vomiting.  Genitourinary: Negative for dysuria and hematuria.  Musculoskeletal: Negative for arthralgias and back pain.  Skin: Negative for color change and rash.  Neurological: Negative for seizures, syncope, weakness and headaches.  All other systems reviewed  and are negative.   Physical Exam Updated Vital Signs BP (!) 167/59   Pulse 98   Temp 98.2 F (36.8 C) (Oral)   Resp (!) 23   SpO2 98%   Physical Exam Vitals and nursing note reviewed.  Constitutional:      Appearance: He is well-developed and well-nourished.  HENT:     Head: Normocephalic and atraumatic.  Eyes:     Conjunctiva/sclera: Conjunctivae normal.  Cardiovascular:     Rate and Rhythm: Normal rate and regular rhythm.     Pulses:          Radial pulses are 2+ on the right side and 2+ on the left side.  Pulmonary:     Effort: Pulmonary effort is normal. No respiratory distress.     Breath sounds: Normal breath sounds. No wheezing or rhonchi.  Abdominal:     Palpations: Abdomen is soft.     Tenderness: There is no abdominal tenderness.  Musculoskeletal:        General: No edema.     Cervical back: Neck supple.     Right lower leg: No tenderness. No edema.     Left lower leg: No tenderness. No edema.  Skin:    General: Skin is warm and dry.  Neurological:     Mental Status: He is alert.  Psychiatric:        Mood and Affect: Mood and affect normal.     ED Results / Procedures / Treatments   Labs (all labs ordered are listed, but only abnormal results are displayed) Labs Reviewed  BASIC METABOLIC PANEL - Abnormal; Notable for the following components:      Result Value   Glucose, Bld 192 (*)    All other components within normal limits  CBC  TROPONIN I (HIGH SENSITIVITY)  TROPONIN I (HIGH SENSITIVITY)    EKG EKG Interpretation  Date/Time:  Monday October 08 2020 16:19:15 EST Ventricular Rate:  70 PR Interval:  278 QRS Duration: 90 QT Interval:  368 QTC Calculation: 397 R Axis:   64 Text Interpretation: Sinus rhythm with 1st degree A-V block no acute ST/T changes similar to 2013 Confirmed by Sherwood Gambler [phone]) on 10/08/2020 8:07:52 PM   Radiology DG Chest 2 View  Result Date: 10/08/2020 CLINICAL DATA:  Chest pain radiating  into the left  shoulder EXAM: CHEST - 2 VIEW COMPARISON:  06/19/2012 FINDINGS: Cardiac shadow is stable. Postsurgical changes are again seen. Aortic calcifications are noted. Lungs are well aerated bilaterally. No focal infiltrate or effusion is seen. Degenerative changes of the thoracic spine are noted. Extrinsic leads are noted over the chest. Bilateral nipple shadows are again seen. IMPRESSION: No acute abnormality noted. Electronically Signed   By: Inez Catalina M.D.   On: 10/08/2020 17:25    Procedures Procedures   Medications Ordered in ED Medications - No data to display  ED Course  I have reviewed the triage vital signs and the nursing notes.  Pertinent labs & imaging results that were available during my care of the patient were reviewed by me and considered in my medical decision making (see chart for details).    MDM Rules/Calculators/A&P                          Differential considered includes ACS, PNA, PTX, GERD, PUD, Esophageal pathology, MSK pain.  EKG without STE or ST depressions. No acute changes from prior. No STEMI. Normal troponin x2 (6). No NSTEMI.   CXR without evidence for PTX or PNA. Additionally no infectious symptoms or cough to suggest PNA.   Pain not correlative with eating, denies reflux.  No pain radiation to the back. Bilateral and equal pulses in wrist and feet. No mediastinal widening on CXR. Low suspicion for dissection at this time.   HR 66. Pain comes and goes. SpO2 99% on room air. No evidence for DVT. Patient without history of prior. Less likely to be clinically significant PE. Not consistent with history and physical exam.  Do believe patient should follow up with cardiologist tomorrow AM to see if he can get appointment soon given history and episodes of recent chest pain similar to his episodes in the past.   Overall stable for discharge home at this time.  Given patient strict return precautions to return to the emergency department should he believe he  needs emergent care.   Final Clinical Impression(s) / ED Diagnoses Final diagnoses:  Chest pain, unspecified type    Rx / DC Orders ED Discharge Orders    None       , [place], MD 10/09/20 2831    Sherwood Gambler, MD 10/11/20 1654

## 2020-10-09 NOTE — Telephone Encounter (Signed)
Patient's wife is following up regarding chest pain. She states she took the patient to the ED as instructed yesterday. She reports he is now back at home, feeling better.    Pt c/o of Chest Pain: STAT if CP now or developed within 24 hours  1. Are you having CP right now? Patient's wife states the patient is not having chest pain right now  2. Are you experiencing any other symptoms (ex. SOB, nausea, vomiting, sweating)? Left arm pain   3. How long have you been experiencing CP? 1 week   4. Is your CP continuous or coming and going? Coming and going   5. Have you taken Nitroglycerin? Patient took 1 about a week ago, per patient's wife ?

## 2020-10-09 NOTE — Telephone Encounter (Signed)
Spoke with pt wife, he went to ER and checked out okay but he needs to be seen because when he left the ER and walked up the incline to the car he had some chest pain. Currently he is fine. He has NTG and knows how to use it. Follow up scheduled with dr Claiborne Billings 10/17/20 @ 4 pm. The patient will take it easy until that appointment. The wife is upset because she had trouble getting into the office on the phone. If the patient has problems prior to appointment they will go to the ER.

## 2020-10-09 NOTE — Telephone Encounter (Signed)
Attempted to call pt voice mailbox not set up unable to leave message Will try later ./cy

## 2020-10-13 ENCOUNTER — Other Ambulatory Visit: Payer: Self-pay | Admitting: Cardiovascular Disease

## 2020-10-17 ENCOUNTER — Other Ambulatory Visit: Payer: Self-pay

## 2020-10-17 ENCOUNTER — Ambulatory Visit: Payer: Medicare Other | Admitting: Cardiovascular Disease

## 2020-10-17 ENCOUNTER — Encounter: Payer: Self-pay | Admitting: Cardiovascular Disease

## 2020-10-17 VITALS — BP 144/66 | HR 60 | Ht 70.0 in | Wt 155.6 lb

## 2020-10-17 DIAGNOSIS — I2510 Atherosclerotic heart disease of native coronary artery without angina pectoris: Secondary | ICD-10-CM

## 2020-10-17 DIAGNOSIS — E785 Hyperlipidemia, unspecified: Secondary | ICD-10-CM

## 2020-10-17 DIAGNOSIS — E1159 Type 2 diabetes mellitus with other circulatory complications: Secondary | ICD-10-CM

## 2020-10-17 DIAGNOSIS — Z951 Presence of aortocoronary bypass graft: Secondary | ICD-10-CM | POA: Diagnosis not present

## 2020-10-17 DIAGNOSIS — I25118 Atherosclerotic heart disease of native coronary artery with other forms of angina pectoris: Secondary | ICD-10-CM | POA: Diagnosis not present

## 2020-10-17 DIAGNOSIS — Z789 Other specified health status: Secondary | ICD-10-CM

## 2020-10-17 DIAGNOSIS — I10 Essential (primary) hypertension: Secondary | ICD-10-CM | POA: Diagnosis not present

## 2020-10-17 DIAGNOSIS — Z79899 Other long term (current) drug therapy: Secondary | ICD-10-CM | POA: Diagnosis not present

## 2020-10-17 DIAGNOSIS — U071 COVID-19: Secondary | ICD-10-CM

## 2020-10-17 DIAGNOSIS — Z01812 Encounter for preprocedural laboratory examination: Secondary | ICD-10-CM

## 2020-10-17 MED ORDER — ISOSORBIDE MONONITRATE ER 60 MG PO TB24: ORAL_TABLET | ORAL | 3 refills | Status: DC

## 2020-10-17 NOTE — Patient Instructions (Addendum)
Medication Instructions:  INCREASE- Isosorbide 90 mg (1 1/2 tablet) in the morning and 60 mg (1 tablet) at bedtime.  *If you need a refill on your cardiac medications before your next appointment, please call your pharmacy*   Lab Work: CBC and BMP  If you have labs (blood work) drawn today and your tests are completely normal, you will receive your results only by: Marland Kitchen MyChart Message (if you have MyChart) OR . A paper copy in the mail If you have any lab test that is abnormal or we need to change your treatment, we will call you to review the results.   Testing/Procedures: Your physician has requested that you have a cardiac catheterization. Cardiac catheterization is used to diagnose and/or treat various heart conditions. Doctors may recommend this procedure for a number of different reasons. The most common reason is to evaluate chest pain. Chest pain can be a symptom of coronary artery disease (CAD), and cardiac catheterization can show whether plaque is narrowing or blocking your heart's arteries. This procedure is also used to evaluate the valves, as well as measure the blood flow and oxygen levels in different parts of your heart. For further information please visit [email or web address]. Please follow instruction sheet, as given.   Follow-Up: At [facility], you and your health needs are our priority.  As part of our continuing mission to provide you with exceptional heart care, we have created designated Provider Care Teams.  These Care Teams include your primary Cardiologist (physician) and Advanced Practice Providers (APPs -  Physician Assistants and Nurse Practitioners) who all work together to provide you with the care you need, when you need it.  We recommend signing up for the patient portal called "MyChart".  Sign up information is provided on this After Visit Summary.  MyChart is used to connect with patients for Virtual Visits (Telemedicine).  Patients are able to view  lab/test results, encounter notes, upcoming appointments, etc.  Non-urgent messages can be sent to your provider as well.   To learn more about what you can do with MyChart, go to [email or web address].    Your next appointment:   1 month(s)  The format for your next appointment:   In Person  Provider:   You may see Shelva Majestic, MD or one of the following Advanced Practice Providers on your designated Care Team:    Almyra Deforest, PA-C  Fabian Sharp, [place] or   Roby Lofts, [place]    Other Instructions    [place] [address] Dept: [phone] Loc: [place]  10/17/2020  You are scheduled for a Cardiac Catheterization on Tuesday, March 22 with Dr. Shelva Majestic.  1. Please arrive at the [facility] (Main Entrance A) at [facility]: [address] [place], [place] [address] at 5:30 AM (This time is two hours before your procedure to ensure your preparation). Free valet parking service is available.   Special note: Every effort is made to have your procedure done on time. Please understand that emergencies sometimes delay scheduled procedures.  2. Diet: Do not eat solid foods after midnight.  The patient may have clear liquids until 5am upon the day of the procedure.  3. Labs: You will need to have blood drawn on Thursday, March 17 at [facility]  Open: [place] (Lunch 12:30 - 1:30)   Phone: [phone]. You do not need  to be fasting. 4. COVID19 TEST: Friday March 18th This is a Drive Up Visit at [address] [place], [place] [address] Someone will direct you to the appropriate testing line. Stay in your car and someone will be with you shortly.  4. Medication instructions in preparation for your procedure:   Contrast Allergy: No  Stop taking, Benazepril (Lotensin) Tuesday, March 22,  Do not  take Diabetes Med Glucophage (Metformin) on the day of the procedure and HOLD 48 HOURS AFTER THE PROCEDURE.  On the morning of your procedure, take your Aspirin and Plavix/Clopidogrel and any morning medicines NOT listed above.  You may use sips of water.  5. Plan for one night stay--bring personal belongings. 6. Bring a current list of your medications and current insurance cards. 7. You MUST have a responsible person to drive you home. 8. Someone MUST be with you the first 24 hours after you arrive home or your discharge will be delayed. 9. Please wear clothes that are easy to get on and off and wear slip-on shoes.  Thank you for allowing [place] to care for you!   -- [facility]

## 2020-10-17 NOTE — H&P (View-Only) (Signed)
Patient ID: Daryl Joseph, male   DOB: May 06, 1933, 87 y.o.   MRN: [id number]    HPI: Daryl Joseph is a 87 y.o. male who presents to the office today for an 8 month cardiology evaluation.  Daryl Joseph has established CAD and in 1999 underwent CABG surgery with a LIMA to the LAD, vein graft to diagonal, sequential vein graft to the obtuse marginal distal circumflex coronary artery and vein graft to the RCA. 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 In November 2013 he suffered a non-ST segment elevation myocardial infarction and underwent initial catheterization by [name]. I ultimately performed intervention a high-grade ulcerated plaque graft stenosis supplying a diagonal vessel on 06/25/2012 and a 3.5x33 mm  Xience Xpedition DES stent was inserted and post dilated 3.9 mm. Once his graft was opened, the proximal LAD was now significantly improved with reference to being filled from the diagonal vessel.    On a one year followup nuclear perfusion study he had held his metoprolol, amlodipine and isosorbide for the stress test. He did not develop any chest pain. However, he did have an exaggerated hypertensive blood pressure response to exercise and developed 3 mm ST segment depression. Scintigraphic images were low risk and specifically he had normal flow in the diagonal vessel territory.   In December 2014, he was concerned about the price of Brilinta.  Since his been 13 months since his ACS intervention, I switched him to Plavix and P2Y12 testing verifiend plavix sensitivity.   He has been on fenofibrate and Zetia for hyperlipidemia in light of his statin hypersensitivity.  Laboratory in March 2015 revealed a cholesterol was 177, triglycerides 68, HDL 68, LDL 95.  Hemoglobin A1c was 7.0.  He went 21, creatinine 0.88.  Normal LFTs.  He brought with him.  Laboratory which was done in [place], [place] on  04/18/2014.  I reviewed this in detail with him.  He had normal thyroid function studies.  Hemoglobin 14.8, hematocrit 44.4.  Glucose was 142.  LDL cholesterol was 81 with total cholesterol 165, HDL 67, triglycerides 83.  Hemoglobin A1c was increased at 7.4.  PSA was 2.32.  When I saw him in December 2015 he complained of developing similar chest tightness with walking over several weeks, which was somewhat nitrate responsive.  He underwent repeat cardiac catheterization on 08/01/2014 which showedLow normal LV function with an ejection fraction of 50-55% and mild distal inferior hypocontractility. There was severe native CAD with 80% distal left main stenosis and total occlusion of the LAD in its ostium, 95% stenosis in the OM 2 vessel the circumflex with 30% narrowing in the OM1 branch proximal to the graft anastomoses, and total occlusion of the mid RCA. He had a patent LIMA graft supplying the mid LAD with 30-40% smooth mid LAD stenosis beyond the anastomosis., a widely patent stent in the mid distal aspect of the SVG supplying the diagonal vessel without evidence for restenosis., a patent sequential vein graft supplying the OM1 and OM 2 vessel with smooth 20% narrowing in the proximal portion of the sequential limb just beyond the OM1 anastomosis and a patent SVG supplying a dominant RCA with 80% mid PDA stenosis and a small PDA caliber vessel.  Increased medical therapy was recommended with titration of his nitrates and beta blocker and possible initiation of Ranexa.  Since I last saw him, [name] has felt well without recurrent anginal symptomatology or  significant shortness of breath on his increased medical regimen.  He denies recurrent anginal symptoms.  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  He has GERD and his Prilosec was changed to Protonix due to potential Plavix interaction.  He denies any presyncope or syncope, PND, orthopnea.    An echo Doppler study  in August 2017 to further evaluate his cardiac murmur demonstrated normal systolic function with grade 1 diastolic dysfunction.  There was mild aortic sclerosis.  Mean gradient 5 peak gradient 11.  There was mild MR.  Had normal pulmonic pressures.  There was mild TR.    He underwent laboratory by his primary physician in November 2017.  Glucose was increased at 146.  BUN was 20 and creatinine 0.96.  Liver function studies were normal.  TSH was normal at 1.45.  CBC was normal.  Lipid studies revealed a total cholesterol 159, triglycerides 65, HDL 73, and LDL 73.  Hemoglobin A1c was minimally increased at 6.6.  PSA was normal.    I last saw him in February 2019.  At that time, he was having issues with bursitis and arthritis of his hip and has undergone 3 steroid injections.   Laboratory in November 2018 was done at the time he was having steroid injections which may have contributed to his glucose being elevated at 142, and hemoglobin A1c increased at 7.6.  He has continued to be on Zetia for hyperlipidemia, and remotely had LFT elevation with Mevacor.  Lipid studies were very good on Zetia with a total cholesterol 156, triglycerides 54, HDL 79, and LDL 66.  He normal LFTs and renal function.    His follow-up appointment had been rescheduled due to the [place] pandemic.  I  saw him in August 2020 after a 71-monthinterval.  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  Glucose was 134.  He had normal renal function with a creatinine of 0.98.  LFTs were normal.  Hemoglobin A1c was 7.0.  He was on amlodipine 10 mg, benazepril 40 mg, isosorbide 60 mg, and Toprol-XL 75 mg.  His blood pressure at home typically is in the 130s.  When his laboratory was checked, blood pressure was 138/63 in June at his primary care office.   He was on Zetia and fenofibrate for hyperlipidemia, metformin 500 mg twice a day for diabetes mellitus and pantoprazole for GERD. At times he has noticed some intermittent ankle swelling. He tries to watch his sodium intake but at times he does get some excess sodium in certain foods.    Last saw him in July 2021 and since his prior evaluation he continues to be stable from a cardiac standpoint.  Unfortunately he had a  right [place] staph [place]  At that time he had an echo Doppler done and this apparently was without significant abnormality.  He has had issues with an enlarged prostate and [place].  He denied anginal symptoms, PND, or orthopnea.  At times he notes mild ankle swelling.    Mr. CCarlis Abbottstates that he suffered Covid infection over 8 weeks ago with symptoms of cough and runny nose.  Over the  past 2 weeks, he had developed episodic chest tightness and pressure.  He was evaluated in the emergency room on October 08, 2020.  Blood pressure was elevated.  Troponins were negative.  Subsequently, he has continued to experience episodes of chest pressure and tightness which mimic his previous anginal symptomatology when he was found to have high-grade stenosis in his vein graft.  He was worked into my schedule today for further evaluation.   Past Medical History:  Diagnosis Date  . Anginal pain ([place])   . Arthritis    "mostly in my hands" (06/25/2012)  . CAD (coronary artery disease) 03/25/2007   30 d monitor - sinus rhythm w/ some PACs  . DM type 2 (diabetes mellitus, type 2) ([place])   . GERD (gastroesophageal reflux disease)   . H/O hiatal hernia   . Hyperlipidemia   . Hypertension   . NSTEMI (non-ST elevated myocardial infarction) ([place]) 06/18/2012  . S/P angioplasty with stent, to VG -diag 1, LAD and septal perferator vessels  06/25/2012    Past Surgical History:  Procedure  Laterality Date  . CARDIAC CATHETERIZATION  06/25/2012   3.5 x 42m Xience Xpedition DES inserted in distal third of LAD  . CARDIOVASCULAR STRESS TEST  11/05/2010   R/S MV - EF 73%; post stress LV fcn normal, no significant wall motion abnormalities; perfusion defect in inferior myocardial region consistent w/ diaphragmatic attenuation, remaining myocardium demonstrates normal perfusion; Exercise capacity 10 METS; stress EKG showed changes from baseline EKG; study results unchanged/within normal variance of last study (09/2008)  . CERVICAL DISC SURGERY  1990's  . CORONARY ANGIOPLASTY WITH STENT PLACEMENT  06/25/2012   "1; first one for me" (06/25/2012)  . CORONARY ARTERY BYPASS GRAFT  10/1997   CABG X 4 LIMA to LAD; vein to diagonal; sequential vein to obtuse marginal and distal circumflex and vein to RCA  . DOPPLER ECHOCARDIOGRAPHY  09/23/2011   EF >55%; proximal septal thickening noted; mild septal hypokinesis; ; mild/mod tricuspid regurgitation; aortic root sclerosis/calcification  . LEFT HEART CATHETERIZATION WITH CORONARY/GRAFT ANGIOGRAM N/A 06/21/2012   Procedure: LEFT HEART CATHETERIZATION WITH CBeatrix Fetters  Surgeon: MSanda Klein MD;  [facility];  Service: Cardiovascular;  Laterality: N/A;  . LEFT HEART CATHETERIZATION WITH CORONARY/GRAFT ANGIOGRAM N/A 08/01/2014   Procedure: LEFT HEART CATHETERIZATION WITH CBeatrix Fetters  Surgeon: TTroy Sine MD;  [facility];  Service: Cardiovascular;  Laterality: N/A;  . PERCUTANEOUS CORONARY STENT INTERVENTION (PCI-S) N/A 06/25/2012   Procedure: PERCUTANEOUS CORONARY STENT INTERVENTION (PCI-S);  Surgeon: TTroy Sine MD;  [facility];  Service: Cardiovascular;  Laterality: N/A;  . TONSILLECTOMY AND ADENOIDECTOMY     "when I was a kid" (06/25/2012)    Allergies  Allergen Reactions  . Statins Other (See Comments)    Myositis with statins "affected my liver; I almost had Hepatitis"  (06/25/2012)  . Welchol [Colesevelam] Other (See Comments)    unknown    Current Outpatient Medications  Medication Sig Dispense Refill  . amLODipine (NORVASC) 10 MG tablet TAKE 1 TABLET BY MOUTH  DAILY (Patient taking differently: Take 10 mg by mouth daily.) 90 tablet 3  . aspirin 81 MG tablet Take 81 mg by mouth daily.    . benazepril (LOTENSIN) 40 MG tablet TAKE 1 TABLET BY MOUTH  DAILY (Patient taking differently: Take 40 mg by mouth daily.) 90 tablet 3  . clopidogrel (PLAVIX) 75 MG tablet TAKE 1 TABLET BY MOUTH  DAILY (Patient taking differently: Take 75 mg  by mouth daily.) 90 tablet 3  . ezetimibe (ZETIA) 10 MG tablet TAKE 1 TABLET (10 MG TOTAL) BY MOUTH AT BEDTIME 90 tablet 3  . fenofibrate 160 MG tablet TAKE 1 TABLET BY MOUTH  DAILY (Patient taking differently: Take 160 mg by mouth daily.) 30 tablet 11  . folic acid (FOLVITE) 1 MG tablet Take one tablet by mouth daily (Patient taking differently: Take 1 mg by mouth daily.) 90 tablet 1  . metFORMIN (GLUCOPHAGE) 500 MG tablet Take 500 mg by mouth 2 (two) times daily with a meal.     . metoprolol succinate (TOPROL-XL) 50 MG 24 hr tablet TAKE 1 AND 1/2 TABLETS BY  MOUTH ONCE DAILY TAKE WITH  OR IMMEDIATELY FOLLOWING A  MEAL (Patient taking differently: Take 75 mg by mouth daily.) 135 tablet 3  . Multiple Vitamins-Minerals (EYE VITAMINS PO) Take 1 tablet by mouth every evening.    . nitroGLYCERIN (NITROSTAT) 0.4 MG SL tablet Place 1 tablet under the tongue every 5 minutes as needed for chest pain (Patient taking differently: Place 0.4 mg under the tongue every 5 (five) minutes as needed for chest pain.) 25 tablet 3  . pantoprazole (PROTONIX) 40 MG tablet Take 40 mg by mouth daily.    Marland Kitchen sulfamethoxazole-trimethoprim (BACTRIM DS) 800-160 MG tablet Take 1 tablet by mouth 2 (two) times daily.    . Glycerin-Hypromellose-PEG 400 (DRY EYE RELIEF DROPS) 0.2-0.2-1 % SOLN Place 1 drop into both eyes daily.    . hydrochlorothiazide (MICROZIDE) 12.5 MG  capsule Take 1 capsule (12.5 mg total) by mouth as needed (swelling). (Patient taking differently: Take 12.5 mg by mouth daily as needed (swelling).) 90 capsule 3  . isosorbide mononitrate (IMDUR) 60 MG 24 hr tablet Take 90 mg(1 1/2 tablet) in the morning and 60 mg (1 tablet) at bedtime (Patient taking differently: Take 60-90 mg by mouth See admin instructions. Take 90 mg in the morning and 60 mg at bedtime) 135 tablet 3   No current facility-administered medications for this visit.    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 He is married with no children. He does not routinely exercise. There is no tobacco or alcohol use. 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  ROS General: Negative; No fevers, chills, or night sweats;  HEENT: Negative; No changes in vision or hearing, sinus congestion, difficulty swallowing Pulmonary: Negative; No cough, wheezing, shortness of breath, hemoptysis Cardiovascular: See history of present illness GI: Negative; No nausea, vomiting, diarrhea, or abdominal pain GU: Positive for history of BPH; he underwent UroLift implant Musculoskeletal: Positive for bilateral hip bursitis and arthritis Hematologic/Oncology: Positive for easy bruisability secondary to antiplatelet therapy Endocrine: Positive for type 2 diabetes mellitus; no heat/cold intolerance;  Neuro: Negative; no changes in balance, headaches Skin: Negative; No rashes or skin lesions Psychiatric: Negative; No behavioral problems, depression Sleep: Negative; No snoring, daytime sleepiness, hypersomnolence, bruxism, restless legs, hypnogognic hallucinations, no cataplexy Other comprehensive 14 point system review is negative.   PE BP (!) 144/66   Pulse 60   Ht _0  (1.778 m)   Wt 155 lb 9.6 oz (70.6 kg)   BMI 22.33 kg/m    Repeat blood pressure by me was 138/70  Wt Readings from Last 3 Encounters:  10/17/20 155 lb 9.6 oz (70.6 kg)  03/01/20 155 lb 6.4 oz (70.5 kg)   04/04/19 154 lb 6.4 oz (70 kg)   General: Alert, oriented, no distress.  Skin: normal turgor, no  rashes, warm and dry HEENT: Normocephalic, atraumatic. Pupils equal round and reactive to light; sclera anicteric; extraocular muscles intact;  Nose without nasal septal hypertrophy Mouth/Parynx benign; Mallinpatti scale 3 Neck: No JVD, no carotid bruits; normal carotid upstroke Lungs: clear to ausculatation and percussion; no wheezing or rales Chest wall: without tenderness to palpitation Heart: PMI not displaced, RRR, s1 s2 normal, 1/6 systolic murmur, no diastolic murmur, no rubs, gallops, thrills, or heaves Abdomen: soft, nontender; no hepatosplenomehaly, BS+; abdominal aorta nontender and not dilated by palpation. Back: no CVA tenderness Pulses 2+ Musculoskeletal: full range of motion, normal strength, no joint deformities Extremities: no clubbing cyanosis or edema, Homan's sign negative  Neurologic: grossly nonfocal; Cranial nerves grossly wnl Psychologic: Normal mood and affect   ECG (independently read by me): Sinus rhythm at 60; 1st degree AV block  July 2021 ECG (independently read by me): NSR at 63; Ist degree AV block; PR 244 msec; no ST changes  February 2019 ECG (independently read by me): Normal sinus rhythm at 68 bpm; first-degree AV block with a PR interval of 250 ms.  No ectopy.  Nonspecific ST changes.  January 2018 ECG (independently read by me): Normal sinus rhythm at 70 bpm with first-degree AV block with a PR interval at 270 ms.  Nonspecific T changes  October 2016 ECG (independently read by me): Normal sinus rhythm with mild sinus arrhythmia at 67 bpm.  First-degree AV block with a PR interval at 238 ms.  December 2015 ECG (independently read by me): Sinus rhythm with first-degree AV block with a PR interval at 244 ms.  No significant ST segment changes.  Prior June 2015 ECG: Normal sinus rhythm at 63 beats per minute.  First degree AV block with PR interval at  236 ms.  No significant ST changes.  LABS: Reviewed his recent emergency room evaluation and laboratory from October 08, 2020  I reviewed laboratory from 07/02/2017.  Hemoglobin 14.1, hematocrit 42.6.  Glucose 142, BUN 19, creatinine 0.96.  Normal LFTs.  Hemoglobin A1c 7.6.  PSA 2.6. Total cholesterol 156, TG 54, HDL 79, VLDL 11, LDL 66.  I reviewed recent blood work from 12/17/2015 done in [place], [place].   Glucose was 138.  BUN 17, creatinine 0.91.  LFTs normal. Lipid studies revealed cholesterol 150, HDL 66, triglycerides 78, LDL 68,  HbA1c 7.0  November 2017.  Glucose was increased at 146.  BUN was 20 and creatinine 0.96.  Liver function studies were normal.  TSH was normal at 1.45.  CBC was normal.  Lipid studies revealed a total cholesterol 159, triglycerides 65, HDL 73, and LDL 73.  Hemoglobin A1c was minimally increased at 6.6.  PSA was normal.  BMP Latest Ref Rng & Units 10/08/2020 07/31/2014 06/26/2012  Glucose 70 - 99 mg/dL 192(H) 98 132(H)  BUN 8 - 23 mg/dL _0 Creatinine 0.61 - 1.24 mg/dL 1.07 0.90 0.79  Sodium 135 - 145 mmol/L 137 143 140  Potassium 3.5 - 5.1 mmol/L 4.4 4.9 4.0  Chloride 98 - 111 mmol/L 104 104 106  CO2 22 - 32 mmol/L _1 Calcium 8.9 - 10.3 mg/dL 9.5 10.2 9.3   Hepatic Function Latest Ref Rng & Units 07/31/2014 06/21/2012  Total Protein 6.0 - 8.3 g/dL 7.0 6.7  Albumin 3.5 - 5.2 g/dL 4.7 3.5  AST 0 - 37 U/L 18 34  ALT 0 - 53 U/L 20 27  Alk Phosphatase 39 - 117 U/L 50 65  Total Bilirubin 0.2 -  1.2 mg/dL 0.5 0.4  Bilirubin, Direct 0.0 - 0.3 mg/dL - <0.1   CBC Latest Ref Rng & Units 10/08/2020 07/31/2014 06/26/2012  WBC 4.0 - 10.5 K/uL 6.2 8.0 7.7  Hemoglobin 13.0 - 17.0 g/dL 13.3 14.7 13.4  Hematocrit 39.0 - 52.0 % 40.4 43.5 38.7(L)  Platelets 150 - 400 K/uL 208 224 187   Lab Results  Component Value Date   MCV 89.8 10/08/2020   MCV 86.3 07/31/2014   MCV 86.0 06/26/2012   Lab Results  Component Value Date   TSH 1.168 07/31/2014    Lab Results  Component Value Date   HGBA1C 6.8 (H) 06/18/2012   Lipid Panel     Component Value Date/Time   CHOL 187 07/31/2014 1224   TRIG 199 (H) 07/31/2014 1224   HDL 59 07/31/2014 1224   CHOLHDL 3.2 07/31/2014 1224   VLDL 40 07/31/2014 1224   LDLCALC 88 07/31/2014 1224    RADIOLOGY: No results found.  IMPRESSION:  1. Coronary artery disease involving native coronary artery of native heart with other form of angina pectoris ([place])   2. Hx of CABG   3. Essential hypertension   4. Medication management   5. Hyperlipidemia LDL goal <70   6. Type 2 diabetes mellitus with other circulatory complication, without long-term current use of insulin (HCC)   7. Statin intolerance   8. Pre-operative laboratory examination   9. COVID-19: January 2022     ASSESSMENT AND PLAN: Mr. Bonawitz is a young appearing 87 year-old white male who is status post CABG revascularization surgery in 1999. 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  He was transitioned off Brilinta to Plavix and had verification of Plavix sensitivity by P2Y12 testing.  In December 2015 repeat catheterization showed severe native CAD with patent grafts.  He  had complete resolution of symptomatology with his increased nitrates and beta blocker therapy.  In the past, he developed marked LFT elevation with Mevacor in the 1980s and almost underwent liver biopsy.  He has not on statin since that time.  In 2017 an echo Doppler study for evaluation of a cardiac murmur demonstrated normal LV function with grade 1 diastolic dysfunction.  There was mild MR and normal PA pressures.  There was no evidence for aortic stenosis.  Over the past 2 weeks, Mr. Litsey believes he is experiencing his previous characteristic substernal chest tightness with left arm radiation consistent with his angina  pectoris.  He has been on amlodipine 10 mg, aspirin/Plavix, isosorbide 60 mg daily in addition to metoprolol succinate 75 mg.  With his increasing symptomatology, I have recommended he increase his isosorbide and take 90 mg in the morning and 30 mg at night.  His resting is currently 60 and I will not increase his metoprolol succinate from 75 mg presently.  He is now 23 years status post CABG revascularization surgery.  With his recurrent symptomatology I have recommended definitive repeat cardiac catheterization.I have reviewed the risks, indications, and alternatives to cardiac catheterization, possible angioplasty, and stenting with the patient. Risks include but are not limited to bleeding, infection, vascular injury, stroke, myocardial infection, arrhythmia, kidney injury, radiation-related injury in the case of prolonged fluoroscopy use, emergency cardiac surgery, and death. The patient understands the risks of serious complication is 1-2 in 1517 with diagnostic cardiac cath and 1-2% or less with angioplasty/stenting.  Both he  and his wife feel this is the approach that they want to have done.  He continues to be on Zetia 10 mg and fenofibrate 160 mg for his hyperlipidemia.  Recent laboratory was reviewed from [facility] from August 11, 2020.  Total cholesterol was 173, triglycerides 95, HDL 67, VLDL 17 and LDL 89.  He may be a candidate for the addition of bempedoic acid to his regimen or possible PCSK9 inhibition.  Hemoglobin A1c was 7.2 and he continues to be on metformin 500 mg twice a day for his diabetes mellitus.  He will undergo Covid testing and we will schedule him for cardiac catheterization with possible intervention to be done on October 23, 2020.   Troy Sine, MD, [facility]  10/18/2020 6:15 PM

## 2020-10-17 NOTE — Progress Notes (Signed)
Patient ID: Daryl Joseph, male   DOB: 1932-09-23, 87 y.o.   MRN: [id number]    HPI: Daryl Joseph is a 87 y.o. male who presents to the office today for an 8 month cardiology evaluation.  Daryl Joseph has established CAD and in 1999 underwent CABG surgery with a LIMA to the LAD, vein graft to diagonal, sequential vein graft to the obtuse marginal distal circumflex coronary artery and vein graft to the RCA. 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 In November 2013 he suffered a non-ST segment elevation myocardial infarction and underwent initial catheterization by [name]. I ultimately performed intervention a high-grade ulcerated plaque graft stenosis supplying a diagonal vessel on 06/25/2012 and a 3.5x33 mm  Xience Xpedition DES stent was inserted and post dilated 3.9 mm. Once his graft was opened, the proximal LAD was now significantly improved with reference to being filled from the diagonal vessel.    On a one year followup nuclear perfusion study he had held his metoprolol, amlodipine and isosorbide for the stress test. He did not develop any chest pain. However, he did have an exaggerated hypertensive blood pressure response to exercise and developed 3 mm ST segment depression. Scintigraphic images were low risk and specifically he had normal flow in the diagonal vessel territory.   In December 2014, he was concerned about the price of Brilinta.  Since his been 13 months since his ACS intervention, I switched him to Plavix and P2Y12 testing verifiend plavix sensitivity.   He has been on fenofibrate and Zetia for hyperlipidemia in light of his statin hypersensitivity.  Laboratory in March 2015 revealed a cholesterol was 177, triglycerides 68, HDL 68, LDL 95.  Hemoglobin A1c was 7.0.  He went 21, creatinine 0.88.  Normal LFTs.  He brought with him.  Laboratory which was done in [place], [place] on  04/18/2014.  I reviewed this in detail with him.  He had normal thyroid function studies.  Hemoglobin 14.8, hematocrit 44.4.  Glucose was 142.  LDL cholesterol was 81 with total cholesterol 165, HDL 67, triglycerides 83.  Hemoglobin A1c was increased at 7.4.  PSA was 2.32.  When I saw him in December 2015 he complained of developing similar chest tightness with walking over several weeks, which was somewhat nitrate responsive.  He underwent repeat cardiac catheterization on 08/01/2014 which showedLow normal LV function with an ejection fraction of 50-55% and mild distal inferior hypocontractility. There was severe native CAD with 80% distal left main stenosis and total occlusion of the LAD in its ostium, 95% stenosis in the OM 2 vessel the circumflex with 30% narrowing in the OM1 branch proximal to the graft anastomoses, and total occlusion of the mid RCA. He had a patent LIMA graft supplying the mid LAD with 30-40% smooth mid LAD stenosis beyond the anastomosis., a widely patent stent in the mid distal aspect of the SVG supplying the diagonal vessel without evidence for restenosis., a patent sequential vein graft supplying the OM1 and OM 2 vessel with smooth 20% narrowing in the proximal portion of the sequential limb just beyond the OM1 anastomosis and a patent SVG supplying a dominant RCA with 80% mid PDA stenosis and a small PDA caliber vessel.  Increased medical therapy was recommended with titration of his nitrates and beta blocker and possible initiation of Ranexa.  Since I last saw him, Daryl Joseph has felt well without recurrent anginal symptomatology or  significant shortness of breath on his increased medical regimen.  He denies recurrent anginal symptoms.  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  He has GERD and his Prilosec was changed to Protonix due to potential Plavix interaction.  He denies any presyncope or syncope, PND, orthopnea.    An echo Doppler study  in August 2017 to further evaluate his cardiac murmur demonstrated normal systolic function with grade 1 diastolic dysfunction.  There was mild aortic sclerosis.  Mean gradient 5 peak gradient 11.  There was mild Daryl.  Had normal pulmonic pressures.  There was mild TR.    He underwent laboratory by his primary physician in November 2017.  Glucose was increased at 146.  BUN was 20 and creatinine 0.96.  Liver function studies were normal.  TSH was normal at 1.45.  CBC was normal.  Lipid studies revealed a total cholesterol 159, triglycerides 65, HDL 73, and LDL 73.  Hemoglobin A1c was minimally increased at 6.6.  PSA was normal.    I last saw him in February 2019.  At that time, he was having issues with bursitis and arthritis of his hip and has undergone 3 steroid injections.   Laboratory in November 2018 was done at the time he was having steroid injections which may have contributed to his glucose being elevated at 142, and hemoglobin A1c increased at 7.6.  He has continued to be on Zetia for hyperlipidemia, and remotely had LFT elevation with Mevacor.  Lipid studies were very good on Zetia with a total cholesterol 156, triglycerides 54, HDL 79, and LDL 66.  He normal LFTs and renal function.    His follow-up appointment had been rescheduled due to the [place] pandemic.  I  saw him in August 2020 after a 56-monthinterval.  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  Glucose was 134.  He had normal renal function with a creatinine of 0.98.  LFTs were normal.  Hemoglobin A1c was 7.0.  He was on amlodipine 10 mg, benazepril 40 mg, isosorbide 60 mg, and Toprol-XL 75 mg.  His blood pressure at home typically is in the 130s.  When his laboratory was checked, blood pressure was 138/63 in June at his primary care office.   He was on Zetia and fenofibrate for hyperlipidemia, metformin 500 mg twice a day for diabetes mellitus and pantoprazole for GERD. At times he has noticed some intermittent ankle swelling. He tries to watch his sodium intake but at times he does get some excess sodium in certain foods.    Last saw him in July 2021 and since his prior evaluation he continues to be stable from a cardiac standpoint.  Unfortunately he had a  right [place] staph infection [place]  At that time he had an echo Doppler done and this apparently was without significant abnormality.  He has had issues with an enlarged prostate [place].  He denied anginal symptoms, PND, or orthopnea.  At times he notes mild ankle swelling.    Daryl Joseph that he suffered Covid infection over 8 weeks ago with symptoms of cough and runny nose.  Over the  past 2 weeks, he had developed episodic chest tightness and pressure.  He was evaluated in the emergency room on October 08, 2020.  Blood pressure was elevated.  Troponins were negative.  Subsequently, he has continued to experience episodes of chest pressure and tightness which mimic his previous anginal symptomatology when he was found to have high-grade stenosis in his vein graft.  He was worked into my schedule today for further evaluation.   Past Medical History:  Diagnosis Date   Anginal pain ([place])    Arthritis    "mostly in my hands" (06/25/2012)   CAD (coronary artery disease) 03/25/2007   30 d monitor - sinus rhythm w/ some PACs   DM type 2 (diabetes mellitus, type 2) ([place])    GERD (gastroesophageal reflux disease)    H/O hiatal hernia    Hyperlipidemia    Hypertension    NSTEMI (non-ST elevated myocardial infarction) ([place]) 06/18/2012   S/P angioplasty with stent, to VG -diag 1, LAD and septal perferator vessels  06/25/2012    Past Surgical History:  Procedure  Laterality Date   CARDIAC CATHETERIZATION  06/25/2012   3.5 x 60m Xience Xpedition DES inserted in distal third of LAD   CARDIOVASCULAR STRESS TEST  11/05/2010   R/S MV - EF 73%; post stress LV fcn normal, no significant wall motion abnormalities; perfusion defect in inferior myocardial region consistent w/ diaphragmatic attenuation, remaining myocardium demonstrates normal perfusion; Exercise capacity 10 METS; stress EKG showed changes from baseline EKG; study results unchanged/within normal variance of last study (09/2008)   CERVICAL DISC SURGERY  1990's   CORONARY ANGIOPLASTY WITH STENT PLACEMENT  06/25/2012   "1; first one for me" (06/25/2012)   CORONARY ARTERY BYPASS GRAFT  10/1997   CABG X 4 LIMA to LAD; vein to diagonal; sequential vein to obtuse marginal and distal circumflex and vein to RCA   DOPPLER ECHOCARDIOGRAPHY  09/23/2011   EF >55%; proximal septal thickening noted; mild septal hypokinesis; ; mild/mod tricuspid regurgitation; aortic root sclerosis/calcification   LEFT HEART CATHETERIZATION WITH CORONARY/GRAFT ANGIOGRAM N/A 06/21/2012   Procedure: LEFT HEART CATHETERIZATION WITH CBeatrix Fetters  Surgeon: MSanda Klein MD;  [facility];  Service: Cardiovascular;  Laterality: N/A;   LEFT HEART CATHETERIZATION WITH CORONARY/GRAFT ANGIOGRAM N/A 08/01/2014   Procedure: LEFT HEART CATHETERIZATION WITH CBeatrix Fetters  Surgeon: TTroy Sine MD;  [facility];  Service: Cardiovascular;  Laterality: N/A;   PERCUTANEOUS CORONARY STENT INTERVENTION (PCI-S) N/A 06/25/2012   Procedure: PERCUTANEOUS CORONARY STENT INTERVENTION (PCI-S);  Surgeon: TTroy Sine MD;  [facility];  Service: Cardiovascular;  Laterality: N/A;   TONSILLECTOMY AND ADENOIDECTOMY     "when I was a kid" (06/25/2012)    Allergies  Allergen Reactions   Statins Other (See Comments)    Myositis with statins "affected my liver; I almost had Hepatitis"  (06/25/2012)   Welchol [Colesevelam] Other (See Comments)    unknown    Current Outpatient Medications  Medication Sig Dispense Refill   amLODipine (NORVASC) 10 MG tablet TAKE 1 TABLET BY MOUTH  DAILY (Patient taking differently: Take 10 mg by mouth daily.) 90 tablet 3   aspirin 81 MG tablet Take 81 mg by mouth daily.     benazepril (LOTENSIN) 40 MG tablet TAKE 1 TABLET BY MOUTH  DAILY (Patient taking differently: Take 40 mg by mouth daily.) 90 tablet 3   clopidogrel (PLAVIX) 75 MG tablet TAKE 1 TABLET BY MOUTH  DAILY (Patient taking differently: Take 75 mg  by mouth daily.) 90 tablet 3   ezetimibe (ZETIA) 10 MG tablet TAKE 1 TABLET (10 MG TOTAL) BY MOUTH AT BEDTIME 90 tablet 3   fenofibrate 160 MG tablet TAKE 1 TABLET BY MOUTH  DAILY (Patient taking differently: Take 160 mg by mouth daily.) 30 tablet 11   folic acid (FOLVITE) 1 MG tablet Take one tablet by mouth daily (Patient taking differently: Take 1 mg by mouth daily.) 90 tablet 1   metFORMIN (GLUCOPHAGE) 500 MG tablet Take 500 mg by mouth 2 (two) times daily with a meal.      metoprolol succinate (TOPROL-XL) 50 MG 24 hr tablet TAKE 1 AND 1/2 TABLETS BY  MOUTH ONCE DAILY TAKE WITH  OR IMMEDIATELY FOLLOWING A  MEAL (Patient taking differently: Take 75 mg by mouth daily.) 135 tablet 3   Multiple Vitamins-Minerals (EYE VITAMINS PO) Take 1 tablet by mouth every evening.     nitroGLYCERIN (NITROSTAT) 0.4 MG SL tablet Place 1 tablet under the tongue every 5 minutes as needed for chest pain (Patient taking differently: Place 0.4 mg under the tongue every 5 (five) minutes as needed for chest pain.) 25 tablet 3   pantoprazole (PROTONIX) 40 MG tablet Take 40 mg by mouth daily.     sulfamethoxazole-trimethoprim (BACTRIM DS) 800-160 MG tablet Take 1 tablet by mouth 2 (two) times daily.     Glycerin-Hypromellose-PEG 400 (DRY EYE RELIEF DROPS) 0.2-0.2-1 % SOLN Place 1 drop into both eyes daily.     hydrochlorothiazide (MICROZIDE) 12.5 MG  capsule Take 1 capsule (12.5 mg total) by mouth as needed (swelling). (Patient taking differently: Take 12.5 mg by mouth daily as needed (swelling).) 90 capsule 3   isosorbide mononitrate (IMDUR) 60 MG 24 hr tablet Take 90 mg(1 1/2 tablet) in the morning and 60 mg (1 tablet) at bedtime (Patient taking differently: Take 60-90 mg by mouth See admin instructions. Take 90 mg in the morning and 60 mg at bedtime) 135 tablet 3   No current facility-administered medications for this visit.    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 He is married with no children. He does not routinely exercise. There is no tobacco or alcohol use. 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  ROS General: Negative; No fevers, chills, or night sweats;  HEENT: Negative; No changes in vision or hearing, sinus congestion, difficulty swallowing Pulmonary: Negative; No cough, wheezing, shortness of breath, hemoptysis Cardiovascular: See history of present illness GI: Negative; No nausea, vomiting, diarrhea, or abdominal pain GU: Positive for history of BPH; he underwent UroLift implant Musculoskeletal: Positive for bilateral hip bursitis and arthritis Hematologic/Oncology: Positive for easy bruisability secondary to antiplatelet therapy Endocrine: Positive for type 2 diabetes mellitus; no heat/cold intolerance;  Neuro: Negative; no changes in balance, headaches Skin: Negative; No rashes or skin lesions Psychiatric: Negative; No behavioral problems, depression Sleep: Negative; No snoring, daytime sleepiness, hypersomnolence, bruxism, restless legs, hypnogognic hallucinations, no cataplexy Other comprehensive 14 point system review is negative.   PE BP (!) 144/66    Pulse 60    Ht 5' 10"  (1.778 m)    Wt 155 lb 9.6 oz (70.6 kg)    BMI 22.33 kg/m    Repeat blood pressure by me was 138/70  Wt Readings from Last 3 Encounters:  10/17/20 155 lb 9.6 oz (70.6 kg)  03/01/20 155 lb 6.4 oz (70.5 kg)   04/04/19 154 lb 6.4 oz (70 kg)   General: Alert, oriented, no distress.  Skin: normal turgor, no rashes, warm and dry HEENT: Normocephalic, atraumatic. Pupils equal round and reactive to light; sclera anicteric; extraocular muscles intact;  Nose without nasal septal hypertrophy Mouth/Parynx benign; Mallinpatti scale 3 Neck: No JVD, no carotid bruits; normal carotid upstroke Lungs: clear to ausculatation and percussion; no wheezing or rales Chest wall: without tenderness to palpitation Heart: PMI not displaced, RRR, s1 s2 normal, 1/6 systolic murmur, no diastolic murmur, no rubs, gallops, thrills, or heaves Abdomen: soft, nontender; no hepatosplenomehaly, BS+; abdominal aorta nontender and not dilated by palpation. Back: no CVA tenderness Pulses 2+ Musculoskeletal: full range of motion, normal strength, no joint deformities Extremities: no clubbing cyanosis or edema, Homan's sign negative  Neurologic: grossly nonfocal; Cranial nerves grossly wnl Psychologic: Normal mood and affect   ECG (independently read by me): Sinus rhythm at 60; 1st degree AV block  July 2021 ECG (independently read by me): NSR at 63; Ist degree AV block; PR 244 msec; no ST changes  February 2019 ECG (independently read by me): Normal sinus rhythm at 68 bpm; first-degree AV block with a PR interval of 250 ms.  No ectopy.  Nonspecific ST changes.  January 2018 ECG (independently read by me): Normal sinus rhythm at 70 bpm with first-degree AV block with a PR interval at 270 ms.  Nonspecific T changes  October 2016 ECG (independently read by me): Normal sinus rhythm with mild sinus arrhythmia at 67 bpm.  First-degree AV block with a PR interval at 238 ms.  December 2015 ECG (independently read by me): Sinus rhythm with first-degree AV block with a PR interval at 244 ms.  No significant ST segment changes.  Prior June 2015 ECG: Normal sinus rhythm at 63 beats per minute.  First degree AV block with PR interval at  236 ms.  No significant ST changes.  LABS: Reviewed his recent emergency room evaluation and laboratory from October 08, 2020  I reviewed laboratory from 07/02/2017.  Hemoglobin 14.1, hematocrit 42.6.  Glucose 142, BUN 19, creatinine 0.96.  Normal LFTs.  Hemoglobin A1c 7.6.  PSA 2.6. Total cholesterol 156, TG 54, HDL 79, VLDL 11, LDL 66.  I reviewed recent blood work from 12/17/2015 done in [place], [place].   Glucose was 138.  BUN 17, creatinine 0.91.  LFTs normal. Lipid studies revealed cholesterol 150, HDL 66, triglycerides 78, LDL 68,  HbA1c 7.0  November 2017.  Glucose was increased at 146.  BUN was 20 and creatinine 0.96.  Liver function studies were normal.  TSH was normal at 1.45.  CBC was normal.  Lipid studies revealed a total cholesterol 159, triglycerides 65, HDL 73, and LDL 73.  Hemoglobin A1c was minimally increased at 6.6.  PSA was normal.  BMP Latest Ref Rng & Units 10/08/2020 07/31/2014 06/26/2012  Glucose 70 - 99 mg/dL 192(H) 98 132(H)  BUN 8 - 23 mg/dL 19 19 13   Creatinine 0.61 - 1.24 mg/dL 1.07 0.90 0.79  Sodium 135 - 145 mmol/L 137 143 140  Potassium 3.5 - 5.1 mmol/L 4.4 4.9 4.0  Chloride 98 - 111 mmol/L 104 104 106  CO2 22 - 32 mmol/L 24 29 24   Calcium 8.9 - 10.3 mg/dL 9.5 10.2 9.3   Hepatic Function Latest Ref Rng & Units 07/31/2014 06/21/2012  Total Protein 6.0 - 8.3 g/dL 7.0 6.7  Albumin 3.5 - 5.2 g/dL 4.7 3.5  AST 0 - 37 U/L 18 34  ALT 0 - 53 U/L 20 27  Alk Phosphatase 39 - 117 U/L 50 65  Total Bilirubin 0.2 - 1.2 mg/dL 0.5 0.4  Bilirubin, Direct 0.0 - 0.3 mg/dL - <0.1   CBC Latest Ref Rng & Units 10/08/2020 07/31/2014 06/26/2012  WBC 4.0 - 10.5 K/uL 6.2 8.0 7.7  Hemoglobin 13.0 - 17.0 g/dL 13.3 14.7 13.4  Hematocrit 39.0 - 52.0 % 40.4 43.5 38.7(L)  Platelets 150 - 400 K/uL 208 224 187   Lab Results  Component Value Date   MCV 89.8 10/08/2020   MCV 86.3 07/31/2014   MCV 86.0 06/26/2012   Lab Results  Component Value Date   TSH 1.168 07/31/2014    Lab Results  Component Value Date   HGBA1C 6.8 (H) 06/18/2012   Lipid Panel     Component Value Date/Time   CHOL 187 07/31/2014 1224   TRIG 199 (H) 07/31/2014 1224   HDL 59 07/31/2014 1224   CHOLHDL 3.2 07/31/2014 1224   VLDL 40 07/31/2014 1224   LDLCALC 88 07/31/2014 1224    RADIOLOGY: No results found.  IMPRESSION:  1. Coronary artery disease involving native coronary artery of native heart with other form of angina pectoris ([place])   2. Hx of CABG   3. Essential hypertension   4. Medication management   5. Hyperlipidemia LDL goal <70   6. Type 2 diabetes mellitus with other circulatory complication, without long-term current use of insulin (HCC)   7. Statin intolerance   8. Pre-operative laboratory examination   9. COVID-19: January 2022     ASSESSMENT AND PLAN: Daryl Joseph is a young appearing 87 year-old white male who is status post CABG revascularization surgery in 1999. 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  He was transitioned off Brilinta to Plavix and had verification of Plavix sensitivity by P2Y12 testing.  In December 2015 repeat catheterization showed severe native CAD with patent grafts.  He  had complete resolution of symptomatology with his increased nitrates and beta blocker therapy.  In the past, he developed marked LFT elevation with Mevacor in the 1980s and almost underwent liver biopsy.  He has not on statin since that time.  In 2017 an echo Doppler study for evaluation of a cardiac murmur demonstrated normal LV function with grade 1 diastolic dysfunction.  There was mild Daryl and normal PA pressures.  There was no evidence for aortic stenosis.  Over the past 2 weeks, Daryl Joseph believes he is experiencing his previous characteristic substernal chest tightness with left arm radiation consistent with his angina  pectoris.  He has been on amlodipine 10 mg, aspirin/Plavix, isosorbide 60 mg daily in addition to metoprolol succinate 75 mg.  With his increasing symptomatology, I have recommended he increase his isosorbide and take 90 mg in the morning and 30 mg at night.  His resting is currently 60 and I will not increase his metoprolol succinate from 75 mg presently.  He is now 23 years status post CABG revascularization surgery.  With his recurrent symptomatology I have recommended definitive repeat cardiac catheterization.I have reviewed the risks, indications, and alternatives to cardiac catheterization, possible angioplasty, and stenting with the patient. Risks include but are not limited to bleeding, infection, vascular injury, stroke, myocardial infection, arrhythmia, kidney injury, radiation-related injury in the case of prolonged fluoroscopy use, emergency cardiac surgery, and death. The patient understands the risks of serious complication is 1-2 in 0240 with diagnostic cardiac cath and 1-2% or less with  angioplasty/stenting.  Both he and his wife feel this is the approach that they want to have done.  He continues to be on Zetia 10 mg and fenofibrate 160 mg for his hyperlipidemia.  Recent laboratory was reviewed from [place] from August 11, 2020.  Total cholesterol was 173, triglycerides 95, HDL 67, VLDL 17 and LDL 89.  He may be a candidate for the addition of bempedoic acid to his regimen or possible PCSK9 inhibition.  Hemoglobin A1c was 7.2 and he continues to be on metformin 500 mg twice a day for his diabetes mellitus.  He will undergo Covid testing and we will schedule him for cardiac catheterization with possible intervention to be done on October 23, 2020.   Troy Sine, MD, [facility]  10/18/2020 6:15 PM

## 2020-10-18 ENCOUNTER — Encounter: Payer: Self-pay | Admitting: Cardiovascular Disease

## 2020-10-19 ENCOUNTER — Other Ambulatory Visit (HOSPITAL_COMMUNITY)
Admission: RE | Admit: 2020-10-19 | Discharge: 2020-10-19 | Disposition: A | Discharge status: Home / Self Care | Payer: Medicare Other | Source: Ambulatory Visit | Attending: Cardiovascular Disease | Admitting: Cardiovascular Disease

## 2020-10-19 DIAGNOSIS — Z20822 Contact with and (suspected) exposure to covid-19: Secondary | ICD-10-CM | POA: Insufficient documentation

## 2020-10-19 DIAGNOSIS — Z01812 Encounter for preprocedural laboratory examination: Secondary | ICD-10-CM | POA: Diagnosis present

## 2020-10-20 LAB — BASIC METABOLIC PANEL
BUN/Creatinine Ratio: 14 (ref 10–24)
BUN: 15 mg/dL (ref 8–27)
CO2: 23 mmol/L (ref 20–29)
Calcium: 9.8 mg/dL (ref 8.6–10.2)
Chloride: 105 mmol/L (ref 96–106)
Creatinine, Ser: 1.08 mg/dL (ref 0.76–1.27)
Glucose: 158 mg/dL — ABNORMAL HIGH (ref 65–99)
Potassium: 4.7 mmol/L (ref 3.5–5.2)
Sodium: 142 mmol/L (ref 134–144)
eGFR: 67 mL/min/{1.73_m2} (ref >59)

## 2020-10-20 LAB — CBC
Hematocrit: 41 % (ref 37.5–51.0)
Hemoglobin: 13.7 g/dL (ref 13.0–17.7)
MCH: 29.8 pg (ref 26.6–33.0)
MCHC: 33.4 g/dL (ref 31.5–35.7)
MCV: 89 fL (ref 79–97)
Platelets: 192 10*3/uL (ref 150–450)
RBC: 4.6 x10E6/uL (ref 4.14–5.80)
RDW: 13.9 % (ref 11.6–15.4)
WBC: 5.5 10*3/uL (ref 3.4–10.8)

## 2020-10-20 LAB — SARS CORONAVIRUS 2 (TAT 6-24 HRS): SARS Coronavirus 2: NEGATIVE

## 2020-10-22 ENCOUNTER — Telehealth: Payer: Self-pay | Admitting: *Deleted

## 2020-10-22 NOTE — Telephone Encounter (Signed)
Pt contacted pre-catheterization scheduled at [facility] for: Tuesday October 23, 2020 7:30 AM Verified arrival time and place: [facility]) at: 5:30 AM   No solid food after midnight prior to cath, clear liquids until 5 AM day of procedure.  Hold: Metformin-day of procedure and 48 hours post procedure HCTZ-AM of procedure  Except hold medications AM meds can be  taken pre-cath with sips of water including: ASA 81 mg Plavix 75 mg  Confirmed patient has responsible adult to drive home post procedure and be with patient first 24 hours after arriving home: yes  You are allowed ONE visitor in the waiting room during the time you are at the hospital for your procedure. Both you and your visitor must wear a mask once you enter the hospital.  Reviewed procedure /mask/visitor instructions with patient's wife (pt verbal permission).

## 2020-10-23 ENCOUNTER — Other Ambulatory Visit: Payer: Self-pay

## 2020-10-23 ENCOUNTER — Encounter (HOSPITAL_COMMUNITY): Payer: Self-pay | Admitting: Cardiovascular Disease

## 2020-10-23 ENCOUNTER — Ambulatory Visit (HOSPITAL_COMMUNITY)
Admission: RE | Disposition: A | Discharge status: Home / Self Care | Payer: Self-pay | Source: Home / Self Care | Attending: Cardiovascular Disease

## 2020-10-23 ENCOUNTER — Ambulatory Visit (HOSPITAL_COMMUNITY)
Admission: RE | Admit: 2020-10-23 | Discharge: 2020-10-25 | Disposition: A | Discharge status: Home / Self Care | Payer: Medicare Other | Attending: Cardiovascular Disease | Admitting: Cardiovascular Disease

## 2020-10-23 DIAGNOSIS — I25710 Atherosclerosis of autologous vein coronary artery bypass graft(s) with unstable angina pectoris: Secondary | ICD-10-CM

## 2020-10-23 DIAGNOSIS — I25110 Atherosclerotic heart disease of native coronary artery with unstable angina pectoris: Secondary | ICD-10-CM | POA: Diagnosis present

## 2020-10-23 DIAGNOSIS — E1159 Type 2 diabetes mellitus with other circulatory complications: Secondary | ICD-10-CM | POA: Insufficient documentation

## 2020-10-23 DIAGNOSIS — Z955 Presence of coronary angioplasty implant and graft: Secondary | ICD-10-CM | POA: Diagnosis not present

## 2020-10-23 DIAGNOSIS — I10 Essential (primary) hypertension: Secondary | ICD-10-CM | POA: Insufficient documentation

## 2020-10-23 DIAGNOSIS — E785 Hyperlipidemia, unspecified: Secondary | ICD-10-CM | POA: Diagnosis not present

## 2020-10-23 DIAGNOSIS — Z8616 Personal history of COVID-19: Secondary | ICD-10-CM | POA: Insufficient documentation

## 2020-10-23 DIAGNOSIS — R310 Gross hematuria: Secondary | ICD-10-CM | POA: Insufficient documentation

## 2020-10-23 DIAGNOSIS — Z7984 Long term (current) use of oral hypoglycemic drugs: Secondary | ICD-10-CM | POA: Insufficient documentation

## 2020-10-23 DIAGNOSIS — I2582 Chronic total occlusion of coronary artery: Secondary | ICD-10-CM | POA: Insufficient documentation

## 2020-10-23 DIAGNOSIS — Z7982 Long term (current) use of aspirin: Secondary | ICD-10-CM | POA: Insufficient documentation

## 2020-10-23 DIAGNOSIS — Z951 Presence of aortocoronary bypass graft: Secondary | ICD-10-CM | POA: Diagnosis not present

## 2020-10-23 DIAGNOSIS — Z7902 Long term (current) use of antithrombotics/antiplatelets: Secondary | ICD-10-CM | POA: Insufficient documentation

## 2020-10-23 DIAGNOSIS — I200 Unstable angina: Secondary | ICD-10-CM

## 2020-10-23 DIAGNOSIS — Z888 Allergy status to other drugs, medicaments and biological substances status: Secondary | ICD-10-CM | POA: Insufficient documentation

## 2020-10-23 DIAGNOSIS — Z79899 Other long term (current) drug therapy: Secondary | ICD-10-CM | POA: Diagnosis not present

## 2020-10-23 HISTORY — PX: LEFT HEART CATH AND CORS/GRAFTS ANGIOGRAPHY: CATH118250

## 2020-10-23 HISTORY — PX: CORONARY STENT INTERVENTION: CATH118234

## 2020-10-23 LAB — GLUCOSE, CAPILLARY
Glucose-Capillary: 117 mg/dL — ABNORMAL HIGH (ref 70–99)
Glucose-Capillary: 173 mg/dL — ABNORMAL HIGH (ref 70–99)
Glucose-Capillary: 360 mg/dL — ABNORMAL HIGH (ref 70–99)
Glucose-Capillary: 320 mg/dL — ABNORMAL HIGH (ref 70–99)

## 2020-10-23 LAB — POCT ACTIVATED CLOTTING TIME
Activated Clotting Time: 249 seconds
Activated Clotting Time: 255 seconds
Activated Clotting Time: 267 seconds
Activated Clotting Time: 214 seconds
Activated Clotting Time: 148 seconds

## 2020-10-23 LAB — HEMOGLOBIN A1C
Hgb A1c MFr Bld: 7.5 % — ABNORMAL HIGH (ref 4.8–5.6)
Mean Plasma Glucose: 168.55 mg/dL

## 2020-10-23 SURGERY — LEFT HEART CATH AND CORS/GRAFTS ANGIOGRAPHY
Anesthesia: LOCAL

## 2020-10-23 MED ORDER — SODIUM CHLORIDE 0.9 % IV SOLN: INTRAVENOUS | Status: AC

## 2020-10-23 MED ORDER — FENTANYL CITRATE (PF) 100 MCG/2ML IJ SOLN
INTRAMUSCULAR | Status: DC | PRN
  Administered 2020-10-23 (×2): 25 ug via INTRAVENOUS

## 2020-10-23 MED ORDER — CLOPIDOGREL BISULFATE 75 MG PO TABS: 75.0000 mg | ORAL_TABLET | ORAL | Status: DC

## 2020-10-23 MED ORDER — EZETIMIBE 10 MG PO TABS
10.0000 mg | ORAL_TABLET | Freq: Every day | ORAL | Status: DC
  Administered 2020-10-23 – 2020-10-24 (×2): 10 mg via ORAL
  Filled 2020-10-23 (×2): qty 1

## 2020-10-23 MED ORDER — NITROGLYCERIN 1 MG/10 ML FOR IR/CATH LAB
INTRA_ARTERIAL | Status: DC | PRN
  Administered 2020-10-23 (×2): 200 ug via INTRACORONARY

## 2020-10-23 MED ORDER — ISOSORBIDE MONONITRATE ER 60 MG PO TB24
60.0000 mg | ORAL_TABLET | Freq: Every day | ORAL | Status: DC
  Administered 2020-10-23: 60 mg via ORAL
  Filled 2020-10-23: qty 1

## 2020-10-23 MED ORDER — SODIUM CHLORIDE 0.9% FLUSH
3.0000 mL | Freq: Two times a day (BID) | INTRAVENOUS | Status: DC
  Administered 2020-10-24 – 2020-10-25 (×4): 3 mL via INTRAVENOUS

## 2020-10-23 MED ORDER — SODIUM CHLORIDE 0.9% FLUSH
3.0000 mL | Freq: Two times a day (BID) | INTRAVENOUS | Status: DC
  Administered 2020-10-24 – 2020-10-25 (×2): 3 mL via INTRAVENOUS

## 2020-10-23 MED ORDER — LABETALOL HCL 5 MG/ML IV SOLN
10.0000 mg | INTRAVENOUS | Status: AC | PRN
  Administered 2020-10-23 (×2): 10 mg via INTRAVENOUS
  Filled 2020-10-23 (×2): qty 4

## 2020-10-23 MED ORDER — ISOSORBIDE MONONITRATE ER 60 MG PO TB24: 60.0000 mg | ORAL_TABLET | ORAL | Status: DC

## 2020-10-23 MED ORDER — ACETAMINOPHEN 325 MG PO TABS
650.0000 mg | ORAL_TABLET | ORAL | Status: DC | PRN
  Administered 2020-10-23 – 2020-10-24 (×3): 650 mg via ORAL
  Filled 2020-10-23 (×3): qty 2

## 2020-10-23 MED ORDER — ASPIRIN 81 MG PO CHEW: 81.0000 mg | CHEWABLE_TABLET | ORAL | Status: DC

## 2020-10-23 MED ORDER — CLOPIDOGREL BISULFATE 300 MG PO TABS
ORAL_TABLET | ORAL | Status: DC | PRN
  Administered 2020-10-23: 150 mg via ORAL

## 2020-10-23 MED ORDER — CLOPIDOGREL BISULFATE 75 MG PO TABS
ORAL_TABLET | ORAL | Status: AC
  Filled 2020-10-23: qty 2

## 2020-10-23 MED ORDER — SODIUM CHLORIDE 0.9 % WEIGHT BASED INFUSION
3.0000 mL/kg/h | INTRAVENOUS | Status: DC
  Administered 2020-10-23: 3 mL/kg/h via INTRAVENOUS

## 2020-10-23 MED ORDER — HEPARIN (PORCINE) IN NACL 1000-0.9 UT/500ML-% IV SOLN
INTRAVENOUS | Status: DC | PRN
  Administered 2020-10-23: 500 mL

## 2020-10-23 MED ORDER — HEPARIN SODIUM (PORCINE) 1000 UNIT/ML IJ SOLN
INTRAMUSCULAR | Status: AC
  Filled 2020-10-23: qty 1

## 2020-10-23 MED ORDER — SODIUM CHLORIDE 0.9 % IV SOLN: 250.0000 mL | INTRAVENOUS | Status: DC | PRN

## 2020-10-23 MED ORDER — IOHEXOL 350 MG/ML SOLN
INTRAVENOUS | Status: DC | PRN
  Administered 2020-10-23: 150 mL

## 2020-10-23 MED ORDER — FENOFIBRATE 160 MG PO TABS
160.0000 mg | ORAL_TABLET | Freq: Every day | ORAL | Status: DC
  Administered 2020-10-24 – 2020-10-25 (×2): 160 mg via ORAL
  Filled 2020-10-23 (×2): qty 1

## 2020-10-23 MED ORDER — IOHEXOL 350 MG/ML SOLN
INTRAVENOUS | Status: AC
  Filled 2020-10-23: qty 1

## 2020-10-23 MED ORDER — INSULIN ASPART 100 UNIT/ML ~~LOC~~ SOLN
0.0000 [IU] | Freq: Three times a day (TID) | SUBCUTANEOUS | Status: DC
  Administered 2020-10-24 (×2): 3 [IU] via SUBCUTANEOUS
  Administered 2020-10-25: 2 [IU] via SUBCUTANEOUS

## 2020-10-23 MED ORDER — SODIUM CHLORIDE 0.9 % WEIGHT BASED INFUSION: 1.0000 mL/kg/h | INTRAVENOUS | Status: DC

## 2020-10-23 MED ORDER — SODIUM CHLORIDE 0.9% FLUSH
3.0000 mL | INTRAVENOUS | Status: DC | PRN
Start: 2020-10-23 — End: 2020-10-25

## 2020-10-23 MED ORDER — AMLODIPINE BESYLATE 10 MG PO TABS
10.0000 mg | ORAL_TABLET | Freq: Every day | ORAL | Status: DC
  Administered 2020-10-24 – 2020-10-25 (×2): 10 mg via ORAL
  Filled 2020-10-23 (×2): qty 1

## 2020-10-23 MED ORDER — LIDOCAINE HCL (PF) 1 % IJ SOLN
INTRAMUSCULAR | Status: DC | PRN
  Administered 2020-10-23: 15 mL

## 2020-10-23 MED ORDER — BENAZEPRIL HCL 40 MG PO TABS
40.0000 mg | ORAL_TABLET | Freq: Every day | ORAL | Status: DC
  Administered 2020-10-24 – 2020-10-25 (×2): 40 mg via ORAL
  Filled 2020-10-23 (×2): qty 1

## 2020-10-23 MED ORDER — HEPARIN SODIUM (PORCINE) 1000 UNIT/ML IJ SOLN
INTRAMUSCULAR | Status: DC | PRN
  Administered 2020-10-23: 3000 [IU] via INTRAVENOUS
  Administered 2020-10-23: 7000 [IU] via INTRAVENOUS
  Administered 2020-10-23: 2500 [IU] via INTRAVENOUS

## 2020-10-23 MED ORDER — HEPARIN (PORCINE) IN NACL 1000-0.9 UT/500ML-% IV SOLN
INTRAVENOUS | Status: AC
  Filled 2020-10-23: qty 1000

## 2020-10-23 MED ORDER — METOPROLOL SUCCINATE ER 50 MG PO TB24
75.0000 mg | ORAL_TABLET | Freq: Every day | ORAL | Status: DC
  Administered 2020-10-24 – 2020-10-25 (×2): 75 mg via ORAL
  Filled 2020-10-23 (×2): qty 1

## 2020-10-23 MED ORDER — CLOPIDOGREL BISULFATE 75 MG PO TABS
75.0000 mg | ORAL_TABLET | Freq: Every day | ORAL | Status: DC
  Administered 2020-10-24 – 2020-10-25 (×2): 75 mg via ORAL
  Filled 2020-10-23 (×2): qty 1

## 2020-10-23 MED ORDER — MIDAZOLAM HCL 2 MG/2ML IJ SOLN
INTRAMUSCULAR | Status: AC
  Filled 2020-10-23: qty 2

## 2020-10-23 MED ORDER — PANTOPRAZOLE SODIUM 40 MG PO TBEC
40.0000 mg | DELAYED_RELEASE_TABLET | Freq: Every day | ORAL | Status: DC
  Administered 2020-10-24 – 2020-10-25 (×2): 40 mg via ORAL
  Filled 2020-10-23 (×2): qty 1

## 2020-10-23 MED ORDER — SODIUM CHLORIDE 0.9% FLUSH: 3.0000 mL | INTRAVENOUS | Status: DC | PRN

## 2020-10-23 MED ORDER — ONDANSETRON HCL 4 MG/2ML IJ SOLN: 4.0000 mg | Freq: Four times a day (QID) | INTRAMUSCULAR | Status: DC | PRN

## 2020-10-23 MED ORDER — LIDOCAINE HCL (PF) 1 % IJ SOLN
INTRAMUSCULAR | Status: AC
  Filled 2020-10-23: qty 30

## 2020-10-23 MED ORDER — FENTANYL CITRATE (PF) 100 MCG/2ML IJ SOLN
INTRAMUSCULAR | Status: AC
  Filled 2020-10-23: qty 2

## 2020-10-23 MED ORDER — ISOSORBIDE MONONITRATE ER 60 MG PO TB24: 90.0000 mg | ORAL_TABLET | Freq: Every day | ORAL | Status: DC

## 2020-10-23 MED ORDER — ASPIRIN 81 MG PO CHEW
81.0000 mg | CHEWABLE_TABLET | Freq: Every day | ORAL | Status: DC
  Administered 2020-10-24 – 2020-10-25 (×2): 81 mg via ORAL
  Filled 2020-10-23 (×2): qty 1

## 2020-10-23 MED ORDER — SULFAMETHOXAZOLE-TRIMETHOPRIM 800-160 MG PO TABS
1.0000 | ORAL_TABLET | Freq: Two times a day (BID) | ORAL | Status: DC
  Administered 2020-10-24 – 2020-10-25 (×4): 1 via ORAL
  Filled 2020-10-23 (×5): qty 1

## 2020-10-23 MED ORDER — NITROGLYCERIN 1 MG/10 ML FOR IR/CATH LAB
INTRA_ARTERIAL | Status: AC
  Filled 2020-10-23: qty 10

## 2020-10-23 MED ORDER — DIAZEPAM 5 MG PO TABS
5.0000 mg | ORAL_TABLET | Freq: Four times a day (QID) | ORAL | Status: DC | PRN
  Administered 2020-10-23: 5 mg via ORAL
  Filled 2020-10-23: qty 1

## 2020-10-23 MED ORDER — MIDAZOLAM HCL 2 MG/2ML IJ SOLN
INTRAMUSCULAR | Status: DC | PRN
  Administered 2020-10-23 (×3): 1 mg via INTRAVENOUS

## 2020-10-23 MED ORDER — HYDRALAZINE HCL 20 MG/ML IJ SOLN: 10.0000 mg | INTRAMUSCULAR | Status: AC | PRN

## 2020-10-23 SURGICAL SUPPLY — 19 items
BALLN SAPPHIRE 2.5X12 (BALLOONS) ×2
BALLOON SAPPHIRE 2.5X12 (BALLOONS) ×1 IMPLANT
CATH INFINITI 5 FR IM (CATHETERS) ×2 IMPLANT
CATH INFINITI 5FR MULTPACK ANG (CATHETERS) ×2 IMPLANT
CATH VISTA GUIDE 6FR JR4 (CATHETERS) ×2 IMPLANT
CATH VISTA GUIDE 6FR LCB (CATHETERS) ×2 IMPLANT
KIT ENCORE 26 ADVANTAGE (KITS) ×2 IMPLANT
KIT HEART LEFT (KITS) ×2 IMPLANT
PACK CARDIAC CATHETERIZATION (CUSTOM PROCEDURE TRAY) ×2 IMPLANT
SHEATH PINNACLE 5F 10CM (SHEATH) ×2 IMPLANT
SHEATH PINNACLE 6F 10CM (SHEATH) ×2 IMPLANT
SHEATH PROBE COVER 6X72 (BAG) ×2 IMPLANT
STENT SYNERGY XD 3.50X16 (Permanent Stent) ×1 IMPLANT
SYNERGY XD 3.50X16 (Permanent Stent) ×2 IMPLANT
TRANSDUCER W/STOPCOCK (MISCELLANEOUS) ×2 IMPLANT
TUBING CIL FLEX 10 FLL-RA (TUBING) ×2 IMPLANT
WIRE COUGAR XT STRL 190CM (WIRE) ×2 IMPLANT
WIRE EMERALD 3MM-J .035X150CM (WIRE) ×2 IMPLANT
WIRE EMERALD 3MM-J .035X260CM (WIRE) ×2 IMPLANT

## 2020-10-23 NOTE — Procedures (Signed)
The patient's genitals were prepped and draped in the routine sterile fashion.  A 22 French three-way Foley catheter was then gently inserted to the patient's urethral meatus into his bladder.  Dark bloody urine was returned.  Once the bladder was drained I began and irrigating the bladder with a 60 cc catheter tip syringe and ultimately used 2 L of saline.  I was able to get a significant amount of clot out of the patient's bladder.  After completing the irrigation the urine cleared quite significantly and was nearly completely translucent.  As such, I opted not to place the patient on continuous bladder irrigation.

## 2020-10-23 NOTE — Consult Note (Signed)
I have been asked to see the patient by Dr. Shelva Majestic, for evaluation and management of gross hematuria.  History of present illness: 87 year old man admitted to the cardiac service for unstable angina and is now status post coronary artery stent placement.  While in the Cath Lab the patient had a Foley catheter placed.  He had significant gross hematuria at that time and was having crampy suprapubic pain.  The catheter was noted not to be draining well at all and was ultimately removed.  Following this, the patient was able to void very frequent small amounts, but at times felt as if he was unable to urinate at all.  He reported passing significant clots.  Urology was consulted for further evaluation and management.  The patient has a urologic history significant for BPH and urinary retention.  Last fall he had a UroLift procedure which consist of pinning back the prostatic lobes in order to create a bigger urethral channel.  This was complicated by recurrent urinary tract infections, and currently the patient is taking Bactrim to minimize his infections.  The patient states that while on the Bactrim he voids okay, but as soon as he comes off he has difficulty.  The patient reports having difficulty urinating almost all his life.  Review of systems: A 12 point comprehensive review of systems was obtained and is negative unless otherwise stated in the history of present illness.  Patient Active Problem List   Diagnosis Date Noted  . Coronary artery disease involving native coronary artery of native heart with unstable angina pectoris ([place]) 10/23/2020  . Unstable angina ([place])   . Systolic murmur of aorta 05/39/7673  . Coronary artery disease due to lipid rich plaque   . H/O class III angina pectoris 07/31/2014  . Hyperlipidemia LDL goal <70 07/31/2014  . Statin intolerance 07/31/2014  . GERD (gastroesophageal reflux disease) 01/23/2014  . S/P angioplasty with stent, to VG -diag 1, LAD and septal  perferator vessels 06/25/12 06/25/2012  . BPH, symptoms 06/19/2012  . NSTEMI (non-ST elevated myocardial infarction), secondary to ulcerated plaque in VG to diag.  treating medically 06/18/2012  . CABG X 4, LIMA-LAD, SVG-Dx, SVG-OM-CFX, SVG-RCA 1999, now with disease in VG to diag 1, and prox LAD, associated with NSTEMI 06/17/12 06/18/2012  . Hypertension 06/18/2012  . Diabetes mellitus, Type NIDDM 06/18/2012  . Hyperlipidemia, statin intol with a history myositis  06/18/2012    No current facility-administered medications on file prior to encounter.   Current Outpatient Medications on File Prior to Encounter  Medication Sig Dispense Refill  . amLODipine (NORVASC) 10 MG tablet TAKE 1 TABLET BY MOUTH  DAILY (Patient taking differently: Take 10 mg by mouth daily.) 90 tablet 3  . aspirin 81 MG tablet Take 81 mg by mouth daily.    . benazepril (LOTENSIN) 40 MG tablet TAKE 1 TABLET BY MOUTH  DAILY (Patient taking differently: Take 40 mg by mouth daily.) 90 tablet 3  . clopidogrel (PLAVIX) 75 MG tablet TAKE 1 TABLET BY MOUTH  DAILY (Patient taking differently: Take 75 mg by mouth daily.) 90 tablet 3  . ezetimibe (ZETIA) 10 MG tablet TAKE 1 TABLET (10 MG TOTAL) BY MOUTH AT BEDTIME 90 tablet 3  . fenofibrate 160 MG tablet TAKE 1 TABLET BY MOUTH  DAILY (Patient taking differently: Take 160 mg by mouth daily.) 30 tablet 11  . folic acid (FOLVITE) 1 MG tablet Take one tablet by mouth daily (Patient taking differently: Take 1 mg by mouth daily.) 90  tablet 1  . Glycerin-Hypromellose-PEG 400 (DRY EYE RELIEF DROPS) 0.2-0.2-1 % SOLN Place 1 drop into both eyes daily.    . hydrochlorothiazide (MICROZIDE) 12.5 MG capsule Take 1 capsule (12.5 mg total) by mouth as needed (swelling). (Patient taking differently: Take 12.5 mg by mouth daily as needed (swelling).) 90 capsule 3  . isosorbide mononitrate (IMDUR) 60 MG 24 hr tablet Take 90 mg(1 1/2 tablet) in the morning and 60 mg (1 tablet) at bedtime (Patient taking  differently: Take 60-90 mg by mouth See admin instructions. Take 90 mg in the morning and 60 mg at bedtime) 135 tablet 3  . metFORMIN (GLUCOPHAGE) 500 MG tablet Take 500 mg by mouth 2 (two) times daily with a meal.     . metoprolol succinate (TOPROL-XL) 50 MG 24 hr tablet TAKE 1 AND 1/2 TABLETS BY  MOUTH ONCE DAILY TAKE WITH  OR IMMEDIATELY FOLLOWING A  MEAL (Patient taking differently: Take 75 mg by mouth daily.) 135 tablet 3  . Multiple Vitamins-Minerals (EYE VITAMINS PO) Take 1 tablet by mouth every evening.    . nitroGLYCERIN (NITROSTAT) 0.4 MG SL tablet Place 1 tablet under the tongue every 5 minutes as needed for chest pain (Patient taking differently: Place 0.4 mg under the tongue every 5 (five) minutes as needed for chest pain.) 25 tablet 3  . pantoprazole (PROTONIX) 40 MG tablet Take 40 mg by mouth daily.    Marland Kitchen sulfamethoxazole-trimethoprim (BACTRIM DS) 800-160 MG tablet Take 1 tablet by mouth 2 (two) times daily.      Past Medical History:  Diagnosis Date  . Anginal pain ([place])   . Arthritis    "mostly in my hands" (06/25/2012)  . CAD (coronary artery disease) 03/25/2007   30 d monitor - sinus rhythm w/ some PACs  . DM type 2 (diabetes mellitus, type 2) ([place])   . GERD (gastroesophageal reflux disease)   . H/O hiatal hernia   . Hyperlipidemia   . Hypertension   . NSTEMI (non-ST elevated myocardial infarction) ([place]) 06/18/2012  . S/P angioplasty with stent, to VG -diag 1, LAD and septal perferator vessels  06/25/2012    Past Surgical History:  Procedure Laterality Date  . CARDIAC CATHETERIZATION  06/25/2012   3.5 x 48mm Xience Xpedition DES inserted in distal third of LAD  . CARDIOVASCULAR STRESS TEST  11/05/2010   R/S MV - EF 73%; post stress LV fcn normal, no significant wall motion abnormalities; perfusion defect in inferior myocardial region consistent w/ diaphragmatic attenuation, remaining myocardium demonstrates normal perfusion; Exercise capacity 10 METS; stress EKG showed  changes from baseline EKG; study results unchanged/within normal variance of last study (09/2008)  . CERVICAL DISC SURGERY  1990's  . CORONARY ANGIOPLASTY WITH STENT PLACEMENT  06/25/2012   "1; first one for me" (06/25/2012)  . CORONARY ARTERY BYPASS GRAFT  10/1997   CABG X 4 LIMA to LAD; vein to diagonal; sequential vein to obtuse marginal and distal circumflex and vein to RCA  . CORONARY STENT INTERVENTION  10/23/2020  . DOPPLER ECHOCARDIOGRAPHY  09/23/2011   EF >55%; proximal septal thickening noted; mild septal hypokinesis; ; mild/mod tricuspid regurgitation; aortic root sclerosis/calcification  . LEFT HEART CATHETERIZATION WITH CORONARY/GRAFT ANGIOGRAM N/A 06/21/2012   Procedure: LEFT HEART CATHETERIZATION WITH Beatrix Fetters;  Surgeon: Sanda Klein, MD;  Location: [facility] CATH LAB;  Service: Cardiovascular;  Laterality: N/A;  . LEFT HEART CATHETERIZATION WITH CORONARY/GRAFT ANGIOGRAM N/A 08/01/2014   Procedure: LEFT HEART CATHETERIZATION WITH Beatrix Fetters;  Surgeon: Troy Sine, MD;  Location: [facility] CATH LAB;  Service: Cardiovascular;  Laterality: N/A;  . PERCUTANEOUS CORONARY STENT INTERVENTION (PCI-S) N/A 06/25/2012   Procedure: PERCUTANEOUS CORONARY STENT INTERVENTION (PCI-S);  Surgeon: Troy Sine, MD;  Location: [facility] CATH LAB;  Service: Cardiovascular;  Laterality: N/A;  . TONSILLECTOMY AND ADENOIDECTOMY     "when I was a kid" (06/25/2012)    Social History   Tobacco Use  . Smoking status: Former Smoker    Packs/day: 0.75    Years: 20.00    Pack years: 15.00  . Smokeless tobacco: Never Used  . Tobacco comment: 06/25/2012 "quit smoking cigarettes ~ 40 yr ago"  Vaping Use  . Vaping Use: Never used  Substance Use Topics  . Alcohol use: No    Alcohol/week: 0.0 standard drinks  . Drug use: No    Family History  Problem Relation Age of Onset  . Coronary artery disease Father   . Hypertension Father   . Cancer - Colon Father   . Hypertension Mother   .  Coronary artery disease Brother   . Coronary artery disease Paternal Grandfather   . Diabetes Brother   . Cancer - Other Brother        Melanoma  . Cancer - Other Brother        Renal Cell     PE: Vitals:   10/23/20 1200 10/23/20 1300 10/23/20 1400 10/23/20 2100  BP: (!) 259/92 (!) 163/57 (!) 96/36 (!) 164/69  Pulse: 92 81 61 92  Resp: (!) 24 18 16 20   Temp:    98.3 F (36.8 C)  TempSrc:    Oral  SpO2: 96% 95% 97% 99%  Weight:      Height:       Patient appears to be in no acute distress  patient is alert and oriented x3 Atraumatic normocephalic head No cervical or supraclavicular lymphadenopathy appreciated No increased work of breathing, no audible wheezes/rhonchi Regular sinus rhythm/rate Abdomen is soft, nontender, nondistended, no CVA or suprapubic tenderness Blood per urethral meatus, suprapubic tenderness Lower extremities are symmetric without appreciable edema Grossly neurologically intact No identifiable skin lesions  No results for input(s): WBC, HGB, HCT in the last 72 hours. No results for input(s): NA, K, CL, CO2, GLUCOSE, BUN, CREATININE, CALCIUM in the last 72 hours. No results for input(s): LABPT, INR in the last 72 hours. No results for input(s): LABURIN in the last 72 hours. Results for orders placed or performed during the hospital encounter of 10/19/20  SARS CORONAVIRUS 2 (TAT 6-24 HRS) Nasopharyngeal Nasopharyngeal Swab     Status: None   Collection Time: 10/19/20  2:14 PM   Specimen: Nasopharyngeal Swab  Result Value Ref Range Status   SARS Coronavirus 2 NEGATIVE NEGATIVE Final    Comment: (NOTE) SARS-CoV-2 target nucleic acids are NOT DETECTED.  The SARS-CoV-2 RNA is generally detectable in upper and lower respiratory specimens during the acute phase of infection. Negative results do not preclude SARS-CoV-2 infection, do not rule out co-infections with other pathogens, and should not be used as the sole basis for treatment or other patient  management decisions. Negative results must be combined with clinical observations, patient history, and epidemiological information. The expected result is Negative.  Fact Sheet for Patients: SugarRoll.be  Fact Sheet for Healthcare Providers: https://www.woods-mathews.com/  This test is not yet approved or cleared by the [place] FDA and  has been authorized for detection and/or diagnosis of SARS-CoV-2 by FDA under an Emergency Use Authorization (EUA). This EUA will remain  in effect (meaning this test can be used) for the duration of the COVID-19 declaration under Se ction 564(b)(1) of the Act, 21 U.S.C. section 360bbb-3(b)(1), unless the authorization is terminated or revoked sooner.  Performed at [facility], [place] [address], [place], [place] [address]     Imaging: none  Imp: The patient had gross hematuria and partial clot retention.  He was voiding, although very small amounts intermittently secondary to blood clots within the bladder.  Recommendations: A 22 French three-way Foley catheter was placed in the patient's bladder and I hand irrigated him with 2 L of normal saline and extracted all the clots that I could.  Once I completed this process the urine was significantly more clear.  As result of this, I did not start the patient on continuous bladder irrigation.  If the patient's urine remains clear, I would recommend that the Foley catheter be removed prior to discharge.  He was not in frank retention, and should be able to void once the catheter was removed.  The patient has an established relationship with a urologist in [facility], and I recommended that he follow-up closely with this doctor.  I am also happy to see the patient in our office if he so chooses, and I will leave my information on his discharge summary.   Thank you for involving me in this patient's care, please page with any further questions or  concerns. Ardis Hughs

## 2020-10-23 NOTE — Interval H&P Note (Signed)
Cath Lab Visit (complete for each Cath Lab visit)  Clinical Evaluation Leading to the Procedure:   ACS: No.  Non-ACS:    Anginal Classification: CCS III  Anti-ischemic medical therapy: Maximal Therapy (2 or more classes of medications)  Non-Invasive Test Results: No non-invasive testing performed  Prior CABG: Previous CABG      History and Physical Interval Note:  10/23/2020 7:39 AM  Daryl Joseph  has presented today for surgery, with the diagnosis of angina.  The various methods of treatment have been discussed with the patient and family. After consideration of risks, benefits and other options for treatment, the patient has consented to  Procedure(s): LEFT HEART CATH AND CORS/GRAFTS ANGIOGRAPHY (N/A) as a surgical intervention.  The patient's history has been reviewed, patient examined, no change in status, stable for surgery.  I have reviewed the patient's chart and labs.  Questions were answered to the patient's satisfaction.     Shelva Majestic

## 2020-10-23 NOTE — Progress Notes (Signed)
Patient received from cath lab with a 44fr foley inserted. Noted bloody urine in bag and tubing. Patient increasingly complaining of the feeling to void and pain. Bladder scan done at 1700 and 383 mls of urine scanned. Orders obtained for irrigation of foley and foley irrigated and bloody urine and clot removed. Patient continued to complain of discomfort and patient and wife both requested that foley cath removed.  1835 Foley cath removed and patient assisted to the bathroom. Stated if he sits on the toilet for a while he can usually void.  Patient reported passing 2-3 clots then clear urine. Stream was the thin as a toothpick. Reported that the stream was much better prior to admission to the hospital today.  Above information relayed to Almyra Deforest PA in real time and he has requested a urologist see patient tonight. Patient and wife notified that urologist was consulted.

## 2020-10-23 NOTE — Progress Notes (Signed)
Site area: right groin  Site Prior to Removal:  Level 0  Pressure Applied For 20  MINUTES    Minutes Beginning at 1340   Manual:   Yes.    Patient Status During Pull:  Stable  Post Pull Groin Site:  Level 0  Post Pull Instructions Given:  Yes.    Post Pull Pulses Present:  Yes.    Dressing Applied:  Yes.    Comments:  Bed rest started at 1400 X 4 hr.

## 2020-10-24 ENCOUNTER — Encounter (HOSPITAL_COMMUNITY): Payer: Self-pay | Admitting: Cardiovascular Disease

## 2020-10-24 DIAGNOSIS — Z951 Presence of aortocoronary bypass graft: Secondary | ICD-10-CM | POA: Diagnosis not present

## 2020-10-24 DIAGNOSIS — I25110 Atherosclerotic heart disease of native coronary artery with unstable angina pectoris: Secondary | ICD-10-CM | POA: Diagnosis not present

## 2020-10-24 DIAGNOSIS — I2582 Chronic total occlusion of coronary artery: Secondary | ICD-10-CM | POA: Diagnosis not present

## 2020-10-24 DIAGNOSIS — R319 Hematuria, unspecified: Secondary | ICD-10-CM

## 2020-10-24 DIAGNOSIS — I200 Unstable angina: Secondary | ICD-10-CM

## 2020-10-24 DIAGNOSIS — I10 Essential (primary) hypertension: Secondary | ICD-10-CM | POA: Diagnosis not present

## 2020-10-24 LAB — CBC
HCT: 33 % — ABNORMAL LOW (ref 39.0–52.0)
Hemoglobin: 11.5 g/dL — ABNORMAL LOW (ref 13.0–17.0)
MCH: 30 pg (ref 26.0–34.0)
MCHC: 34.8 g/dL (ref 30.0–36.0)
MCV: 86.2 fL (ref 80.0–100.0)
Platelets: 181 10*3/uL (ref 150–400)
RBC: 3.83 MIL/uL — ABNORMAL LOW (ref 4.22–5.81)
RDW: 14.9 % (ref 11.5–15.5)
WBC: 8.9 10*3/uL (ref 4.0–10.5)
nRBC: 0 % (ref 0.0–0.2)

## 2020-10-24 LAB — GLUCOSE, CAPILLARY
Glucose-Capillary: 117 mg/dL — ABNORMAL HIGH (ref 70–99)
Glucose-Capillary: 160 mg/dL — ABNORMAL HIGH (ref 70–99)
Glucose-Capillary: 184 mg/dL — ABNORMAL HIGH (ref 70–99)
Glucose-Capillary: 195 mg/dL — ABNORMAL HIGH (ref 70–99)

## 2020-10-24 LAB — BASIC METABOLIC PANEL
Anion gap: 6 (ref 5–15)
BUN: 15 mg/dL (ref 8–23)
CO2: 25 mmol/L (ref 22–32)
Calcium: 8.9 mg/dL (ref 8.9–10.3)
Chloride: 110 mmol/L (ref 98–111)
Creatinine, Ser: 0.96 mg/dL (ref 0.61–1.24)
GFR, Estimated: >60 mL/min (ref >60)
Glucose, Bld: 161 mg/dL — ABNORMAL HIGH (ref 70–99)
Potassium: 3.6 mmol/L (ref 3.5–5.1)
Sodium: 141 mmol/L (ref 135–145)

## 2020-10-24 MED ORDER — CHLORHEXIDINE GLUCONATE CLOTH 2 % EX PADS
6.0000 | MEDICATED_PAD | Freq: Every day | CUTANEOUS | Status: DC
  Administered 2020-10-25: 6 via TOPICAL

## 2020-10-24 MED ORDER — ISOSORBIDE MONONITRATE ER 60 MG PO TB24
150.0000 mg | ORAL_TABLET | Freq: Every day | ORAL | Status: DC
  Administered 2020-10-24 – 2020-10-25 (×2): 150 mg via ORAL
  Filled 2020-10-24 (×3): qty 1

## 2020-10-24 MED FILL — Clopidogrel Bisulfate Tab 75 MG (Base Equiv): ORAL | Qty: 2 | Status: AC

## 2020-10-24 NOTE — Discharge Summary (Incomplete)
Discharge Summary    Patient ID: CARMINO OCAIN MRN: [id number]; DOB: 20-May-1933  Admit date: 10/23/2020 Discharge date: 10/24/2020  PCP:  Hall, Lisa M, [place]  Cardiologist:  Shelva Majestic, MD *** Advanced Practice Provider:  No care team member to display Electrophysiologist:  None  {Press F2 to show EP APP, CHF, sleep or structural heart MD               :[id number]}  { Click here to update Patient Care Team and Refresh Note - MD (PCP) or APP (Team Member)  Change PCP Type for MD, Specialty for APP is either Cardiology or Clinical Cardiac Electrophysiology  :[id number]}    Discharge Diagnoses    Active Problems:   Unstable angina [facility])   Coronary artery disease involving native coronary artery of native heart with unstable angina pectoris ([place])    Diagnostic Studies/Procedures    *** _____________   History of Present Illness     Daryl Joseph is a 87 y.o. male with ***  Hospital Course     Consultants: ***   ***  Did the patient have an acute coronary syndrome (MI, NSTEMI, STEMI, etc) this admission?:  {Press F2   :[id number]}   {Are you ordering a CV Procedure (e.g. stress test, cath, DCCV, TEE, etc) to be done as an outpatient?   Press F2        :[id number]}  _____________  Discharge Vitals Blood pressure (!) 153/73, pulse 83, temperature 98.5 F (36.9 C), temperature source Axillary, resp. rate 16, height 5\' 10"  (1.778 m), weight 70.3 kg, SpO2 100 %.  Filed Weights   10/23/20 0544  Weight: 70.3 kg    Labs & Radiologic Studies    CBC Recent Labs    10/24/20 0230  WBC 8.9  HGB 11.5*  HCT 33.0*  MCV 86.2  PLT 244   Basic Metabolic Panel Recent Labs    10/24/20 0230  NA 141  K 3.6  CL 110  CO2 25  GLUCOSE 161*  BUN 15  CREATININE 0.96  CALCIUM 8.9   Liver Function Tests No results for input(s): AST, ALT, ALKPHOS, BILITOT, PROT, ALBUMIN in the last 72 hours. No results for input(s): LIPASE, AMYLASE in  the last 72 hours. High Sensitivity Troponin:   Recent Labs  Lab 10/08/20 1632 10/08/20 1832  TROPONINIHS 6 6    BNP Invalid input(s): POCBNP D-Dimer No results for input(s): DDIMER in the last 72 hours. Hemoglobin A1C Recent Labs    10/23/20 1952  HGBA1C 7.5*   Fasting Lipid Panel No results for input(s): CHOL, HDL, LDLCALC, TRIG, CHOLHDL, LDLDIRECT in the last 72 hours. Thyroid Function Tests No results for input(s): TSH, T4TOTAL, T3FREE, THYROIDAB in the last 72 hours.  Invalid input(s): FREET3 _____________  DG Chest 2 View  Result Date: 10/08/2020 CLINICAL DATA:  Chest pain radiating into the left shoulder EXAM: CHEST - 2 VIEW COMPARISON:  06/19/2012 FINDINGS: Cardiac shadow is stable. Postsurgical changes are again seen. Aortic calcifications are noted. Lungs are well aerated bilaterally. No focal infiltrate or effusion is seen. Degenerative changes of the thoracic spine are noted. Extrinsic leads are noted over the chest. Bilateral nipple shadows are again seen. IMPRESSION: No acute abnormality noted. Electronically Signed   By: Inez Catalina M.D.   On: 10/08/2020 17:25   CARDIAC CATHETERIZATION  Result Date: 10/23/2020  Ost LAD to Prox LAD lesion is 100% stenosed.  Mid LM to Dist LM lesion is  90% stenosed.  Ramus lesion is 80% stenosed.  Prox Graft lesion before 1st Mrg is 25% stenosed.  Prox Graft to Mid Graft lesion between 1st Mrg and 2nd Mrg is 75% stenosed.  Previously placed Mid Graft to Insertion stent (unknown type) is widely patent.  Origin to Prox Graft lesion is 90% stenosed.  Post intervention, there is a 0% residual stenosis.  Prox RCA lesion is 90% stenosed.  Mid RCA-1 lesion is 95% stenosed.  Mid RCA-2 lesion is 99% stenosed.  Mid RCA-3 lesion is 100% stenosed.  The left ventricular systolic function is normal.  LV end diastolic pressure is normal.  The left ventricular ejection fraction is 55-65% by visual estimate.  Ost Cx to Prox Cx lesion is 50%  stenosed.  A stent was successfully placed.  Severe multivessel native CAD with 95% distal left main stenosis; total ostial occlusion of the LAD; diffuse 80% stenosis in a small caliber ramus intermediate vessel;  40 to 50% proximal circumflex stenoses filling to the marginal vessels; and severe diffuse proximal to mid RCA disease with subtotal/total distal occlusion. Patent LIMA graft supplying the LAD. Sequential vein graft supplying the OM1 and distal circumflex with mild 20% focal narrowing in the proximal third of the graft and 75% eccentric stenosis in the sequential limb of the graft immediately after the OM1 vessel. SVG supplying the diagonal vessel with a widely patent previously placed mid distal stent.   There is a new 90% smooth focal stenosis in the proximal body of the graft with TIMI-3 flow. Patent SVG supplying the distal RCA. Normal LV contractility without focal segmental wall motion abnormalities.  LVEDP 16 mm. Successful PCI to the proximal vein graft supplying the diagonal vessel with insertion of a 3.5 x 16 mm Synergy DES stent dilated up to 3.74 mm with the stenosis being reduced to 0%. RECOMMENDATION: Continue long-term DAPT indefinitely.  Medical therapy for concomitant CAD.  If he develops recurrent increasing anginal symptomatology, consider future intervention to the sequential limb of the vein graft supplying the circumflex vessel.  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  Continue nonstatin therapy for aggressive lipid-lowering therapy.   Disposition   Pt is being discharged home today in good condition.  Follow-up Plans & Appointments     Follow-up Information    Ardis Hughs, MD. Call.   Specialty: Urology Contact information: [place] [address] [phone]              Discharge Instructions    Amb Referral to Cardiac Rehabilitation   Complete by: As directed    Diagnosis: Coronary  Stents   After initial evaluation and assessments completed: Virtual Based Care may be provided alone or in conjunction with Phase 2 Cardiac Rehab based on patient barriers.: Yes      Discharge Medications   Allergies as of 10/24/2020      Reactions   Statins Other (See Comments)   Myositis with statins "affected my liver; I almost had Hepatitis" (06/25/2012)   Welchol [colesevelam] Other (See Comments)   unknown    Med Rec must be completed prior to using this Echo***          Outstanding Labs/Studies   ***  Duration of Discharge Encounter   Greater than 30 minutes including physician time.  Signed, Reino Bellis, NP 10/24/2020, 1:45 PM

## 2020-10-24 NOTE — Progress Notes (Signed)
CARDIAC REHAB PHASE I   PRE:  Rate/Rhythm: 81 SR  BP:  Supine: 149/62     SaO2: 95 RA  MODE:  Ambulation: 470 ft   POST:  Rate/Rhythm: 95 SR  BP:  Sitting: 189/78     SaO2: 95 RA  Pt tolerated exercise well and amb 470 with a walker and no CP SOB or pain. Pt used the restroom upon return and some blood was leaking around the catheter site, nurse aware. Education given to pt and wife on femoral restrictions, diabetic diet, heart healthy diet and adherence to Brilinta, NTG and aspirin. Given exercise guidelines and will refer to cardiac rehab in [place]. Pt left in recliner with bed alarm on in chair and call bell in reach.   [phone] Kirby Funk ACSM-EP 10/24/2020 11:32 AM

## 2020-10-24 NOTE — Progress Notes (Signed)
    Followed up with patient this afternoon, since removing foley he has had about 100cc blood tinged urine output. At the time of reassessment he was attempting to use the urinal but unable to void at all. Has the sensation of need to urinate. Discussed with wife and patient the need to remain inpatient and replace foley with irrigation. Reached out to urology regarding patient status as we may need their further assistance while inpatient prior to discharge. Attending updated as well on the need to keep overnight.   Barnet Pall, NP-C 10/24/2020, 3:37 PM Pager: [phone]

## 2020-10-24 NOTE — Progress Notes (Signed)
The patient's urine has cleared and remained clear overnight.  He has passed some small little clots, but for the most part his urine remains clear.  The patient is unwilling to go home with a catheter.  As such, I recommended that the catheter be removed this morning and then his urine be evaluated with each void to ensure that it is not getting more bloody throughout the day.  If it remains stable he can go home without the catheter.

## 2020-10-24 NOTE — Progress Notes (Addendum)
The patient has been seen in conjunction with Harlan Stains, NP. All aspects of care have been considered and discussed. The patient has been personally interviewed, examined, and all clinical data has been reviewed.   Digital images from PCI of SVG to diagonal reviewed.  No evidence of bleeding from the cath site.  Agree with concept of medical management.  Add bleeding from bladder requiring Foley catheter placement.  Still with some blood in Foley catheter this a.m.  The patient and Dr. Louis Meckel decided to pull the Foley.  If he urinates and does not have recurrence of progressive hematuria, he will be able to discharge later today or tomorrow.  We will follow.   Progress Note  Patient Name: Daryl Joseph Date of Encounter: 10/24/2020  [facility] Cardiologist: Shelva Majestic, MD   Subjective   Eating Breakfast, no chest pain  Having on left shoulder burning/pain. Similar to what brought him in for cath.   Urinary symptoms significantly improved.   Inpatient Medications    Scheduled Meds: . amLODipine  10 mg Oral Daily  . aspirin  81 mg Oral Daily  . benazepril  40 mg Oral Daily  . Chlorhexidine Gluconate Cloth  6 each Topical Daily  . clopidogrel  75 mg Oral Q breakfast  . ezetimibe  10 mg Oral QHS  . fenofibrate  160 mg Oral Daily  . insulin aspart  0-15 Units Subcutaneous TID WC  . isosorbide mononitrate  90 mg Oral Daily   And  . isosorbide mononitrate  60 mg Oral QHS  . metoprolol succinate  75 mg Oral Daily  . pantoprazole  40 mg Oral Daily  . sodium chloride flush  3 mL Intravenous Q12H  . sodium chloride flush  3 mL Intravenous Q12H  . sulfamethoxazole-trimethoprim  1 tablet Oral BID   Continuous Infusions: . sodium chloride     PRN Meds: sodium chloride, acetaminophen, diazepam, ondansetron (ZOFRAN) IV, sodium chloride flush   Vital Signs    Vitals:   10/23/20 1400 10/23/20 2100 10/24/20 0038 10/24/20 0500  BP: (!) 96/36 (!) 164/69 (!)  158/70 135/75  Pulse: 61 92  74  Resp: 16 20 15 20   Temp:  98.3 F (36.8 C) 98.5 F (36.9 C) 98.5 F (36.9 C)  TempSrc:  Oral Oral Oral  SpO2: 97% 99% 99% 100%  Weight:      Height:        Intake/Output Summary (Last 24 hours) at 10/24/2020 0808 Last data filed at 10/24/2020 0008 Gross per 24 hour  Intake 893 ml  Output 2500 ml  Net -1607 ml   Last 3 Weights 10/23/2020 10/17/2020 03/01/2020  Weight (lbs) 155 lb 155 lb 9.6 oz 155 lb 6.4 oz  Weight (kg) 70.308 kg 70.58 kg 70.489 kg      Telemetry    NSR - Personally Reviewed  ECG    SR 1st degree AVB, nonspecific changes - Personally Reviewed  Physical Exam   GEN: No acute distress.   Neck: No JVD Cardiac: RRR, + systolic murmur, no rubs, or gallops.  Respiratory: Clear to auscultation bilaterally. GI: Soft, nontender, non-distended. Foley in place MS: No edema; No deformity. Right femoral cath site stable. Neuro:  Nonfocal  Psych: Normal affect   Labs    High Sensitivity Troponin:   Recent Labs  Lab 10/08/20 1632 10/08/20 1832  TROPONINIHS 6 6      Chemistry Recent Labs  Lab 10/19/20 1240 10/24/20 0230  NA 142 141  K  4.7 3.6  CL 105 110  CO2 23 25  GLUCOSE 158* 161*  BUN 15 15  CREATININE 1.08 0.96  CALCIUM 9.8 8.9  GFRNONAA  --  >60  ANIONGAP  --  6     Hematology Recent Labs  Lab 10/19/20 1240 10/24/20 0230  WBC 5.5 8.9  RBC 4.60 3.83*  HGB 13.7 11.5*  HCT 41.0 33.0*  MCV 89 86.2  MCH 29.8 30.0  MCHC 33.4 34.8  RDW 13.9 14.9  PLT 192 181    BNPNo results for input(s): BNP, PROBNP in the last 168 hours.   DDimer No results for input(s): DDIMER in the last 168 hours.   Radiology    CARDIAC CATHETERIZATION  Result Date: 10/23/2020  Ost LAD to Prox LAD lesion is 100% stenosed.  Mid LM to Dist LM lesion is 90% stenosed.  Ramus lesion is 80% stenosed.  Prox Graft lesion before 1st Mrg is 25% stenosed.  Prox Graft to Mid Graft lesion between 1st Mrg and 2nd Mrg is 75% stenosed.   Previously placed Mid Graft to Insertion stent (unknown type) is widely patent.  Origin to Prox Graft lesion is 90% stenosed.  Post intervention, there is a 0% residual stenosis.  Prox RCA lesion is 90% stenosed.  Mid RCA-1 lesion is 95% stenosed.  Mid RCA-2 lesion is 99% stenosed.  Mid RCA-3 lesion is 100% stenosed.  The left ventricular systolic function is normal.  LV end diastolic pressure is normal.  The left ventricular ejection fraction is 55-65% by visual estimate.  Ost Cx to Prox Cx lesion is 50% stenosed.  A stent was successfully placed.  Severe multivessel native CAD with 95% distal left main stenosis; total ostial occlusion of the LAD; diffuse 80% stenosis in a small caliber ramus intermediate vessel;  40 to 50% proximal circumflex stenoses filling to the marginal vessels; and severe diffuse proximal to mid RCA disease with subtotal/total distal occlusion. Patent LIMA graft supplying the LAD. Sequential vein graft supplying the OM1 and distal circumflex with mild 20% focal narrowing in the proximal third of the graft and 75% eccentric stenosis in the sequential limb of the graft immediately after the OM1 vessel. SVG supplying the diagonal vessel with a widely patent previously placed mid distal stent.   There is a new 90% smooth focal stenosis in the proximal body of the graft with TIMI-3 flow. Patent SVG supplying the distal RCA. Normal LV contractility without focal segmental wall motion abnormalities.  LVEDP 16 mm. Successful PCI to the proximal vein graft supplying the diagonal vessel with insertion of a 3.5 x 16 mm Synergy DES stent dilated up to 3.74 mm with the stenosis being reduced to 0%. RECOMMENDATION: Continue long-term DAPT indefinitely.  Medical therapy for concomitant CAD.  If he develops recurrent increasing anginal symptomatology, consider future intervention to the sequential limb of the vein graft supplying the circumflex vessel.  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  Continue nonstatin therapy for aggressive lipid-lowering therapy.    Cardiac Studies   Cath: 10/23/20   Ost LAD to Prox LAD lesion is 100% stenosed.  Mid LM to Dist LM lesion is 90% stenosed.  Ramus lesion is 80% stenosed.  Prox Graft lesion before 1st Mrg is 25% stenosed.  Prox Graft to Mid Graft lesion between 1st Mrg and 2nd Mrg is 75% stenosed.  Previously placed Mid Graft to Insertion stent (unknown type) is widely patent.  Origin to Prox Graft  lesion is 90% stenosed.  Post intervention, there is a 0% residual stenosis.  Prox RCA lesion is 90% stenosed.  Mid RCA-1 lesion is 95% stenosed.  Mid RCA-2 lesion is 99% stenosed.  Mid RCA-3 lesion is 100% stenosed.  The left ventricular systolic function is normal.  LV end diastolic pressure is normal.  The left ventricular ejection fraction is 55-65% by visual estimate.  Ost Cx to Prox Cx lesion is 50% stenosed.  A stent was successfully placed.   Severe multivessel native CAD with 95% distal left main stenosis; total ostial occlusion of the LAD; diffuse 80% stenosis in a small caliber ramus intermediate vessel;  40 to 50% proximal circumflex stenoses filling to the marginal vessels; and severe diffuse proximal to mid RCA disease with subtotal/total distal occlusion.  Patent LIMA graft supplying the LAD.  Sequential vein graft supplying the OM1 and distal circumflex with mild 20% focal narrowing in the proximal third of the graft and 75% eccentric stenosis in the sequential limb of the graft immediately after the OM1 vessel.  SVG supplying the diagonal vessel with a widely patent previously placed mid distal stent.   There is a new 90% smooth focal stenosis in the proximal body of the graft with TIMI-3 flow.  Patent SVG supplying the distal RCA.  Normal LV contractility without focal segmental wall motion abnormalities.  LVEDP 16 mm.  Successful PCI to  the proximal vein graft supplying the diagonal vessel with insertion of a 3.5 x 16 mm Synergy DES stent dilated up to 3.74 mm with the stenosis being reduced to 0%.  RECOMMENDATION: Continue long-term DAPT indefinitely.  Medical therapy for concomitant CAD.  If he develops recurrent increasing anginal symptomatology, consider future intervention to the sequential limb of the vein graft supplying the circumflex vessel.  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  Continue nonstatin therapy for aggressive lipid-lowering therapy.  Diagnostic Dominance: Right    Intervention      Patient Profile     87 y.o. male with PMH of CAD s/p CABG '99 with subsequent PCI, HLD, HTN, GERD, DM2 who presented for outpatient cardiac cath in the setting of unstable angina.   Assessment & Plan    1. CAD with unstable angina: underwent cardiac cath yesterday with Dr. Claiborne Billings noting severe multivessel CAD, patent LIMA-LAD, SVG-dRCA. SVG to OM1 with new 75% stenosis along with SVG to diag with 90% focal lesion in the proximal body. PCI/ DESx1 to SVG-diag with reduction of lesion to 0%. Recommendation for DAPT with ASA/plavix indefinitely given extensive CAD. Medical therapy for concomitant CAD.  -- has left shoulder burning/pain which is similar to what brought him in but also reports he was knocked down by his 200lb Billy goat 2 months ago and has has trouble with pain since that time as well. Suspect shoulder pain is not cardiac in nature -- continue ASA/plavix, BB, ACE, Imdur, fenofibrate and Zetia  2. HLD: developed liver toxicity after being Mevacor in 1987, has since not been on statin therapy -- on fenofibrate 160mg  daily and Zetia 10mg  daily  3. HTN: blood pressures have been elevated yesterday. Home meds held prior to cath and had pain from acute urinary retention 2/2 to clots. -- Improved on last reading this morning. Will see how readings are with  ambulation with CR and adjust as appropriate.  -- continue amlodipine 10mg  daily, benazepril 40mg  daily, Toprol XL 75mg  daily  4. DM: SSI while inpatient  5. Hematuria with clot retention: developed partial urinary retention while in the cath lab, foley placed but little UOP noted. Then removed, unable to void well.  -- seen by Dr. Louis Meckel with 22 french 3-way foley placed and irrigated with 2L NS with improvement in urine color. Not started on continuous bladder irrigation given improvement -- still with small clots noted in foley line this morning but passing clear urine as well -- sees urology as an outpatient in [facility], has been on Bactrim for freq UTIs  For questions or updates, please contact [facility] Please consult [email or web address] for contact info under        Signed, Reino Bellis, NP  10/24/2020, 8:08 AM

## 2020-10-24 NOTE — Progress Notes (Signed)
Urinary catheter removed. Balloon intact. No clots noted at this time. Patient encouraged fluid intake. Monitor for any signs of retention.

## 2020-10-24 NOTE — Progress Notes (Signed)
Urology Inpatient Progress Report  Coronary artery disease involving native coronary artery of native heart with unstable angina pectoris ([place]) [I25.110]  Procedure(s): LEFT HEART CATH AND CORS/GRAFTS ANGIOGRAPHY CORONARY STENT INTERVENTION  1 Day Post-Op   Intv/Subj: 54 French three-way Foley catheter placed last night, 2 L of irrigation used to remove extensive clots within the patient's bladder.  After that the urine cleared fairly quickly.  This morning the catheter was removed, and the patient was unable to void except for a small amount of blood-tinged urine.  A Foley catheter was then replaced by the catheter team.  Currently the patient is resting comfortably and there is straw-colored urine in the catheter.  Active Problems:   Unstable angina (HCC)   Coronary artery disease involving native coronary artery of native heart with unstable angina pectoris ([place])  Current Facility-Administered Medications  Medication Dose Route Frequency Provider Last Rate Last Admin  . 0.9 %  sodium chloride infusion  250 mL Intravenous PRN Troy Sine, MD      . acetaminophen (TYLENOL) tablet 650 mg  650 mg Oral Q4H PRN Troy Sine, MD   650 mg at 10/24/20 1740  . amLODipine (NORVASC) tablet 10 mg  10 mg Oral Daily [place], [place], [place]   10 mg at 10/24/20 0912  . aspirin chewable tablet 81 mg  81 mg Oral Daily Troy Sine, MD   81 mg at 10/24/20 8366  . benazepril (LOTENSIN) tablet 40 mg  40 mg Oral Daily [place], [place], [place]   40 mg at 10/24/20 0913  . Chlorhexidine Gluconate Cloth 2 % PADS 6 each  6 each Topical Daily Ardis Hughs, MD      . clopidogrel (PLAVIX) tablet 75 mg  75 mg Oral Q breakfast Troy Sine, MD   75 mg at 10/24/20 2947  . diazepam (VALIUM) tablet 5 mg  5 mg Oral Q6H PRN Troy Sine, MD   5 mg at 10/23/20 1857  . ezetimibe (ZETIA) tablet 10 mg  10 mg Oral QHS Almyra Deforest, [place]   10 mg at 10/23/20 2159  . fenofibrate tablet 160 mg  160 mg Oral Daily Almyra Deforest, [place]    160 mg at 10/24/20 0911  . insulin aspart (novoLOG) injection 0-15 Units  0-15 Units Subcutaneous TID WC Almyra Deforest, PA   3 Units at 10/24/20 1740  . isosorbide mononitrate (IMDUR) 24 hr tablet 150 mg  150 mg Oral Daily Reino Bellis B, NP   150 mg at 10/24/20 1038  . metoprolol succinate (TOPROL-XL) 24 hr tablet 75 mg  75 mg Oral Daily Almyra Deforest, [place]   75 mg at 10/24/20 0911  . ondansetron (ZOFRAN) injection 4 mg  4 mg Intravenous Q6H PRN Troy Sine, MD      . pantoprazole (PROTONIX) EC tablet 40 mg  40 mg Oral Daily [place], [place], [place]   40 mg at 10/24/20 0911  . sodium chloride flush (NS) 0.9 % injection 3 mL  3 mL Intravenous Q12H Shelva Majestic A, MD      . sodium chloride flush (NS) 0.9 % injection 3 mL  3 mL Intravenous Q12H Troy Sine, MD   3 mL at 10/24/20 0913  . sodium chloride flush (NS) 0.9 % injection 3 mL  3 mL Intravenous PRN Troy Sine, MD      . sulfamethoxazole-trimethoprim (BACTRIM DS) 800-160 MG per tablet 1 tablet  1 tablet Oral BID Almyra Deforest, PA   1 tablet at  10/24/20 0912     Objective: Vital: Vitals:   10/24/20 0500 10/24/20 0853 10/24/20 1245 10/24/20 1616  BP: 135/75 (!) 177/102 (!) 153/73 (!) 153/69  Pulse: 74 86 83 88  Resp: 20 20 16 20   Temp: 98.5 F (36.9 C) 99.2 F (37.3 C) 98.5 F (36.9 C) 98 F (36.7 C)  TempSrc: Oral Oral Axillary Oral  SpO2: 100%     Weight:      Height:       I/Os: I/O last 3 completed shifts: In: 893 [P.O.:720; I.V.:173] Out: 2500 [Urine:2500]  Physical Exam:  General: Patient is in no apparent distress Lungs: Normal respiratory effort, chest expands symmetrically. GI: The abdomen is soft and nontender without mass. Foley: Straw-colored urine in the catheter. Ext: lower extremities symmetric  Lab Results: Recent Labs    10/24/20 0230  WBC 8.9  HGB 11.5*  HCT 33.0*   Recent Labs    10/24/20 0230  NA 141  K 3.6  CL 110  CO2 25  GLUCOSE 161*  BUN 15  CREATININE 0.96  CALCIUM 8.9   No results for  input(s): LABPT, INR in the last 72 hours. No results for input(s): LABURIN in the last 72 hours. Results for orders placed or performed during the hospital encounter of 10/19/20  SARS CORONAVIRUS 2 (TAT 6-24 HRS) Nasopharyngeal Nasopharyngeal Swab     Status: None   Collection Time: 10/19/20  2:14 PM   Specimen: Nasopharyngeal Swab  Result Value Ref Range Status   SARS Coronavirus 2 NEGATIVE NEGATIVE Final    Comment: (NOTE) SARS-CoV-2 target nucleic acids are NOT DETECTED.  The SARS-CoV-2 RNA is generally detectable in upper and lower respiratory specimens during the acute phase of infection. Negative results do not preclude SARS-CoV-2 infection, do not rule out co-infections with other pathogens, and should not be used as the sole basis for treatment or other patient management decisions. Negative results must be combined with clinical observations, patient history, and epidemiological information. The expected result is Negative.  Fact Sheet for Patients: SugarRoll.be  Fact Sheet for Healthcare Providers: https://www.woods-mathews.com/  This test is not yet approved or cleared by the [place] FDA and  has been authorized for detection and/or diagnosis of SARS-CoV-2 by FDA under an Emergency Use Authorization (EUA). This EUA will remain  in effect (meaning this test can be used) for the duration of the COVID-19 declaration under Se ction 564(b)(1) of the Act, 21 U.S.C. section 360bbb-3(b)(1), unless the authorization is terminated or revoked sooner.  Performed at [facility], [place] [address], [place], [place] [address]     Studies/Results: CARDIAC CATHETERIZATION  Result Date: 10/23/2020  Ost LAD to Prox LAD lesion is 100% stenosed.  Mid LM to Dist LM lesion is 90% stenosed.  Ramus lesion is 80% stenosed.  Prox Graft lesion before 1st Mrg is 25% stenosed.  Prox Graft to Mid Graft lesion between 1st Mrg and 2nd Mrg is  75% stenosed.  Previously placed Mid Graft to Insertion stent (unknown type) is widely patent.  Origin to Prox Graft lesion is 90% stenosed.  Post intervention, there is a 0% residual stenosis.  Prox RCA lesion is 90% stenosed.  Mid RCA-1 lesion is 95% stenosed.  Mid RCA-2 lesion is 99% stenosed.  Mid RCA-3 lesion is 100% stenosed.  The left ventricular systolic function is normal.  LV end diastolic pressure is normal.  The left ventricular ejection fraction is 55-65% by visual estimate.  Ost Cx to Prox Cx lesion  is 50% stenosed.  A stent was successfully placed.  Severe multivessel native CAD with 95% distal left main stenosis; total ostial occlusion of the LAD; diffuse 80% stenosis in a small caliber ramus intermediate vessel;  40 to 50% proximal circumflex stenoses filling to the marginal vessels; and severe diffuse proximal to mid RCA disease with subtotal/total distal occlusion. Patent LIMA graft supplying the LAD. Sequential vein graft supplying the OM1 and distal circumflex with mild 20% focal narrowing in the proximal third of the graft and 75% eccentric stenosis in the sequential limb of the graft immediately after the OM1 vessel. SVG supplying the diagonal vessel with a widely patent previously placed mid distal stent.   There is a new 90% smooth focal stenosis in the proximal body of the graft with TIMI-3 flow. Patent SVG supplying the distal RCA. Normal LV contractility without focal segmental wall motion abnormalities.  LVEDP 16 mm. Successful PCI to the proximal vein graft supplying the diagonal vessel with insertion of a 3.5 x 16 mm Synergy DES stent dilated up to 3.74 mm with the stenosis being reduced to 0%. RECOMMENDATION: Continue long-term DAPT indefinitely.  Medical therapy for concomitant CAD.  If he develops recurrent increasing anginal symptomatology, consider future intervention to the sequential limb of the vein graft supplying the circumflex vessel.  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  Continue nonstatin therapy for aggressive lipid-lowering therapy.    Assessment: Procedure(s): LEFT HEART CATH AND CORS/GRAFTS ANGIOGRAPHY CORONARY STENT INTERVENTION, 1 Day Post-Op  doing well.  Plan: Recommend the patient be discharged home with a Foley catheter.  Please provide some catheter instructions for him.  The patient will be scheduled for removal of his Foley catheter in 1 week.  I have called to arrange this, will contact him with his appointment time.  Please contact me for any additional questions or concerns.   Louis Meckel, MD Urology 10/24/2020, 5:46 PM

## 2020-10-25 DIAGNOSIS — I200 Unstable angina: Secondary | ICD-10-CM | POA: Diagnosis not present

## 2020-10-25 DIAGNOSIS — I25110 Atherosclerotic heart disease of native coronary artery with unstable angina pectoris: Secondary | ICD-10-CM | POA: Diagnosis not present

## 2020-10-25 LAB — LIPID PANEL
Cholesterol: 133 mg/dL (ref 0–200)
HDL: 54 mg/dL (ref >40)
LDL Cholesterol: 63 mg/dL (ref 0–99)
Total CHOL/HDL Ratio: 2.5 RATIO
Triglycerides: 80 mg/dL (ref <150)
VLDL: 16 mg/dL (ref 0–40)

## 2020-10-25 LAB — BASIC METABOLIC PANEL
Anion gap: 6 (ref 5–15)
BUN: 15 mg/dL (ref 8–23)
CO2: 23 mmol/L (ref 22–32)
Calcium: 8.5 mg/dL — ABNORMAL LOW (ref 8.9–10.3)
Chloride: 109 mmol/L (ref 98–111)
Creatinine, Ser: 1.06 mg/dL (ref 0.61–1.24)
GFR, Estimated: >60 mL/min (ref >60)
Glucose, Bld: 113 mg/dL — ABNORMAL HIGH (ref 70–99)
Potassium: 3.5 mmol/L (ref 3.5–5.1)
Sodium: 138 mmol/L (ref 135–145)

## 2020-10-25 LAB — GLUCOSE, CAPILLARY: Glucose-Capillary: 127 mg/dL — ABNORMAL HIGH (ref 70–99)

## 2020-10-25 MED ORDER — ISOSORBIDE MONONITRATE ER 60 MG PO TB24: 150.0000 mg | ORAL_TABLET | Freq: Every day | ORAL | 3 refills | Status: DC

## 2020-10-25 NOTE — Discharge Instructions (Addendum)

## 2020-10-25 NOTE — Discharge Summary (Addendum)
The patient has been seen in conjunction with Vin Bhagat, PAC. All aspects of care have been considered and discussed. The patient has been personally interviewed, examined, and all clinical data has been reviewed.   The right femoral cath site is unremarkable.  He now has an indwelling Foley and will follow up with Dr. Louis Meckel next Wednesday as an outpatient.  He is ready for discharge from cardiac standpoint.  Discharge Summary    Patient ID: Daryl Joseph MRN: [id number]; DOB: May 17, 1933  Admit date: 10/23/2020 Discharge date: 10/25/2020  PCP:  Hall, Lisa M, [place]  Cardiologist:  Shelva Majestic, MD *  Discharge Diagnoses    Active Problems:   Unstable angina [facility])   Coronary artery disease involving native coronary artery of native heart with unstable angina pectoris ([place])   Hematuria with clot retention   HTN  HLD  DM  Diagnostic Studies/Procedures    Cath: 10/23/20   Ost LAD to Prox LAD lesion is 100% stenosed.  Mid LM to Dist LM lesion is 90% stenosed.  Ramus lesion is 80% stenosed.  Prox Graft lesion before 1st Mrg is 25% stenosed.  Prox Graft to Mid Graft lesion between 1st Mrg and 2nd Mrg is 75% stenosed.  Previously placed Mid Graft to Insertion stent (unknown type) is widely patent.  Origin to Prox Graft lesion is 90% stenosed.  Post intervention, there is a 0% residual stenosis.  Prox RCA lesion is 90% stenosed.  Mid RCA-1 lesion is 95% stenosed.  Mid RCA-2 lesion is 99% stenosed.  Mid RCA-3 lesion is 100% stenosed.  The left ventricular systolic function is normal.  LV end diastolic pressure is normal.  The left ventricular ejection fraction is 55-65% by visual estimate.  Ost Cx to Prox Cx lesion is 50% stenosed.  A stent was successfully placed.  Severe multivessel native CAD with 95% distal left main stenosis; total ostial occlusion of the LAD; diffuse 80% stenosis in a small caliber ramus  intermediate vessel; 40 to 50% proximal circumflex stenoses filling to the marginal vessels; and severe diffuse proximal to mid RCA disease with subtotal/total distal occlusion.  Patent LIMA graft supplying the LAD.  Sequential vein graft supplying the OM1 and distal circumflex with mild 20% focal narrowing in the proximal third of the graft and 75% eccentric stenosis in the sequential limb of the graft immediately after the OM1 vessel.  SVG supplying the diagonal vessel with a widely patent previously placed mid distal stent. There is a new 90% smooth focal stenosis in the proximal body of the graft with TIMI-3 flow.  Patent SVG supplying the distal RCA.  Normal LV contractility without focal segmental wall motion abnormalities. LVEDP 16 mm.  Successful PCI to the proximal vein graft supplying the diagonal vessel with insertion of a 3.5 x 16 mm Synergy DES stent dilated up to 3.74 mm with the stenosis being reduced to 0%.  RECOMMENDATION: Continue long-term DAPT indefinitely. Medical therapy for concomitant CAD. If he develops recurrent increasing anginal symptomatology, consider future intervention to the sequential limb of the vein graft supplying the circumflex vessel. 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 Continue nonstatin therapy for aggressive lipid-lowering therapy.  Diagnostic Dominance: Right    Intervention       History of Present Illness     Daryl Joseph is a 87 y.o. male with PMH of CAD s/p CABG '99 with subsequent PCI,  HLD, HTN, GERD, DM2 and recurrent UTIs who presented for outpatient cardiac cath in the setting of unstable angina.   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   Seen by Dr. Claiborne Billings 10/17/20. He was having substernal chest tightness and pressure similar to prior angina. Recommended  cath.    Hospital Course     Consultants: Urology   1. CAD with unstable angina: underwent cardiac cath with Dr. Claiborne Billings showing severe multivessel CAD, patent LIMA-LAD, SVG-dRCA. SVG to OM1 with new 75% stenosis along with SVG to diag with 90% focal lesion in the proximal body. PCI/ DESx1 to SVG-diag with reduction of lesion to 0%. Recommendation for DAPT with ASA/plavix indefinitely given extensive CAD. Medical therapy for concomitant CAD.  -- has left shoulder burning/pain which is similar to what brought him in but also reports he was knocked down by his 200lb Billy goat 2 months ago and has has trouble with pain since that time as well. Suspect shoulder pain is not cardiac in nature -- continue ASA/plavix, BB, ACE, Imdur, fenofibrate and Zetia  2. HLD: developed liver toxicity after being Mevacor in 1987, has since not been on statin therapy -- on fenofibrate 160mg  daily and Zetia 10mg  daily  3. HTN: blood pressures initially elevated while hold meds for cath and in pain. Improved with restarting medications.  -- continue amlodipine 10mg  daily, benazepril 40mg  daily, Toprol XL 75mg  daily  4. DM: SSI while inpatient  5. Hematuria with clot retention: developed partial urinary retention while in the cath lab, foley placed but little UOP noted. Then removed, unable to void well.  -- seen by Dr. Louis Meckel with 22 french 3-way foley placed and irrigated with 2L NS with improvement in urine color and then foley removed. However, unable to avoid again despite sensation of need to urinate. A Foley catheter was then replaced by the catheter team and sen by Dr. Louis Meckel again. Plan to discharge patient on Foley and outpatient follow up in 1 week.  -- Also sees urology as an outpatient in [facility], has been on chronic Bactrim for freq UTIs  The patient been seen by Dr. Tamala Julian  today and deemed ready for discharge home. All follow-up appointments have been scheduled. Discharge medications are  listed below.   Did the patient have an acute coronary syndrome (MI, NSTEMI, STEMI, etc) this admission?:  No                               Did the patient have a percutaneous coronary intervention (stent / angioplasty)?:  Yes.     Cath/PCI Registry Performance & Quality Measures: 4. Aspirin prescribed? - Yes 5. ADP Receptor Inhibitor (Plavix/Clopidogrel, Brilinta/Ticagrelor or Effient/Prasugrel) prescribed (includes medically managed patients)? - Yes 6. High Intensity Statin (Lipitor 40-80mg  or Crestor 20-40mg ) prescribed? - No - due to prior intolerance  7. For EF <40%, was ACEI/ARB prescribed? - Yes 8. For EF <40%, Aldosterone Antagonist (Spironolactone or Eplerenone) prescribed? - Not Applicable (EF >/= 56%) 9. Cardiac Rehab Phase II ordered? - Yes      Discharge Vitals Blood pressure (!) 156/61, pulse 78, temperature 99.5 F (37.5 C), temperature source Oral, resp. rate (!) 22, height 5\' 10"  (1.778 m), weight 66.4 kg, SpO2 95 %.  Filed Weights   10/23/20 0544 10/25/20 0513  Weight: 70.3 kg 66.4 kg   Physical Exam HENT:  Head: Normocephalic.     Nose: Nose normal.  Eyes:     Extraocular Movements: Extraocular movements intact.     Pupils: Pupils are equal, round, and reactive to light.  Cardiovascular:     Rate and Rhythm: Normal rate and regular rhythm.  Pulmonary:     Effort: Pulmonary effort is normal.     Breath sounds: Normal breath sounds.  Abdominal:     General: Abdomen is flat.     Palpations: Abdomen is soft.  Musculoskeletal:        General: Normal range of motion.     Cervical back: Normal range of motion.  Skin:    General: Skin is warm and dry.  Neurological:     General: No focal deficit present.     Mental Status: He is alert and oriented to person, place, and time.  Psychiatric:        Mood and Affect: Mood normal.        Behavior: Behavior normal.    Labs & Radiologic Studies    CBC Recent Labs    10/24/20 0230  WBC 8.9  HGB 11.5*   HCT 33.0*  MCV 86.2  PLT 976   Basic Metabolic Panel Recent Labs    10/24/20 0230 10/25/20 0206  NA 141 138  K 3.6 3.5  CL 110 109  CO2 25 23  GLUCOSE 161* 113*  BUN 15 15  CREATININE 0.96 1.06  CALCIUM 8.9 8.5*   High Sensitivity Troponin:   Recent Labs  Lab 10/08/20 1632 10/08/20 1832  TROPONINIHS 6 6    Hemoglobin A1C Recent Labs    10/23/20 1952  HGBA1C 7.5*   Fasting Lipid Panel Recent Labs    10/25/20 0206  CHOL 133  HDL 54  LDLCALC 63  TRIG 80  CHOLHDL 2.5   _____________  DG Chest 2 View  Result Date: 10/08/2020 CLINICAL DATA:  Chest pain radiating into the left shoulder EXAM: CHEST - 2 VIEW COMPARISON:  06/19/2012 FINDINGS: Cardiac shadow is stable. Postsurgical changes are again seen. Aortic calcifications are noted. Lungs are well aerated bilaterally. No focal infiltrate or effusion is seen. Degenerative changes of the thoracic spine are noted. Extrinsic leads are noted over the chest. Bilateral nipple shadows are again seen. IMPRESSION: No acute abnormality noted. Electronically Signed   By: Inez Catalina M.D.   On: 10/08/2020 17:25   CARDIAC CATHETERIZATION  Result Date: 10/23/2020  Ost LAD to Prox LAD lesion is 100% stenosed.  Mid LM to Dist LM lesion is 90% stenosed.  Ramus lesion is 80% stenosed.  Prox Graft lesion before 1st Mrg is 25% stenosed.  Prox Graft to Mid Graft lesion between 1st Mrg and 2nd Mrg is 75% stenosed.  Previously placed Mid Graft to Insertion stent (unknown type) is widely patent.  Origin to Prox Graft lesion is 90% stenosed.  Post intervention, there is a 0% residual stenosis.  Prox RCA lesion is 90% stenosed.  Mid RCA-1 lesion is 95% stenosed.  Mid RCA-2 lesion is 99% stenosed.  Mid RCA-3 lesion is 100% stenosed.  The left ventricular systolic function is normal.  LV end diastolic pressure is normal.  The left ventricular ejection fraction is 55-65% by visual estimate.  Ost Cx to Prox Cx lesion is 50% stenosed.  A  stent was successfully placed.  Severe multivessel native CAD with 95% distal left main stenosis; total ostial occlusion of the LAD; diffuse 80% stenosis in a small caliber ramus intermediate vessel;  40 to  50% proximal circumflex stenoses filling to the marginal vessels; and severe diffuse proximal to mid RCA disease with subtotal/total distal occlusion. Patent LIMA graft supplying the LAD. Sequential vein graft supplying the OM1 and distal circumflex with mild 20% focal narrowing in the proximal third of the graft and 75% eccentric stenosis in the sequential limb of the graft immediately after the OM1 vessel. SVG supplying the diagonal vessel with a widely patent previously placed mid distal stent.   There is a new 90% smooth focal stenosis in the proximal body of the graft with TIMI-3 flow. Patent SVG supplying the distal RCA. Normal LV contractility without focal segmental wall motion abnormalities.  LVEDP 16 mm. Successful PCI to the proximal vein graft supplying the diagonal vessel with insertion of a 3.5 x 16 mm Synergy DES stent dilated up to 3.74 mm with the stenosis being reduced to 0%. RECOMMENDATION: Continue long-term DAPT indefinitely.  Medical therapy for concomitant CAD.  If he develops recurrent increasing anginal symptomatology, consider future intervention to the sequential limb of the vein graft supplying the circumflex vessel.  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  Continue nonstatin therapy for aggressive lipid-lowering therapy.   Disposition   Pt is being discharged home today in good condition.  Follow-up Plans & Appointments     Follow-up Information    Ardis Hughs, MD Follow up on 10/31/2020.   Specialty: Urology Why: Catheter removal Please call for timing  Contact information: [place] [address] [phone]        Darreld Mclean, PA-C. Go on 11/12/2020.   Specialties: Psychologist, occupational, Cardiology Why: @3 :15 for cath follow up with Dr. Evette Georges PA Contact information: [address] [place] [address] [phone]              Discharge Instructions    Amb Referral to Cardiac Rehabilitation   Complete by: As directed    Diagnosis: Coronary Stents   After initial evaluation and assessments completed: Virtual Based Care may be provided alone or in conjunction with Phase 2 Cardiac Rehab based on patient barriers.: Yes   Diet - low sodium heart healthy   Complete by: As directed    Discharge instructions   Complete by: As directed    No driving for 48 hours. No lifting over 5 lbs for 1 week. No sexual activity for 1 week. Keep procedure site clean & dry. If you notice increased pain, swelling, bleeding or pus, call/return!  You may shower, but no soaking baths/hot tubs/pools for 1 week.   Increase activity slowly   Complete by: As directed       Discharge Medications   Allergies as of 10/25/2020      Reactions   Statins Other (See Comments)   Myositis with statins "affected my liver; I almost had Hepatitis" (06/25/2012)   Welchol [colesevelam] Other (See Comments)   unknown      Medication List    TAKE these medications   amLODipine 10 MG tablet Commonly known as: NORVASC TAKE 1 TABLET BY MOUTH  DAILY   aspirin 81 MG tablet Take 81 mg by mouth daily.   benazepril 40 MG tablet Commonly known as: LOTENSIN TAKE 1 TABLET BY MOUTH  DAILY   clopidogrel 75 MG tablet Commonly known as: PLAVIX TAKE 1 TABLET BY MOUTH  DAILY   Dry Eye Relief Drops 0.2-0.2-1 % Soln Generic drug: Glycerin-Hypromellose-PEG 400 Place 1 drop into  both eyes daily.   EYE VITAMINS PO Take 1 tablet by mouth every evening.   ezetimibe 10 MG tablet Commonly known as: ZETIA TAKE 1 TABLET (10 MG TOTAL) BY MOUTH AT BEDTIME   fenofibrate 160 MG tablet TAKE 1 TABLET BY MOUTH  DAILY   folic acid 1 MG tablet Commonly known as: FOLVITE Take one tablet  by mouth daily   hydrochlorothiazide 12.5 MG capsule Commonly known as: MICROZIDE Take 1 capsule (12.5 mg total) by mouth as needed (swelling). What changed: when to take this   isosorbide mononitrate 60 MG 24 hr tablet Commonly known as: IMDUR Take 2.5 tablets (150 mg total) by mouth daily. Take 90 mg(1 1/2 tablet) in the morning and 60 mg (1 tablet) at bedtime What changed:   how much to take  how to take this  when to take this   metFORMIN 500 MG tablet Commonly known as: GLUCOPHAGE Take 500 mg by mouth 2 (two) times daily with a meal.   metoprolol succinate 50 MG 24 hr tablet Commonly known as: TOPROL-XL TAKE 1 AND 1/2 TABLETS BY  MOUTH ONCE DAILY TAKE WITH  OR IMMEDIATELY FOLLOWING A  MEAL What changed: See the new instructions.   nitroGLYCERIN 0.4 MG SL tablet Commonly known as: NITROSTAT Place 1 tablet under the tongue every 5 minutes as needed for chest pain What changed: See the new instructions.   pantoprazole 40 MG tablet Commonly known as: PROTONIX Take 40 mg by mouth daily.   sulfamethoxazole-trimethoprim 800-160 MG tablet Commonly known as: BACTRIM DS Take 1 tablet by mouth 2 (two) times daily.          Outstanding Labs/Studies   none  Duration of Discharge Encounter   Greater than 30 minutes including physician time.  Jarrett Soho, PA 10/25/2020, 9:46 AM

## 2020-10-26 ENCOUNTER — Telehealth (HOSPITAL_COMMUNITY): Payer: Self-pay

## 2020-10-26 NOTE — Telephone Encounter (Signed)
Cardiac rehab referral for Ph.II faxed to [facility].

## 2020-11-01 NOTE — Progress Notes (Signed)
Cardiology Office Note:    Date:  11/12/2020   ID:  Daryl Joseph, DOB 29-Sep-1932, MRN [id number]  PCP:  Daryl Deeds, PA-C  Cardiologist:  Daryl Majestic, MD  Electrophysiologist:  None   Referring MD: Daryl Deeds, PA-C   Chief Complaint: follow-up of chest pain and recent PCI  History of Present Illness:    Daryl Joseph is a 87 y.o. male with a history of CAD s/p remote CABG x4 in 1999 with subsequent DES to SVG-Diag in 06/2012 and again on 10/23/2020, mild aortic stenosis on Echo in 09/2019, hypertension, hyperlipidemia not on statin due to statin-induced liver toxicity, and type 2 diabetes mellitus, GERD, and frequent UTIs on chronic Bactrim who is followed by Daryl Joseph and presents today for hospital follow-up of chest pain and recent PCI.  Last Echo in 09/2019 showed LVEF of 60-65% with normal wall motion, mild LVH, and grade 1 diastolic dysfunction.  Patient was last seen by Daryl Joseph on 10/17/2020 at which time he reported episodic chest tightness and pressure since being diagnosed with COVID over 2 months prior. He was seen in the ED on 10/08/2020 at which time BP was elevated. High-sensitivity troponin was negative so he was discharged. At visit with Daryl Joseph, he continued to report episodes of chest tightness and pressure that were similar to  previous anginal symptoms. Therefore, decision was made to proceed with outpatient cardiac catheterization. This was performed on 10/23/2020 and showed severe multivessel native CAD with patent LIMA to LAD graft, patent SVG to distal RCA, sequential SVG to OM1 and distal LCX with mild 20% narrowing in the proximal 3rd of the graft and 75% eccentric stenosis in the sequential limb of the graft immediately after the OM1 vessel, and new 90% smooth focal stenosis in the proximal SVG to Diag with prior stent patent. Patient underwent successful PCI with DES to new lesion in SVG to Diag graft. Plan was to continue long-term DAPT indefinitely. If patient  develops recurrent chest pain, intervention to sequential limb of vein graft to LCX could be considered. Patient developed partial urinary retention while in the cath lab and Foley was placed he was noted to have gross hematuria but little urinary output. Foley was removed and he was still unable to void much. Patient was ultimately admitted for further management. Daryl Joseph (Urology) was consulted. A 22 French three-way Foley catheter was placed and Daryl Joseph hand irrigated him with 2L of normal saline and blood clots were removed. After this, urine was significantly more clear. Catheter was removed on 09/26/2020 but patient was still unable to void except for a small amount of blood tinged urine. Therefore, Foley catheter was again placed. Urology recommended patient be discharged with Foley Catheter and follow-up with primary Urologist. He was ultimately discharged on 10/25/2020. Of note, patient did continue to have burning shoulder pain following cath but reported being knocked down by a 200lb goat about 2 months ago with pain since that time. Therefore, shoulder pain was not felt to be cardiac in nature.  Patient presents today for follow-up. Here with wife. Patient reports for the first week after PCI, he continued to have some vague chest discomfort and problems with reflux where he was "burping air" almost constantly. He went to the [facility] ED for further evaluation of this on 11/05/2020 because he thought he was having a heart attack. EKG showed no acute changes and troponin was negative. It was ultimately felt to be related to GERD.  He was given Maalox and has not had any issues since then. No recurrent chest pain or shortness. No orthopnea, PND, or lower extremity edema. No palpitations, lightheadedness/dizziness, or near syncope. Patient has been walking 20-25 minutes twice a day and has no chest pain or shortness of breath with this.  Past Medical History:  Diagnosis Date  . Anginal pain  ([place])   . Arthritis    "mostly in my hands" (06/25/2012)  . CAD (coronary artery disease) 03/25/2007   30 d monitor - sinus rhythm w/ some PACs  . DM type 2 (diabetes mellitus, type 2) ([place])   . GERD (gastroesophageal reflux disease)   . H/O hiatal hernia   . Hyperlipidemia   . Hypertension   . NSTEMI (non-ST elevated myocardial infarction) ([place]) 06/18/2012  . S/P angioplasty with stent, to VG -diag 1, LAD and septal perferator vessels  06/25/2012    Past Surgical History:  Procedure Laterality Date  . CARDIAC CATHETERIZATION  06/25/2012   3.5 x 7mm Xience Xpedition DES inserted in distal third of LAD  . CARDIOVASCULAR STRESS TEST  11/05/2010   R/S MV - EF 73%; post stress LV fcn normal, no significant wall motion abnormalities; perfusion defect in inferior myocardial region consistent w/ diaphragmatic attenuation, remaining myocardium demonstrates normal perfusion; Exercise capacity 10 METS; stress EKG showed changes from baseline EKG; study results unchanged/within normal variance of last study (09/2008)  . CERVICAL DISC SURGERY  1990's  . CORONARY ANGIOPLASTY WITH STENT PLACEMENT  06/25/2012   "1; first one for me" (06/25/2012)  . CORONARY ARTERY BYPASS GRAFT  10/1997   CABG X 4 LIMA to LAD; vein to diagonal; sequential vein to obtuse marginal and distal circumflex and vein to RCA  . CORONARY STENT INTERVENTION  10/23/2020  . CORONARY STENT INTERVENTION N/A 10/23/2020   Procedure: CORONARY STENT INTERVENTION;  Surgeon: Daryl Sine, MD;  Location: [facility];  Service: Cardiovascular;  Laterality: N/A;  . DOPPLER ECHOCARDIOGRAPHY  09/23/2011   EF >55%; proximal septal thickening noted; mild septal hypokinesis; ; mild/mod tricuspid regurgitation; aortic root sclerosis/calcification  . LEFT HEART CATH AND CORS/GRAFTS ANGIOGRAPHY N/A 10/23/2020   Procedure: LEFT HEART CATH AND CORS/GRAFTS ANGIOGRAPHY;  Surgeon: Daryl Sine, MD;  Location: [facility];  Service:  Cardiovascular;  Laterality: N/A;  . LEFT HEART CATHETERIZATION WITH CORONARY/GRAFT ANGIOGRAM N/A 06/21/2012   Procedure: LEFT HEART CATHETERIZATION WITH Beatrix Fetters;  Surgeon: Sanda Klein, MD;  Location: [facility];  Service: Cardiovascular;  Laterality: N/A;  . LEFT HEART CATHETERIZATION WITH CORONARY/GRAFT ANGIOGRAM N/A 08/01/2014   Procedure: LEFT HEART CATHETERIZATION WITH Beatrix Fetters;  Surgeon: Daryl Sine, MD;  Location: [facility] C[facility] LAB;  Service: Cardiovascular;  Laterality: N/A;  . PERCUTANEOUS CORONARY STENT INTERVENTION (PCI-S) N/A 06/25/2012   Procedure: PERCUTANEOUS CORONARY STENT INTERVENTION (PCI-S);  Surgeon: Daryl Sine, MD;  Location: [facility] CATH LAB;  Service: Cardiovascular;  Laterality: N/A;  . TONSILLECTOMY AND ADENOIDECTOMY     "when I was a kid" (06/25/2012)    Current Medications: Current Meds  Medication Sig  . amLODipine (NORVASC) 10 MG tablet TAKE 1 TABLET BY MOUTH  DAILY (Patient taking differently: Take 10 mg by mouth daily.)  . aspirin 81 MG tablet Take 81 mg by mouth daily.  . benazepril (LOTENSIN) 40 MG tablet TAKE 1 TABLET BY MOUTH  DAILY (Patient taking differently: Take 40 mg by mouth daily.)  . clopidogrel (PLAVIX) 75 MG tablet TAKE 1 TABLET BY MOUTH  DAILY (Patient taking  differently: Take 75 mg by mouth daily.)  . ezetimibe (ZETIA) 10 MG tablet TAKE 1 TABLET (10 MG TOTAL) BY MOUTH AT BEDTIME  . fenofibrate 160 MG tablet TAKE 1 TABLET BY MOUTH  DAILY (Patient taking differently: Take 160 mg by mouth daily.)  . folic acid (FOLVITE) 1 MG tablet Take one tablet by mouth daily (Patient taking differently: Take 1 mg by mouth daily.)  . Glycerin-Hypromellose-PEG 400 (DRY EYE RELIEF DROPS) 0.2-0.2-1 % SOLN Place 1 drop into both eyes daily.  . isosorbide mononitrate (IMDUR) 60 MG 24 hr tablet Take 2.5 tablets (150 mg total) by mouth daily. Take 90 mg(1 1/2 tablet) in the morning and 60 mg (1 tablet) at bedtime  . metFORMIN (GLUCOPHAGE)  500 MG tablet Take 500 mg by mouth 2 (two) times daily with a meal.   . metoprolol succinate (TOPROL-XL) 50 MG 24 hr tablet TAKE 1 AND 1/2 TABLETS BY  MOUTH ONCE DAILY TAKE WITH  OR IMMEDIATELY FOLLOWING A  MEAL (Patient taking differently: Take 75 mg by mouth daily.)  . Multiple Vitamins-Minerals (EYE VITAMINS PO) Take 1 tablet by mouth every evening.  . nitroGLYCERIN (NITROSTAT) 0.4 MG SL tablet Place 1 tablet under the tongue every 5 minutes as needed for chest pain (Patient taking differently: Place 0.4 mg under the tongue every 5 (five) minutes as needed for chest pain.)  . pantoprazole (PROTONIX) 40 MG tablet Take 40 mg by mouth daily.     Allergies:   Statins and Welchol [colesevelam]   Social History   Socioeconomic History  . Marital status: Married    Spouse name: Not on file  . Number of children: Not on file  . Years of education: Not on file  . Highest education level: Not on file  Occupational History  . Occupation: Art gallery manager    Comment: 45 yrs  Tobacco Use  . Smoking status: Former Smoker    Packs/day: 0.75    Years: 20.00    Pack years: 15.00  . Smokeless tobacco: Never Used  . Tobacco comment: 06/25/2012 "quit smoking cigarettes ~ 40 yr ago"  Vaping Use  . Vaping Use: Never used  Substance and Sexual Activity  . Alcohol use: No    Alcohol/week: 0.0 standard drinks  . Drug use: No  . Sexual activity: Not Currently  Other Topics Concern  . Not on file  Social History Narrative   Married, 69 yrs, no children. Retired Art gallery manager- 5yrs   Social Determinants of Radio broadcast assistant Strain: Not on [employer] Insecurity: Not on file  Transportation Needs: Not on file  Physical Activity: Not on file  Stress: Not on file  Social Connections: Not on file     Family History: The patient's family history includes Cancer - Colon in his father; Cancer - Other in his brother and brother; Coronary artery disease in his brother, father, and paternal grandfather;  Diabetes in his brother; Hypertension in his father and mother.  ROS:   Please see the history of present illness.     EKGs/Labs/Other Studies Reviewed:    The following studies were reviewed today:  Echocardiogram 09/26/2019: Impression: 1. Left ventricular ejection fraction, by estimation, is 60 to 65%. Left  ventricular ejection fraction by PLAX is 61 %. The left ventricle has  normal function. The left ventricle has no regional wall motion  abnormalities. There is mild concentric left  ventricular hypertrophy. Left ventricular diastolic parameters are  consistent with Grade I diastolic dysfunction (impaired relaxation).  2. Right ventricular systolic function is normal. The right ventricular  size is normal. There is normal pulmonary artery systolic pressure.  3. Right atrial size was mildly dilated.  4. The mitral valve is normal in structure and function. Trivial mitral  valve regurgitation. No evidence of mitral stenosis.  5. The aortic valve is tricuspid. Aortic valve regurgitation is not  visualized. Mild aortic valve stenosis.  6. The inferior vena cava is normal in size with greater than 50%  respiratory variability, suggesting right atrial pressure of 3 mmHg.  _______________  Left Cardiac Catheterization 10/23/2020:  Ost LAD to Prox LAD lesion is 100% stenosed.  Mid LM to Dist LM lesion is 90% stenosed.  Ramus lesion is 80% stenosed.  Prox Graft lesion before 1st Mrg is 25% stenosed.  Prox Graft to Mid Graft lesion between 1st Mrg and 2nd Mrg is 75% stenosed.  Previously placed Mid Graft to Insertion stent (unknown type) is widely patent.  Origin to Prox Graft lesion is 90% stenosed.  Post intervention, there is a 0% residual stenosis.  Prox RCA lesion is 90% stenosed.  Mid RCA-1 lesion is 95% stenosed.  Mid RCA-2 lesion is 99% stenosed.  Mid RCA-3 lesion is 100% stenosed.  The left ventricular systolic function is normal.  LV end diastolic  pressure is normal.  The left ventricular ejection fraction is 55-65% by visual estimate.  Ost Cx to Prox Cx lesion is 50% stenosed.  A stent was successfully placed.   Severe multivessel native CAD with 95% distal left main stenosis; total ostial occlusion of the LAD; diffuse 80% stenosis in a small caliber ramus intermediate vessel;  40 to 50% proximal circumflex stenoses filling to the marginal vessels; and severe diffuse proximal to mid RCA disease with subtotal/total distal occlusion.  Patent LIMA graft supplying the LAD.  Sequential vein graft supplying the OM1 and distal circumflex with mild 20% focal narrowing in the proximal third of the graft and 75% eccentric stenosis in the sequential limb of the graft immediately after the OM1 vessel.  SVG supplying the diagonal vessel with a widely patent previously placed mid distal stent.   There is a new 90% smooth focal stenosis in the proximal body of the graft with TIMI-3 flow.  Patent SVG supplying the distal RCA.  Normal LV contractility without focal segmental wall motion abnormalities.  LVEDP 16 mm.  Successful PCI to the proximal vein graft supplying the diagonal vessel with insertion of a 3.5 x 16 mm Synergy DES stent dilated up to 3.74 mm with the stenosis being reduced to 0%.  Recommendation: Continue long-term DAPT indefinitely.  Medical therapy for concomitant CAD.  If he develops recurrent increasing anginal symptomatology, consider future intervention to the sequential limb of the vein graft supplying the circumflex vessel.  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 Continue nonstatin therapy for aggressive lipid-lowering therapy.  Diagnostic Dominance: Right    Intervention        EKG:  EKG not ordered today. EKG personally reviewed and demonstrates normal sinus rhythm, rate 72 bpm, with 1st degree AV block (PR interval 286 ms) with no acute ischemic  changes. Normal axis. QTc 400 ms.  Recent Labs: 10/24/2020: Hemoglobin 11.5; Platelets 181 10/25/2020: BUN 15; Creatinine, Ser 1.06; Potassium 3.5; Sodium 138  Recent Lipid Panel    Component Value Date/Time   CHOL 133 10/25/2020 0206   TRIG 80 10/25/2020 0206   HDL 54 10/25/2020 0206   CHOLHDL  2.5 10/25/2020 0206   VLDL 16 10/25/2020 0206   LDLCALC 63 10/25/2020 0206    Physical Exam:    Vital Signs: BP (!) 128/52   Pulse 72   Ht 5\' 10"  (1.778 m)   Wt 155 lb 6.4 oz (70.5 kg)   BMI 22.30 kg/m     Wt Readings from Last 3 Encounters:  11/12/20 155 lb 6.4 oz (70.5 kg)  10/25/20 146 lb 4.8 oz (66.4 kg)  10/17/20 155 lb 9.6 oz (70.6 kg)     General: 87 y.o. Caucasian male in no acute distress. HEENT: Normocephalic and atraumatic. Sclera clear.  Neck: Supple. No JVD. Heart: RRR. Distinct S1 and S2. II-III/VI systolic murmur. No gallops or rubs. Radial pulses 2+ and equal bilaterally. Right femoral cath site soft with ecchymosis or signs of hematoma.  Lungs: No increased work of breathing. Clear to ausculation bilaterally. No wheezes, rhonchi, or rales.  Abdomen: Soft, non-distended, and non-tender to palpation.  Extremities: No lower extremity edema.    Skin: Warm and dry. Neuro: Alert and oriented x3. No focal deficits. Psych: Normal affect. Responds appropriately.  Assessment:    1. Coronary artery disease involving native coronary artery of native heart with other form of angina pectoris ([place])   2. Hx of CABG   3. Aortic valve stenosis, etiology of cardiac valve disease unspecified   4. Primary hypertension   5. Hyperlipidemia, unspecified hyperlipidemia type   6. Type 2 diabetes mellitus with complication, without long-term current use of insulin (HCC)     Plan:    CAD s/p CABG - History of remote CABG in 1999. Recent LHC on 10/23/2020 showed  - No angina. - Continue DAPT with Aspirin and Plavix indefinitely. - Continue Toprol-XL 75mg  daily and Imdur 90mg  in the  AM and 60mg  in the PM. - Continue Zetia and fenofibrate. Not on statin due to history of statin-induced liver toxicity.   Aortic Stenosis - Mild AS noted on Echo in 09/2019.  - Patient asymptomatic. Given advanced age, will defer need for routine monitoring to primary Cardiologist.  Hypertension - BP well controlled. - Continue Amlodipine 10mg  daily, Benazepril 40mg  daily, Imdur 90mg  in the AM and 60mg  in the PM, and Toprol-XL 75mg  daily.  Also on HCTZ 12.5mg  daily as needed for edema.  Hyperlipidemia - Recent lipid panel on 10/25/2020: Total Cholesterol 133, Triglycerides 80, HDL 54, LDL 63.  - LDL goal <70 given CAD. - Continue Zetia 10mg  daily and fenofibrate.   Type 2 Diabetes Mellitus - Management per primary team.  Disposition: Follow up in 6 months with Daryl Joseph.   Medication Adjustments/Labs and Tests Ordered: Current medicines are reviewed at length with the patient today.  Concerns regarding medicines are outlined above.  Orders Placed This Encounter  Procedures  . EKG 12-Lead   No orders of the defined types were placed in this encounter.   Patient Instructions  Medication Instructions:  Continue current medications  *If you need a refill on your cardiac medications before your next appointment, please call your pharmacy*   Lab Work: None Ordered   Testing/Procedures: None Ordered   Follow-Up: At [facility], you and your health needs are our priority.  As part of our continuing mission to provide you with exceptional heart care, we have created designated Provider Care Teams.  These Care Teams include your primary Cardiologist (physician) and Advanced Practice Providers (APPs -  Physician Assistants and Nurse Practitioners) who all work together to provide you with the care you  need, when you need it.  We recommend signing up for the patient portal called "MyChart".  Sign up information is provided on this After Visit Summary.  MyChart is used to connect  with patients for Virtual Visits (Telemedicine).  Patients are able to view lab/test results, encounter notes, upcoming appointments, etc.  Non-urgent messages can be sent to your provider as well.   To learn more about what you can do with MyChart, go to [email or web address].    Your next appointment:   6 month(s)  The format for your next appointment:   In Person  Provider:   You may see Daryl Majestic, MD or one of the following Advanced Practice Providers on your designated Care Team:    Almyra Deforest, PA-C  Fabian Sharp, PA-C or   Roby Lofts, PA-C         Signed, Darreld Mclean, [place]  11/12/2020 4:20 PM    [place]

## 2020-11-12 ENCOUNTER — Encounter: Payer: Self-pay | Admitting: Student

## 2020-11-12 ENCOUNTER — Other Ambulatory Visit: Payer: Self-pay

## 2020-11-12 ENCOUNTER — Ambulatory Visit: Payer: Medicare Other | Admitting: Student

## 2020-11-12 VITALS — BP 128/52 | HR 72 | Ht 70.0 in | Wt 155.4 lb

## 2020-11-12 DIAGNOSIS — I25118 Atherosclerotic heart disease of native coronary artery with other forms of angina pectoris: Secondary | ICD-10-CM | POA: Diagnosis not present

## 2020-11-12 DIAGNOSIS — E785 Hyperlipidemia, unspecified: Secondary | ICD-10-CM

## 2020-11-12 DIAGNOSIS — I10 Essential (primary) hypertension: Secondary | ICD-10-CM | POA: Diagnosis not present

## 2020-11-12 DIAGNOSIS — I350 Nonrheumatic aortic (valve) stenosis: Secondary | ICD-10-CM | POA: Diagnosis not present

## 2020-11-12 DIAGNOSIS — Z951 Presence of aortocoronary bypass graft: Secondary | ICD-10-CM

## 2020-11-12 DIAGNOSIS — E118 Type 2 diabetes mellitus with unspecified complications: Secondary | ICD-10-CM

## 2020-11-12 NOTE — Patient Instructions (Signed)
Medication Instructions:  Continue current medications  *If you need a refill on your cardiac medications before your next appointment, please call your pharmacy*   Lab Work: None Ordered   Testing/Procedures: None Ordered   Follow-Up: At [facility], you and your health needs are our priority.  As part of our continuing mission to provide you with exceptional heart care, we have created designated Provider Care Teams.  These Care Teams include your primary Cardiologist (physician) and Advanced Practice Providers (APPs -  Physician Assistants and Nurse Practitioners) who all work together to provide you with the care you need, when you need it.  We recommend signing up for the patient portal called "MyChart".  Sign up information is provided on this After Visit Summary.  MyChart is used to connect with patients for Virtual Visits (Telemedicine).  Patients are able to view lab/test results, encounter notes, upcoming appointments, etc.  Non-urgent messages can be sent to your provider as well.   To learn more about what you can do with MyChart, go to [email or web address].    Your next appointment:   6 month(s)  The format for your next appointment:   In Person  Provider:   You may see Shalom Ware Kelly, MD or one of the following Advanced Practice Providers on your designated Care Team:    Hao Meng, PA-C  Angela Duke, PA-C or   Krista Kroeger, PA-C     

## 2021-03-08 ENCOUNTER — Other Ambulatory Visit: Payer: Self-pay

## 2021-03-08 MED ORDER — ISOSORBIDE MONONITRATE ER 60 MG PO TB24: 150.0000 mg | ORAL_TABLET | Freq: Every day | ORAL | 3 refills | Status: DC

## 2021-03-11 ENCOUNTER — Other Ambulatory Visit: Payer: Self-pay | Admitting: *Deleted

## 2021-03-11 MED ORDER — ISOSORBIDE MONONITRATE ER 60 MG PO TB24: 150.0000 mg | ORAL_TABLET | Freq: Every day | ORAL | 2 refills | Status: DC

## 2021-03-21 ENCOUNTER — Other Ambulatory Visit: Payer: Self-pay | Admitting: Cardiovascular Disease

## 2021-04-04 ENCOUNTER — Other Ambulatory Visit: Payer: Self-pay | Admitting: Cardiovascular Disease

## 2021-05-02 ENCOUNTER — Telehealth: Payer: Self-pay

## 2021-05-02 NOTE — Telephone Encounter (Signed)
Received notification that Plavix was not on the patients drug list.  Under Medicare Part D.   Will route to MD to make aware for any recommendations.

## 2021-05-07 NOTE — Telephone Encounter (Signed)
If patient has been on long-term clopidogrel following his stents this should be covered by Medicare.  Trade name Plavix may not be covered but clopidogrel should since it is generic

## 2021-05-08 ENCOUNTER — Other Ambulatory Visit: Payer: Self-pay

## 2021-05-08 NOTE — Telephone Encounter (Signed)
Noted. Thanks!  Will monitor if they send anything else over.

## 2021-06-14 ENCOUNTER — Encounter: Payer: Self-pay | Admitting: Cardiovascular Disease

## 2021-06-14 ENCOUNTER — Ambulatory Visit: Payer: Medicare Other | Admitting: Cardiovascular Disease

## 2021-06-14 ENCOUNTER — Other Ambulatory Visit: Payer: Self-pay

## 2021-06-14 DIAGNOSIS — I2510 Atherosclerotic heart disease of native coronary artery without angina pectoris: Secondary | ICD-10-CM | POA: Diagnosis not present

## 2021-06-14 DIAGNOSIS — Z951 Presence of aortocoronary bypass graft: Secondary | ICD-10-CM | POA: Diagnosis not present

## 2021-06-14 DIAGNOSIS — E785 Hyperlipidemia, unspecified: Secondary | ICD-10-CM | POA: Diagnosis not present

## 2021-06-14 DIAGNOSIS — Z789 Other specified health status: Secondary | ICD-10-CM

## 2021-06-14 DIAGNOSIS — D171 Benign lipomatous neoplasm of skin and subcutaneous tissue of trunk: Secondary | ICD-10-CM

## 2021-06-14 DIAGNOSIS — I350 Nonrheumatic aortic (valve) stenosis: Secondary | ICD-10-CM | POA: Diagnosis not present

## 2021-06-14 DIAGNOSIS — E118 Type 2 diabetes mellitus with unspecified complications: Secondary | ICD-10-CM

## 2021-06-14 DIAGNOSIS — R600 Localized edema: Secondary | ICD-10-CM

## 2021-06-14 DIAGNOSIS — Z23 Encounter for immunization: Secondary | ICD-10-CM

## 2021-06-14 DIAGNOSIS — U071 COVID-19: Secondary | ICD-10-CM

## 2021-06-14 MED ORDER — ISOSORBIDE MONONITRATE ER 60 MG PO TB24: 150.0000 mg | ORAL_TABLET | Freq: Every day | ORAL | 3 refills | Status: DC

## 2021-06-14 MED ORDER — HYDROCHLOROTHIAZIDE 25 MG PO TABS: 25.0000 mg | ORAL_TABLET | Freq: Every day | ORAL | 3 refills | Status: DC

## 2021-06-14 NOTE — Patient Instructions (Signed)
Medication Instructions:  Increase hydrochlorothiazide to 25 mg each day over the next 3-4 days. Then you can decided, depending on your swelling, if you want to take 12.5 mg or 25 mg each day.  *If you need a refill on your cardiac medications before your next appointment, please call your pharmacy*   Follow-Up: At [facility], you and your health needs are our priority.  As part of our continuing mission to provide you with exceptional heart care, we have created designated Provider Care Teams.  These Care Teams include your primary Cardiologist (physician) and Advanced Practice Providers (APPs -  Physician Assistants and Nurse Practitioners) who all work together to provide you with the care you need, when you need it.  We recommend signing up for the patient portal called "MyChart".  Sign up information is provided on this After Visit Summary.  MyChart is used to connect with patients for Virtual Visits (Telemedicine).  Patients are able to view lab/test results, encounter notes, upcoming appointments, etc.  Non-urgent messages can be sent to your provider as well.   To learn more about what you can do with MyChart, go to [email or web address].    Your next appointment:   4 month(s)  The format for your next appointment:   In Person  Provider:   Shelva Majestic, MD

## 2021-06-14 NOTE — Progress Notes (Signed)
Patient ID: [name], male   DOB: Aug 13, 1932, 87 y.o.   MRN: [id number]    HPI: Daryl Joseph is a 87 y.o. male who presents to the office today for an 8 month cardiology evaluation.  Daryl Joseph has established CAD and in 1999 underwent CABG surgery with a LIMA to the LAD, vein graft to diagonal, sequential vein graft to the obtuse marginal distal circumflex coronary artery and vein graft to the RCA. 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 In November 2013 he suffered a non-ST segment elevation myocardial infarction and underwent initial catheterization by [name]. I ultimately performed intervention a high-grade ulcerated plaque graft stenosis supplying a diagonal vessel on 06/25/2012 and a 3.5x33 mm  Xience Xpedition DES stent was inserted and post dilated 3.9 mm. Once his graft was opened, the proximal LAD was now significantly improved with reference to being filled from the diagonal vessel.    On a one year followup nuclear perfusion study he had held his metoprolol, amlodipine and isosorbide for the stress test. He did not develop any chest pain. However, he did have an exaggerated hypertensive blood pressure response to exercise and developed 3 mm ST segment depression. Scintigraphic images were low risk and specifically he had normal flow in the diagonal vessel territory.   In December 2014, he was concerned about the price of Brilinta.  Since his been 13 months since his ACS intervention, I switched him to Plavix and P2Y12 testing verifiend plavix sensitivity.   He has been on fenofibrate and Zetia for hyperlipidemia in light of his statin hypersensitivity.  Laboratory in March 2015 revealed a cholesterol was 177, triglycerides 68, HDL 68, LDL 95.  Hemoglobin A1c was 7.0.  He went 21, creatinine 0.88.  Normal LFTs.  He brought with him.  Laboratory which was done in [place], [place] on  04/18/2014.  I reviewed this in detail with him.  He had normal thyroid function studies.  Hemoglobin 14.8, hematocrit 44.4.  Glucose was 142.  LDL cholesterol was 81 with total cholesterol 165, HDL 67, triglycerides 83.  Hemoglobin A1c was increased at 7.4.  PSA was 2.32.  When I saw him in December 2015 he complained of developing similar chest tightness with walking over several weeks, which was somewhat nitrate responsive.  He underwent repeat cardiac catheterization on 08/01/2014 which showedLow normal LV function with an ejection fraction of 50-55% and mild distal inferior hypocontractility. There was severe native CAD with 80% distal left main stenosis and total occlusion of the LAD in its ostium, 95% stenosis in the OM 2 vessel the circumflex with 30% narrowing in the OM1 branch proximal to the graft anastomoses, and total occlusion of the mid RCA. He had a patent LIMA graft supplying the mid LAD with 30-40% smooth mid LAD stenosis beyond the anastomosis., a widely patent stent in the mid distal aspect of the SVG supplying the diagonal vessel without evidence for restenosis., a patent sequential vein graft supplying the OM1 and OM 2 vessel with smooth 20% narrowing in the proximal portion of the sequential limb just beyond the OM1 anastomosis and a patent SVG supplying a dominant RCA with 80% mid PDA stenosis and a small PDA caliber vessel.  Increased medical therapy was recommended with titration of his nitrates and beta blocker and possible initiation of Ranexa.  Since I saw him, Daryl Joseph has felt well without recurrent anginal symptomatology or significant  shortness of breath on his increased medical regimen.  He denies recurrent anginal symptoms.  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  He has GERD and his Prilosec was changed to Protonix due to potential Plavix interaction.  He denies any presyncope or syncope, PND, orthopnea.    An echo Doppler study in  August 2017 to further evaluate his cardiac murmur demonstrated normal systolic function with grade 1 diastolic dysfunction.  There was mild aortic sclerosis.  Mean gradient 5 peak gradient 11.  There was mild Daryl.  Had normal pulmonic pressures.  There was mild TR.    He underwent laboratory by his primary physician in November 2017.  Glucose was increased at 146.  BUN was 20 and creatinine 0.96.  Liver function studies were normal.  TSH was normal at 1.45.  CBC was normal.  Lipid studies revealed a total cholesterol 159, triglycerides 65, HDL 73, and LDL 73.  Hemoglobin A1c was minimally increased at 6.6.  PSA was normal.    I saw him in February 2019.  At that time, he was having issues with bursitis and arthritis of his hip and has undergone 3 steroid injections.   Laboratory in November 2018 was done at the time he was having steroid injections which may have contributed to his glucose being elevated at 142, and hemoglobin A1c increased at 7.6.  He has continued to be on Zetia for hyperlipidemia, and remotely had LFT elevation with Mevacor.  Lipid studies were very good on Zetia with a total cholesterol 156, triglycerides 54, HDL 79, and LDL 66.  He normal LFTs and renal function.    His follow-up appointment had been rescheduled due to the [place] pandemic.  I  saw him in August 2020 after a 22-monthinterval.  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  Glucose was 134.  He had normal renal function with a creatinine of 0.98.  LFTs were normal.  Hemoglobin A1c was 7.0.  He was on amlodipine 10 mg, benazepril 40 mg, isosorbide 60 mg, and Toprol-XL 75 mg.  His blood pressure at home typically is in the 130s.  When his laboratory was checked, blood pressure was 138/63 in June at his primary care office.  He was on  Zetia and fenofibrate for hyperlipidemia, metformin 500 mg twice a day for diabetes mellitus and pantoprazole for GERD. At times he has noticed some intermittent ankle swelling. He tries to watch his sodium intake but at times he does get some excess sodium in certain foods.    When I saw him in July 2021 he continued to be stable from a cardiac standpoint.  Unfortunately he had a  right [place] staph infection [place]  At that time he had an echo Doppler done and this apparently was without significant abnormality.  He has had issues with an enlarged prostate and [place].  He denied anginal symptoms, PND, or orthopnea.  At times he notes mild ankle swelling.    I last saw him on October 17, 2020 and prior to that evaluation he had a COVID infection 2 months previously with symptoms of cough and runny nose.  Subsequently he did develop some vague chest tightness and pressure.  He was evaluated in the emergency room on October 08, 2020.  Blood pressure was elevated.  Troponins were negative.  Subsequently, he continued to experience episodes of chest pressure and tightness which mimic his previous anginal symptomatology when he was found to have high-grade stenosis in his vein graft.  He was worked into my schedule  for further evaluation.  With his recurrent symptomatology, now 23 years status post CABG revascularization surgery I recommended definitive repeat cardiac catheterization which was done by me on October 23, 2020.  Catheterization revealed severe multivessel native CAD with 95% distal left main stenosis, total ostial occlusion of the LAD, 80% stenosis in a small ramus vessel, 40 to 50% proximal circumflex stenosis filling to the marginals and severely diseased proximal to mid RCA with subtotal/total distal occlusion.  His LIMA supplying the LAD was patent.  The sequential vein graft supplying the OM1  and distal circumflex had mild 20% focal narrowing in the proximal third of the graft but had 75% eccentric stenosis of the sequential limb of the graft immediately after the OM1 takeoff.  The saphenous vein graft supplying the diagonal vessel had a widely patent previously placed stent in the mid distal segment but there was a new 90% smooth focal stenosis in the proximal body of the graft.  He underwent successful PCI to the proximal vein graft supplying the diagonal vessel with insertion of a 3.5 x 16 mm Synergy DES stent postdilated 3.74 mm.  Subsequent to his catheterization and intervention, he has felt well and denies any recurrent symptomatology.  He was evaluated by Sande Rives, PA-C on November 12, 2020 and remained stable.  Presently, he feels well.  He completed 12 weeks of cardiac rehab.  Since completion, he goes to the fitness center at least 3 days/week and exercises on the treadmill for at least 45 minutes.  He feels significantly improved since insertion of his last stent.  He continues to be on aspirin and Plavix for DAPT.  He is on amlodipine 10 mg, isosorbide 90 mg in the morning and 60 mg at bedtime, metoprolol succinate 75 mg daily, HCTZ 12.5 mg and pantoprazole.  He presents for reevaluation.   Past Medical History:  Diagnosis Date   Anginal pain ([place])    Arthritis    "mostly in my hands" (06/25/2012)   CAD (coronary artery disease) 03/25/2007   30 d monitor - sinus rhythm w/ some PACs   DM type 2 (diabetes mellitus, type 2) ([place])    GERD (gastroesophageal reflux disease)    H/O hiatal hernia    Hyperlipidemia    Hypertension    NSTEMI (non-ST elevated myocardial infarction) ([place]) 06/18/2012   S/P angioplasty with stent, to VG -diag 1, LAD and septal perferator vessels  06/25/2012    Past Surgical History:  Procedure Laterality Date   CARDIAC CATHETERIZATION  06/25/2012   3.5 x 59m Xience Xpedition DES inserted in distal third of LAD   CARDIOVASCULAR STRESS TEST   11/05/2010   R/S MV - EF 73%; post stress LV fcn normal, no significant wall motion abnormalities; perfusion defect in inferior myocardial region consistent w/ diaphragmatic attenuation, remaining myocardium demonstrates normal perfusion; Exercise capacity 10 METS; stress EKG showed changes from baseline EKG; study results unchanged/within normal variance of last study (09/2008)   CERVICAL DISC SURGERY  1990's   CORONARY ANGIOPLASTY WITH STENT PLACEMENT  06/25/2012   "1; first one for me" (06/25/2012)  CORONARY ARTERY BYPASS GRAFT  10/1997   CABG X 4 LIMA to LAD; vein to diagonal; sequential vein to obtuse marginal and distal circumflex and vein to RCA   CORONARY STENT INTERVENTION  10/23/2020   CORONARY STENT INTERVENTION N/A 10/23/2020   Procedure: CORONARY STENT INTERVENTION;  Surgeon: Troy Sine, MD;  Location: [facility];  Service: Cardiovascular;  Laterality: N/A;   DOPPLER ECHOCARDIOGRAPHY  09/23/2011   EF >55%; proximal septal thickening noted; mild septal hypokinesis; ; mild/mod tricuspid regurgitation; aortic root sclerosis/calcification   LEFT HEART CATH AND CORS/GRAFTS ANGIOGRAPHY N/A 10/23/2020   Procedure: LEFT HEART CATH AND CORS/GRAFTS ANGIOGRAPHY;  Surgeon: Troy Sine, MD;  Location: [facility];  Service: Cardiovascular;  Laterality: N/A;   LEFT HEART CATHETERIZATION WITH CORONARY/GRAFT ANGIOGRAM N/A 06/21/2012   Procedure: LEFT HEART CATHETERIZATION WITH Beatrix Fetters;  Surgeon: Sanda Klein, MD;  Location: [facility];  Service: Cardiovascular;  Laterality: N/A;   LEFT HEART CATHETERIZATION WITH CORONARY/GRAFT ANGIOGRAM N/A 08/01/2014   Procedure: LEFT HEART CATHETERIZATION WITH Beatrix Fetters;  Surgeon: Troy Sine, MD;  Location: [facility];  Service: Cardiovascular;  Laterality: N/A;   PERCUTANEOUS CORONARY STENT INTERVENTION (PCI-S) N/A 06/25/2012   Procedure: PERCUTANEOUS CORONARY STENT INTERVENTION (PCI-S);  Surgeon: Troy Sine, MD;  Location[facility];  Service: Cardiovascular;  Laterality: N/A;   TONSILLECTOMY AND ADENOIDECTOMY     "when I was a kid" (06/25/2012)    Allergies  Allergen Reactions   Statins Other (See Comments)    Myositis with statins "affected my liver; I almost had Hepatitis" (06/25/2012)   Welchol [Colesevelam] Other (See Comments)    unknown    Current Outpatient Medications  Medication Sig Dispense Refill   amLODipine (NORVASC) 10 MG tablet TAKE 1 TABLET BY MOUTH  DAILY (Patient taking differently: Take 10 mg by mouth daily.) 90 tablet 3   aspirin 81 MG tablet Take 81 mg by mouth daily.     benazepril (LOTENSIN) 40 MG tablet TAKE 1 TABLET BY MOUTH  DAILY (Patient taking differently: Take 40 mg by mouth daily.) 90 tablet 3   clopidogrel (PLAVIX) 75 MG tablet TAKE 1 TABLET BY MOUTH  DAILY (Patient taking differently: Take 75 mg by mouth daily.) 90 tablet 3   ezetimibe (ZETIA) 10 MG tablet TAKE 1 TABLET BY MOUTH AT  BEDTIME 90 tablet 3   fenofibrate 160 MG tablet TAKE 1 TABLET BY MOUTH  DAILY (Patient taking differently: Take 160 mg by mouth daily.) 30 tablet 11   folic acid (FOLVITE) 1 MG tablet Take one tablet by mouth daily (Patient taking differently: Take 1 mg by mouth daily.) 90 tablet 1   Glycerin-Hypromellose-PEG 400 (DRY EYE RELIEF DROPS) 0.2-0.2-1 % SOLN Place 1 drop into both eyes daily.     hydrochlorothiazide (HYDRODIURIL) 25 MG tablet Take 1 tablet (25 mg total) by mouth daily. 90 tablet 3   metFORMIN (GLUCOPHAGE) 500 MG tablet Take 500 mg by mouth 2 (two) times daily with a meal.      metoprolol succinate (TOPROL-XL) 50 MG 24 hr tablet Take 1.5 tablets (75 mg total) by mouth daily. 135 tablet 1   Multiple Vitamins-Minerals (EYE VITAMINS PO) Take 1 tablet by mouth every evening.     nitroGLYCERIN (NITROSTAT) 0.4 MG SL tablet Place 1 tablet under the tongue every 5 minutes as needed for chest pain (Patient taking differently: Place 0.4 mg under the tongue every 5 (five)  minutes as needed for chest pain.) 25  tablet 3   pantoprazole (PROTONIX) 40 MG tablet Take 40 mg by mouth daily.     isosorbide mononitrate (IMDUR) 60 MG 24 hr tablet Take 2.5 tablets (150 mg total) by mouth daily. Take 90 mg(1 1/2 tablet) in the morning and 60 mg (1 tablet) at bedtime 225 tablet 3   No current facility-administered medications for this visit.    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 He is married with no children. He does not routinely exercise. There is no tobacco or alcohol use. 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  ROS General: Negative; No fevers, chills, or night sweats;  HEENT: Negative; No changes in vision or hearing, sinus congestion, difficulty swallowing Pulmonary: Negative; No cough, wheezing, shortness of breath, hemoptysis Cardiovascular: See history of present illness GI: Negative; No nausea, vomiting, diarrhea, or abdominal pain GU: Positive for history of BPH; he underwent UroLift implant Musculoskeletal: Positive for bilateral hip bursitis and arthritis Hematologic/Oncology: Positive for easy bruisability secondary to antiplatelet therapy Endocrine: Positive for type 2 diabetes mellitus; no heat/cold intolerance;  Neuro: Negative; no changes in balance, headaches Skin: Negative; No rashes or skin lesions Psychiatric: Negative; No behavioral problems, depression Sleep: Negative; No snoring, daytime sleepiness, hypersomnolence, bruxism, restless legs, hypnogognic hallucinations, no cataplexy Other comprehensive 14 point system review is negative.   PE BP 134/72   Pulse 68   Ht _0  (1.803 m)   Wt 156 lb (70.8 kg)   SpO2 98%   BMI 21.76 kg/m    Repeat blood pressure by me was 130/70  Wt Readings from Last 3 Encounters:  06/14/21 156 lb (70.8 kg)  11/12/20 155 lb 6.4 oz (70.5 kg)  10/25/20 146 lb 4.8 oz (66.4 kg)   General: Alert, oriented, no distress.  Skin: normal turgor, no rashes, warm and  dry HEENT: Normocephalic, atraumatic. Pupils equal round and reactive to light; sclera anicteric; extraocular muscles intact;  Nose without nasal septal hypertrophy Mouth/Parynx benign; Mallinpatti scale 3 Neck: No JVD, no carotid bruits; normal carotid upstroke Lungs: clear to ausculatation and percussion; no wheezing or rales Chest wall: without tenderness to palpitation Heart: PMI not displaced, RRR, s1 s2 normal, 1/6 systolic murmur, no diastolic murmur, no rubs, gallops, thrills, or heaves Abdomen: soft, nontender; no hepatosplenomehaly, BS+; abdominal aorta nontender and not dilated by palpation. Back: Lipoma on left side of back; no CVA tenderness Pulses 2+ Musculoskeletal: full range of motion, normal strength, no joint deformities Extremities: no clubbing cyanosis or edema, Homan's sign negative  Neurologic: grossly nonfocal; Cranial nerves grossly wnl Psychologic: Normal mood and affect   June 14, 2021 ECG (independently read by me): Sinus rhythm at 68, 1st degree AV block; PR 296 msec  October 17, 2020 ECG (independently read by me): Sinus rhythm at 60; 1st degree AV block  July 2021 ECG (independently read by me): NSR at 63; Ist degree AV block; PR 244 msec; no ST changes  February 2019 ECG (independently read by me): Normal sinus rhythm at 68 bpm; first-degree AV block with a PR interval of 250 ms.  No ectopy.  Nonspecific ST changes.  January 2018 ECG (independently read by me): Normal sinus rhythm at 70 bpm with first-degree AV block with a PR interval at 270 ms.  Nonspecific T changes  October 2016 ECG (independently read by me): Normal sinus rhythm with mild sinus arrhythmia at 67 bpm.  First-degree AV block with a PR interval at 238 ms.  December 2015  ECG (independently read by me): Sinus rhythm with first-degree AV block with a PR interval at 244 ms.  No significant ST segment changes.  Prior June 2015 ECG: Normal sinus rhythm at 63 beats per minute.  First degree  AV block with PR interval at 236 ms.  No significant ST changes.  LABS: Reviewed his recent emergency room evaluation and laboratory from October 08, 2020  I reviewed laboratory from 07/02/2017.  Hemoglobin 14.1, hematocrit 42.6.  Glucose 142, BUN 19, creatinine 0.96.  Normal LFTs.  Hemoglobin A1c 7.6.  PSA 2.6. Total cholesterol 156, TG 54, HDL 79, VLDL 11, LDL 66.  I reviewed blood work from 12/17/2015 done in [place], [place].   Glucose was 138.  BUN 17, creatinine 0.91.  LFTs normal. Lipid studies revealed cholesterol 150, HDL 66, triglycerides 78, LDL 68,  HbA1c 7.0  November 2017.  Glucose was increased at 146.  BUN was 20 and creatinine 0.96.  Liver function studies were normal.  TSH was normal at 1.45.  CBC was normal.  Lipid studies revealed a total cholesterol 159, triglycerides 65, HDL 73, and LDL 73.  Hemoglobin A1c was minimally increased at 6.6.  PSA was normal.  BMP Latest Ref Rng & Units 10/25/2020 10/24/2020 10/19/2020  Glucose 70 - 99 mg/dL 113(H) 161(H) 158(H)  BUN 8 - 23 mg/dL _0 Creatinine 0.61 - 1.24 mg/dL 1.06 0.96 1.08  BUN/Creat Ratio 10 - 24 - - 14  Sodium 135 - 145 mmol/L 138 141 142  Potassium 3.5 - 5.1 mmol/L 3.5 3.6 4.7  Chloride 98 - 111 mmol/L 109 110 105  CO2 22 - 32 mmol/L _1 Calcium 8.9 - 10.3 mg/dL 8.5(L) 8.9 9.8   Hepatic Function Latest Ref Rng & Units 07/31/2014 06/21/2012  Total Protein 6.0 - 8.3 g/dL 7.0 6.7  Albumin 3.5 - 5.2 g/dL 4.7 3.5  AST 0 - 37 U/L 18 34  ALT 0 - 53 U/L 20 27  Alk Phosphatase 39 - 117 U/L 50 65  Total Bilirubin 0.2 - 1.2 mg/dL 0.5 0.4  Bilirubin, Direct 0.0 - 0.3 mg/dL - <0.1   CBC Latest Ref Rng & Units 10/24/2020 10/19/2020 10/08/2020  WBC 4.0 - 10.5 K/uL 8.9 5.5 6.2  Hemoglobin 13.0 - 17.0 g/dL 11.5(L) 13.7 13.3  Hematocrit 39.0 - 52.0 % 33.0(L) 41.0 40.4  Platelets 150 - 400 K/uL 181 192 208   Lab Results  Component Value Date   MCV 86.2 10/24/2020   MCV 89 10/19/2020   MCV 89.8 10/08/2020    Lab Results  Component Value Date   TSH 1.168 07/31/2014   Lab Results  Component Value Date   HGBA1C 7.5 (H) 10/23/2020   Lipid Panel     Component Value Date/Time   CHOL 133 10/25/2020 0206   TRIG 80 10/25/2020 0206   HDL 54 10/25/2020 0206   CHOLHDL 2.5 10/25/2020 0206   VLDL 16 10/25/2020 0206   LDLCALC 63 10/25/2020 0206    RADIOLOGY: No results found.  IMPRESSION:  1. CAD in native artery: s/p PCIs to SVG - Dx:  2013 and 2022   2. Hx of CABG   3. Essential hypertension   4. Hyperlipidemia LDL goal <70   5. Need for immunization against influenza   6. COVID-19: January 2022   7. Lower extremity edema   8. Lipoma of torso   9. Type 2 diabetes mellitus with complication, without long-term current use of insulin ([place])   10.  Statin intolerance     ASSESSMENT AND PLAN: Daryl Joseph is a young appearing 87 year-old white male who is status post CABG revascularization surgery in 1999. 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  He was transitioned off Brilinta to Plavix and had verification of Plavix sensitivity by P2Y12 testing.  In December 2015 repeat catheterization showed severe native CAD with patent grafts.  He  had complete resolution of symptomatology with his increased nitrates and beta blocker therapy.  In the past, he developed marked LFT elevation with Mevacor in the 1980s and almost underwent liver biopsy.  He has not been on statin since that time.  In 2017 an echo Doppler study for evaluation of a cardiac murmur demonstrated normal LV function with grade 1 diastolic dysfunction.  There was mild Daryl and normal PA pressures.  There was no evidence for aortic stenosis.  When I saw him in March 2022 he was experiencing recurrent similar anginal symptomatology.  Repeat catheterization was performed and he was found to  have a new high-grade stenosis in the proximal LAD and of the graft supplying the diagonal vessel with the previously placed stent in the distal portion of the graft remaining widely open.  He also had 75% stenosis in the sequential limb of the vein graft between the OM1 and OM 2 vessel which was treated medically.  His LIMA to LAD was patent as was his SVG to RCA.  Since undergoing successful intervention, he has had complete resolution of prior symptomatology.  I am renewing his medical regimen.  In the office today he also will get a flu shot.  He has noted some increasing lower extremity edema.  I have suggested he increase his hydrochlorothiazide from 12.5 mg for the next 3 days take 25 mg and then 12.5 or 25 mg depending upon leg swelling on a daily basis.  Most recent lipid panel was excellent with an LDL of 63 on Zetia 10 mg.  He will continue his amlodipine 10 mg HCTZ, isosorbide, and metoprolol succinate 75 mg daily.  He is diabetic on metformin 500 mg twice a day. I will see him in 4 to 6 months for follow-up evaluation.   Troy Sine, MD, [facility]  06/21/2021 6:25 PM

## 2021-06-21 ENCOUNTER — Encounter: Payer: Self-pay | Admitting: Cardiovascular Disease

## 2021-07-02 ENCOUNTER — Other Ambulatory Visit: Payer: Self-pay | Admitting: Cardiovascular Disease

## 2021-07-05 ENCOUNTER — Other Ambulatory Visit: Payer: Self-pay | Admitting: Cardiovascular Disease

## 2021-09-04 ENCOUNTER — Other Ambulatory Visit: Payer: Self-pay | Admitting: Cardiovascular Disease

## 2021-09-18 ENCOUNTER — Other Ambulatory Visit: Payer: Self-pay | Admitting: Cardiovascular Disease

## 2021-10-23 ENCOUNTER — Encounter: Payer: Self-pay | Admitting: Cardiovascular Disease

## 2021-10-23 ENCOUNTER — Other Ambulatory Visit: Payer: Self-pay

## 2021-10-23 ENCOUNTER — Ambulatory Visit: Payer: Medicare Other | Admitting: Cardiovascular Disease

## 2021-10-23 VITALS — BP 132/60 | HR 65 | Ht 71.0 in | Wt 157.2 lb

## 2021-10-23 DIAGNOSIS — Z789 Other specified health status: Secondary | ICD-10-CM

## 2021-10-23 DIAGNOSIS — I350 Nonrheumatic aortic (valve) stenosis: Secondary | ICD-10-CM

## 2021-10-23 DIAGNOSIS — D171 Benign lipomatous neoplasm of skin and subcutaneous tissue of trunk: Secondary | ICD-10-CM

## 2021-10-23 DIAGNOSIS — E785 Hyperlipidemia, unspecified: Secondary | ICD-10-CM

## 2021-10-23 DIAGNOSIS — I2510 Atherosclerotic heart disease of native coronary artery without angina pectoris: Secondary | ICD-10-CM

## 2021-10-23 DIAGNOSIS — Z951 Presence of aortocoronary bypass graft: Secondary | ICD-10-CM | POA: Diagnosis not present

## 2021-10-23 DIAGNOSIS — E118 Type 2 diabetes mellitus with unspecified complications: Secondary | ICD-10-CM

## 2021-10-23 DIAGNOSIS — U071 COVID-19: Secondary | ICD-10-CM

## 2021-10-23 NOTE — Patient Instructions (Signed)
Medication Instructions:  Your physician recommends that you continue on your current medications as directed. Please refer to the Current Medication list given to you today.  *If you need a refill on your cardiac medications before your next appointment, please call your pharmacy*   Follow-Up: At [facility], you and your health needs are our priority.  As part of our continuing mission to provide you with exceptional heart care, we have created designated Provider Care Teams.  These Care Teams include your primary Cardiologist (physician) and Advanced Practice Providers (APPs -  Physician Assistants and Nurse Practitioners) who all work together to provide you with the care you need, when you need it.  We recommend signing up for the patient portal called "MyChart".  Sign up information is provided on this After Visit Summary.  MyChart is used to connect with patients for Virtual Visits (Telemedicine).  Patients are able to view lab/test results, encounter notes, upcoming appointments, etc.  Non-urgent messages can be sent to your provider as well.   To learn more about what you can do with MyChart, go to [email or web address].    Your next appointment:   6 month(s)  The format for your next appointment:   In Person  Provider:   Thomas Kelly, MD  

## 2021-10-23 NOTE — Progress Notes (Deleted)
Patient ID: Daryl Joseph, male   DOB: 01-06-1933, 88 y.o.   MRN: [id number] ? ? ? ?HPI: Daryl Joseph is a 88 y.o. male who presents to the office today for an 8 month cardiology evaluation. ? ?Daryl Joseph has established CAD and in 1999 underwent CABG surgery with a LIMA to the LAD, vein graft to diagonal, sequential vein graft to the obtuse marginal distal circumflex coronary artery and vein graft to the RCA. 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 In November 2013 he suffered a non-ST segment elevation myocardial infarction and underwent initial catheterization by [name]. I ultimately performed intervention a high-grade ulcerated plaque graft stenosis supplying a diagonal vessel on 06/25/2012 and a 3.5x33 mm  Xience Xpedition DES stent was inserted and post dilated 3.9 mm. Once his graft was opened, the proximal LAD was now significantly improved with reference to being filled from the diagonal vessel.   ? ?On a one year followup nuclear perfusion study he had held his metoprolol, amlodipine and isosorbide for the stress test. He did not develop any chest pain. However, he did have an exaggerated hypertensive blood pressure response to exercise and developed 3 mm ST segment depression. Scintigraphic images were low risk and specifically he had normal flow in the diagonal vessel territory.  ? ?In December 2014, he was concerned about the price of Brilinta.  Since his been 13 months since his ACS intervention, I switched him to Plavix and P2Y12 testing verifiend plavix sensitivity.   He has been on fenofibrate and Zetia for hyperlipidemia in light of his statin hypersensitivity. ? ?Laboratory in March 2015 revealed a cholesterol was 177, triglycerides 68, HDL 68, LDL 95.  Hemoglobin A1c was 7.0.  He went 21, creatinine 0.88.  Normal LFTs.  He brought with him.  Laboratory which was done in [place], [place] on  04/18/2014.  I reviewed this in detail with him.  He had normal thyroid function studies.  Hemoglobin 14.8, hematocrit 44.4.  Glucose was 142.  LDL cholesterol was 81 with total cholesterol 165, HDL 67, triglycerides 83.  Hemoglobin A1c was increased at 7.4.  PSA was 2.32. ? ?When I saw him in December 2015 he complained of developing similar chest tightness with walking over several weeks, which was somewhat nitrate responsive.  He underwent repeat cardiac catheterization on 08/01/2014 which showedLow normal LV function with an ejection fraction of 50-55% and mild distal inferior hypocontractility. There was severe native CAD with 80% distal left main stenosis and total occlusion of the LAD in its ostium, 95% stenosis in the OM 2 vessel the circumflex with 30% narrowing in the OM1 branch proximal to the graft anastomoses, and total occlusion of the mid RCA. He had a patent LIMA graft supplying the mid LAD with 30-40% smooth mid LAD stenosis beyond the anastomosis., a widely patent stent in the mid distal aspect of the SVG supplying the diagonal vessel without evidence for restenosis., a patent sequential vein graft supplying the OM1 and OM 2 vessel with smooth 20% narrowing in the proximal portion of the sequential limb just beyond the OM1 anastomosis and a patent SVG supplying a dominant RCA with 80% mid PDA stenosis and a small PDA caliber vessel.  Increased medical therapy was recommended with titration of his nitrates and beta blocker and possible initiation of Ranexa. ? ?Since I last saw him, Daryl Joseph has felt well without recurrent anginal symptomatology or  significant shortness of breath on his increased medical regimen.  He denies recurrent anginal symptoms.  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  He has GERD and his Prilosec was changed to Protonix due to potential Plavix interaction.  He denies any presyncope or syncope, PND, orthopnea.   ? ?An echo Doppler study  in August 2017 to further evaluate his cardiac murmur demonstrated normal systolic function with grade 1 diastolic dysfunction.  There was mild aortic sclerosis.  Mean gradient 5 peak gradient 11.  There was mild Daryl.  Had normal pulmonic pressures.  There was mild TR.   ? ?He underwent laboratory by his primary physician in November 2017.  Glucose was increased at 146.  BUN was 20 and creatinine 0.96.  Liver function studies were normal.  TSH was normal at 1.45.  CBC was normal.  Lipid studies revealed a total cholesterol 159, triglycerides 65, HDL 73, and LDL 73.  Hemoglobin A1c was minimally increased at 6.6.  PSA was normal.   ? ?I last saw him in February 2019.  At that time, he was having issues with bursitis and arthritis of his hip and has undergone 3 steroid injections.   Laboratory in November 2018 was done at the time he was having steroid injections which may have contributed to his glucose being elevated at 142, and hemoglobin A1c increased at 7.6.  He has continued to be on Zetia for hyperlipidemia, and remotely had LFT elevation with Mevacor.  Lipid studies were very good on Zetia with a total cholesterol 156, triglycerides 54, HDL 79, and LDL 66.  He normal LFTs and renal function.   ? ?His follow-up appointment had been rescheduled due to the [place] pandemic.  I  saw him in August 2020 after a 32-monthinterval.  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  Glucose was 134.  He had normal renal function with a creatinine of 0.98.  LFTs were normal.  Hemoglobin A1c was 7.0.  He was on amlodipine 10 mg, benazepril 40 mg, isosorbide 60 mg, and Toprol-XL 75 mg.  His blood pressure at home typically is in the 130s.  When his laboratory was checked, blood pressure was 138/63 in June at his primary care office.   He was on Zetia and fenofibrate for hyperlipidemia, metformin 500 mg twice a day for diabetes mellitus and pantoprazole for GERD. At times he has noticed some intermittent ankle swelling. He tries to watch his sodium intake but at times he does get some excess sodium in certain foods.   ? ?Last saw him in July 2021 and since his prior evaluation he continues to be stable from a cardiac standpoint.  Unfortunately he had a  [place] staph [place]  At that time he had an echo Doppler done and this apparently was without significant abnormality.  He has had issues with an enlarged prostate [place].  He denied anginal symptoms, PND, or orthopnea.  At times he notes mild ankle swelling.   ? ?Daryl. CCarlis Abbottstates that he suffered Covid infection over 8 weeks ago with symptoms of cough and runny nose.  Over the  past 2 weeks, he had developed episodic chest tightness and pressure.  He was evaluated in the emergency room on October 08, 2020.  Blood pressure was elevated.  Troponins were negative.  Subsequently, he has continued to experience episodes of chest pressure and tightness which mimic his previous anginal symptomatology when he was found to have high-grade stenosis in his vein graft.  He was worked into my schedule today for further evaluation. ? ? ?Past Medical History:  ?Diagnosis Date  ? Anginal pain ([place])   ? Arthritis   ? "mostly in my hands" (06/25/2012)  ? CAD (coronary artery disease) 03/25/2007  ? 30 d monitor - sinus rhythm w/ some PACs  ? DM type 2 (diabetes mellitus, type 2) ([place])   ? GERD (gastroesophageal reflux disease)   ? H/O hiatal hernia   ? Hyperlipidemia   ? Hypertension   ? NSTEMI (non-ST elevated myocardial infarction) ([place]) 06/18/2012  ? S/P angioplasty with stent, to VG -diag 1, LAD and septal perferator vessels  06/25/2012  ? ? ?Past Surgical History:  ?Procedure Laterality  Date  ? CARDIAC CATHETERIZATION  06/25/2012  ? 3.5 x 82m Xience Xpedition DES inserted in distal third of LAD  ? CARDIOVASCULAR STRESS TEST  11/05/2010  ? R/S MV - EF 73%; post stress LV fcn normal, no

## 2021-10-25 ENCOUNTER — Encounter: Payer: Self-pay | Admitting: Cardiovascular Disease

## 2021-10-25 NOTE — Progress Notes (Signed)
Patient ID: Daryl Joseph, male   DOB: June 08, 1933, 88 y.o.   MRN: [id number] ? ? ? ?HPI: Daryl Joseph is a 88 y.o. male who presents to the office today for an 8 month cardiology evaluation. ? ?Daryl Joseph has established CAD and in 1999 underwent CABG surgery with a LIMA to the LAD, vein graft to diagonal, sequential vein graft to the obtuse marginal distal circumflex coronary artery and vein graft to the RCA. 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 In November 2013 he suffered a non-ST segment elevation myocardial infarction and underwent initial catheterization by [name]. I ultimately performed intervention a high-grade ulcerated plaque graft stenosis supplying a diagonal vessel on 06/25/2012 and a 3.5x33 mm  Xience Xpedition DES stent was inserted and post dilated 3.9 mm. Once his graft was opened, the proximal LAD was now significantly improved with reference to being filled from the diagonal vessel.   ? ?On a one year followup nuclear perfusion study he had held his metoprolol, amlodipine and isosorbide for the stress test. He did not develop any chest pain. However, he did have an exaggerated hypertensive blood pressure response to exercise and developed 3 mm ST segment depression. Scintigraphic images were low risk and specifically he had normal flow in the diagonal vessel territory.  ? ?In December 2014, he was concerned about the price of Brilinta.  Since his been 13 months since his ACS intervention, I switched him to Plavix and P2Y12 testing verifiend plavix sensitivity.   He has been on fenofibrate and Zetia for hyperlipidemia in light of his statin hypersensitivity. ? ?Laboratory in March 2015 revealed a cholesterol was 177, triglycerides 68, HDL 68, LDL 95.  Hemoglobin A1c was 7.0.  He went 21, creatinine 0.88.  Normal LFTs.  He brought with him.  Laboratory which was done in [place], [place] on  04/18/2014.  I reviewed this in detail with him.  He had normal thyroid function studies.  Hemoglobin 14.8, hematocrit 44.4.  Glucose was 142.  LDL cholesterol was 81 with total cholesterol 165, HDL 67, triglycerides 83.  Hemoglobin A1c was increased at 7.4.  PSA was 2.32. ? ?When I saw him in December 2015 he complained of developing similar chest tightness with walking over several weeks, which was somewhat nitrate responsive.  He underwent repeat cardiac catheterization on 08/01/2014 which showedLow normal LV function with an ejection fraction of 50-55% and mild distal inferior hypocontractility. There was severe native CAD with 80% distal left main stenosis and total occlusion of the LAD in its ostium, 95% stenosis in the OM 2 vessel the circumflex with 30% narrowing in the OM1 branch proximal to the graft anastomoses, and total occlusion of the mid RCA. He had a patent LIMA graft supplying the mid LAD with 30-40% smooth mid LAD stenosis beyond the anastomosis., a widely patent stent in the mid distal aspect of the SVG supplying the diagonal vessel without evidence for restenosis., a patent sequential vein graft supplying the OM1 and OM 2 vessel with smooth 20% narrowing in the proximal portion of the sequential limb just beyond the OM1 anastomosis and a patent SVG supplying a dominant RCA with 80% mid PDA stenosis and a small PDA caliber vessel.  Increased medical therapy was recommended with titration of his nitrates and beta blocker and possible initiation of Ranexa. ? ?Since I saw him, Daryl Joseph has felt well without recurrent anginal symptomatology or significant  shortness of breath on his increased medical regimen.  He denies recurrent anginal symptoms.  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  He has GERD and his Prilosec was changed to Protonix due to potential Plavix interaction.  He denies any presyncope or syncope, PND, orthopnea.   ? ?An echo Doppler study in  August 2017 to further evaluate his cardiac murmur demonstrated normal systolic function with grade 1 diastolic dysfunction.  There was mild aortic sclerosis.  Mean gradient 5 peak gradient 11.  There was mild Daryl.  Had normal pulmonic pressures.  There was mild TR.   ? ?He underwent laboratory by his primary physician in November 2017.  Glucose was increased at 146.  BUN was 20 and creatinine 0.96.  Liver function studies were normal.  TSH was normal at 1.45.  CBC was normal.  Lipid studies revealed a total cholesterol 159, triglycerides 65, HDL 73, and LDL 73.  Hemoglobin A1c was minimally increased at 6.6.  PSA was normal.   ? ?I saw him in February 2019.  At that time, he was having issues with bursitis and arthritis of his hip and has undergone 3 steroid injections.   Laboratory in November 2018 was done at the time he was having steroid injections which may have contributed to his glucose being elevated at 142, and hemoglobin A1c increased at 7.6.  He has continued to be on Zetia for hyperlipidemia, and remotely had LFT elevation with Mevacor.  Lipid studies were very good on Zetia with a total cholesterol 156, triglycerides 54, HDL 79, and LDL 66.  He normal LFTs and renal function.   ? ?His follow-up appointment had been rescheduled due to the [place] pandemic.  I  saw him in August 2020 after a 20-monthinterval.  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  Glucose was 134.  He had normal renal function with a creatinine of 0.98.  LFTs were normal.  Hemoglobin A1c was 7.0.  He was on amlodipine 10 mg, benazepril 40 mg, isosorbide 60 mg, and Toprol-XL 75 mg.  His blood pressure at home typically is in the 130s.  When his laboratory was checked, blood pressure was 138/63 in June at his primary care office.  He was on  Zetia and fenofibrate for hyperlipidemia, metformin 500 mg twice a day for diabetes mellitus and pantoprazole for GERD. At times he has noticed some intermittent ankle swelling. He tries to watch his sodium intake but at times he does get some excess sodium in certain foods.   ? ?When I saw him in July 2021 he continued to be stable from a cardiac standpoint.  Unfortunately he had a  right knee staph infection [place]  At that time he had an echo Doppler done and this apparently was without significant abnormality.  He has had issues with an enlarged prostate and [place].  He denied anginal symptoms, PND, or orthopnea.  At times he notes mild ankle swelling.   ? ?I  saw him on October 17, 2020 and prior to that evaluation he had a COVID infection 2 months previously with symptoms of cough and runny nose.  Subsequently he did develop some vague chest tightness and pressure.  He was evaluated in the emergency room on October 08, 2020.  Blood pressure was elevated.  Troponins were negative.  Subsequently, he continued to experience episodes of chest pressure and tightness which mimic his previous anginal symptomatology when he was found to have high-grade stenosis in his vein graft.  He was worked into my schedule  for further evaluation.  With his recurrent symptomatology, now 23 years status post CABG revascularization surgery I recommended definitive repeat cardiac catheterization which was done by me on October 23, 2020.  Catheterization revealed severe multivessel native CAD with 95% distal left main stenosis, total ostial occlusion of the LAD, 80% stenosis in a small ramus vessel, 40 to 50% proximal circumflex stenosis filling to the marginals and severely diseased proximal to mid RCA with subtotal/total distal occlusion.  His LIMA supplying the LAD was patent.  The sequential vein graft supplying the OM1 and  distal circumflex had mild 20% focal narrowing in the proximal third of the graft but had 75% eccentric stenosis of the sequential limb of the graft immediately after the OM1 takeoff.  The saphenous vei

## 2021-12-14 ENCOUNTER — Other Ambulatory Visit: Payer: Self-pay | Admitting: Cardiovascular Disease

## 2021-12-24 ENCOUNTER — Other Ambulatory Visit: Payer: Self-pay | Admitting: Cardiovascular Disease

## 2022-02-12 ENCOUNTER — Other Ambulatory Visit: Payer: Self-pay | Admitting: Cardiovascular Disease

## 2022-02-26 ENCOUNTER — Telehealth: Payer: Self-pay

## 2022-02-26 NOTE — Telephone Encounter (Deleted)
surg

## 2022-02-26 NOTE — Telephone Encounter (Signed)
   Pre-operative Risk Assessment    Patient Name: Daryl Joseph  DOB: [birth year] MRN: [id number]      Request for Surgical Clearance    Procedure:   No  Date of Surgery:  Clearance TBD                                 Surgeon:  Dr. Marlaine Hind Surgeon's Group or Practice Name:  [facility] Phone number:  [phone] Fax number:  [phone]   Type of Clearance Requested:   - Pharmacy:  Hold Clopidogrel (Plavix) 7 days  prior to injection.   Type of Anesthesia:  None    Additional requests/questions:  Please fax a copy of [phone] Daryl Joseph to the surgeon's office.  Signed, Daryl Joseph Daryl Joseph CCMA 02/26/2022, 2:18 PM

## 2022-03-04 NOTE — Telephone Encounter (Signed)
   Patient Name: Daryl Joseph  DOB: 09/16/1932 MRN: [id number]  Primary Cardiologist: Shelva Majestic, MD  Chart reviewed as part of pre-operative protocol coverage. Given past medical history and time since last visit, based on ACC/AHA guidelines, Lynwood F Kedzierski would be at acceptable risk for the planned procedure without further cardiovascular testing.   The patient was advised that if he develops new symptoms prior to surgery to contact our office to arrange for a follow-up visit, and he verbalized understanding  Patient advised to hold Plavix for 7 days prior to procedure and if necessary can also hold aspirin for 7 days prior with directions to restart when safe as possible post procedure.  I will route this recommendation to the requesting party via Epic fax function and remove from pre-op pool.  Please call with questions.  Mable Fill, Marissa Nestle, NP 03/04/2022, 9:57 AM

## 2022-03-11 ENCOUNTER — Telehealth: Payer: Self-pay | Admitting: Cardiovascular Disease

## 2022-03-11 NOTE — Telephone Encounter (Signed)
Clearance was completed on 02/26/22. I refaxed to the number provided.

## 2022-03-11 NOTE — Telephone Encounter (Signed)
Fllow Up:    Wife called and said surgeon's office sill have not received clearance. Please fax asap to [phone]

## 2022-03-29 ENCOUNTER — Other Ambulatory Visit: Payer: Self-pay | Admitting: Cardiovascular Disease

## 2022-03-31 ENCOUNTER — Encounter: Payer: Self-pay | Admitting: Cardiovascular Disease

## 2022-03-31 ENCOUNTER — Ambulatory Visit: Payer: Medicare Other | Attending: Cardiovascular Disease | Admitting: Cardiovascular Disease

## 2022-03-31 VITALS — BP 136/51 | HR 71 | Ht 71.0 in | Wt 156.8 lb

## 2022-03-31 DIAGNOSIS — Z951 Presence of aortocoronary bypass graft: Secondary | ICD-10-CM

## 2022-03-31 DIAGNOSIS — I2510 Atherosclerotic heart disease of native coronary artery without angina pectoris: Secondary | ICD-10-CM | POA: Diagnosis not present

## 2022-03-31 DIAGNOSIS — Z789 Other specified health status: Secondary | ICD-10-CM

## 2022-03-31 DIAGNOSIS — E118 Type 2 diabetes mellitus with unspecified complications: Secondary | ICD-10-CM

## 2022-03-31 DIAGNOSIS — R600 Localized edema: Secondary | ICD-10-CM

## 2022-03-31 DIAGNOSIS — E785 Hyperlipidemia, unspecified: Secondary | ICD-10-CM

## 2022-03-31 DIAGNOSIS — D171 Benign lipomatous neoplasm of skin and subcutaneous tissue of trunk: Secondary | ICD-10-CM

## 2022-03-31 DIAGNOSIS — U071 COVID-19: Secondary | ICD-10-CM

## 2022-03-31 NOTE — Patient Instructions (Addendum)
Medication Instructions:   May take HCTZ every other day if needed for swelling  Continue all other medications   *If you need a refill on your cardiac medications before your next appointment, please call your pharmacy*   Lab Work: None ordered   Testing/Procedures: None ordered   Follow-Up: At [facility], you and your health needs are our priority.  As part of our continuing mission to provide you with exceptional heart care, we have created designated Provider Care Teams.  These Care Teams include your primary Cardiologist (physician) and Advanced Practice Providers (APPs -  Physician Assistants and Nurse Practitioners) who all work together to provide you with the care you need, when you need it.  We recommend signing up for the patient portal called "MyChart".  Sign up information is provided on this After Visit Summary.  MyChart is used to connect with patients for Virtual Visits (Telemedicine).  Patients are able to view lab/test results, encounter notes, upcoming appointments, etc.  Non-urgent messages can be sent to your provider as well.   To learn more about what you can do with MyChart, go to [email or web address].    Your next appointment:  6 months    Call in Oct to schedule Feb appointment     The format for your next appointment: Office    Provider:  [facility]   Important Information About Sugar

## 2022-03-31 NOTE — Progress Notes (Signed)
Patient ID: Daryl Joseph, male   DOB: 1932/08/31, 88 y.o.   MRN: [id number]      HPI: Daryl Joseph is a 88 y.o. male who presents to the office today for an 8 month cardiology evaluation.  Daryl Joseph has established CAD and in 1999 underwent CABG surgery with a LIMA to the LAD, vein graft to diagonal, sequential vein graft to the obtuse marginal distal circumflex coronary artery and vein graft to the RCA. 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 In November 2013 he suffered a non-ST segment elevation myocardial infarction and underwent initial catheterization by [name]. I ultimately performed intervention a high-grade ulcerated plaque graft stenosis supplying a diagonal vessel on 06/25/2012 and a 3.5x33 mm  Xience Xpedition DES stent was inserted and post dilated 3.9 mm. Once his graft was opened, the proximal LAD was now significantly improved with reference to being filled from the diagonal vessel.    On a one year followup nuclear perfusion study he had held his metoprolol, amlodipine and isosorbide for the stress test. He did not develop any chest pain. However, he did have an exaggerated hypertensive blood pressure response to exercise and developed 3 mm ST segment depression. Scintigraphic images were low risk and specifically he had normal flow in the diagonal vessel territory.   In December 2014, he was concerned about the price of Brilinta.  Since his been 13 months since his ACS intervention, I switched him to Plavix and P2Y12 testing verifiend plavix sensitivity.   He has been on fenofibrate and Zetia for hyperlipidemia in light of his statin hypersensitivity.  Laboratory in March 2015 revealed a cholesterol was 177, triglycerides 68, HDL 68, LDL 95.  Hemoglobin A1c was 7.0.  He went 21, creatinine 0.88.  Normal LFTs.  He brought with him.  Laboratory which was done in [place], [place]  on 04/18/2014.  I reviewed this in detail with him.  He had normal thyroid function studies.  Hemoglobin 14.8, hematocrit 44.4.  Glucose was 142.  LDL cholesterol was 81 with total cholesterol 165, HDL 67, triglycerides 83.  Hemoglobin A1c was increased at 7.4.  PSA was 2.32.  When I saw him in December 2015 he complained of developing similar chest tightness with walking over several weeks, which was somewhat nitrate responsive.  He underwent repeat cardiac catheterization on 08/01/2014 which showedLow normal LV function with an ejection fraction of 50-55% and mild distal inferior hypocontractility. There was severe native CAD with 80% distal left main stenosis and total occlusion of the LAD in its ostium, 95% stenosis in the OM 2 vessel the circumflex with 30% narrowing in the OM1 branch proximal to the graft anastomoses, and total occlusion of the mid RCA. He had a patent LIMA graft supplying the mid LAD with 30-40% smooth mid LAD stenosis beyond the anastomosis., a widely patent stent in the mid distal aspect of the SVG supplying the diagonal vessel without evidence for restenosis., a patent sequential vein graft supplying the OM1 and OM 2 vessel with smooth 20% narrowing in the proximal portion of the sequential limb just beyond the OM1 anastomosis and a patent SVG supplying a dominant RCA with 80% mid PDA stenosis and a small PDA caliber vessel.  Increased medical therapy was recommended with titration of his nitrates and beta blocker and possible initiation of Ranexa.  Since I saw him, Daryl Joseph has felt well without recurrent anginal symptomatology  or significant shortness of breath on his increased medical regimen.  He denies recurrent anginal symptoms.  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  He has GERD and his Prilosec was changed to Protonix due to potential Plavix interaction.  He denies any presyncope or syncope, PND, orthopnea.    An echo Doppler study  in August 2017 to further evaluate his cardiac murmur demonstrated normal systolic function with grade 1 diastolic dysfunction.  There was mild aortic sclerosis.  Mean gradient 5 peak gradient 11.  There was mild Daryl.  Had normal pulmonic pressures.  There was mild TR.    He underwent laboratory by his primary physician in November 2017.  Glucose was increased at 146.  BUN was 20 and creatinine 0.96.  Liver function studies were normal.  TSH was normal at 1.45.  CBC was normal.  Lipid studies revealed a total cholesterol 159, triglycerides 65, HDL 73, and LDL 73.  Hemoglobin A1c was minimally increased at 6.6.  PSA was normal.    I saw him in February 2019.  At that time, he was having issues with bursitis and arthritis of his hip and has undergone 3 steroid injections.   Laboratory in November 2018 was done at the time he was having steroid injections which may have contributed to his glucose being elevated at 142, and hemoglobin A1c increased at 7.6.  He has continued to be on Zetia for hyperlipidemia, and remotely had LFT elevation with Mevacor.  Lipid studies were very good on Zetia with a total cholesterol 156, triglycerides 54, HDL 79, and LDL 66.  He normal LFTs and renal function.    His follow-up appointment had been rescheduled due to the [place] pandemic.  I  saw him in August 2020 after a 4-monthinterval.  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  Glucose was 134.  He had normal renal function with a creatinine of 0.98.  LFTs were normal.  Hemoglobin A1c was 7.0.  He was on amlodipine 10 mg, benazepril 40 mg, isosorbide 60 mg, and Toprol-XL 75 mg.  His blood pressure at home typically is in the 130s.  When his laboratory was checked, blood pressure was 138/63 in June at his primary care office.  He was  on Zetia and fenofibrate for hyperlipidemia, metformin 500 mg twice a day for diabetes mellitus and pantoprazole for GERD. At times he has noticed some intermittent ankle swelling. He tries to watch his sodium intake but at times he does get some excess sodium in certain foods.    When I saw him in July 2021 he continued to be stable from a cardiac standpoint.  Unfortunately he had a  right knee staph infection and [place]  At that time he had an echo Doppler done and this apparently was without significant abnormality.  He has had issues with an enlarged prostate and [place].  He denied anginal symptoms, PND, or orthopnea.  At times he notes mild ankle swelling.    I  saw him on October 17, 2020 and prior to that evaluation he had a COVID infection 2 months previously with symptoms of cough and  runny nose.  Subsequently he did develop some vague chest tightness and pressure.  He was evaluated in the emergency room on October 08, 2020.  Blood pressure was elevated.  Troponins were negative.  Subsequently, he continued to experience episodes of chest pressure and tightness which mimic his previous anginal symptomatology when he was found to have high-grade stenosis in his vein graft.  He was worked into my schedule  for further evaluation.  With his recurrent symptomatology, now 23 years status post CABG revascularization surgery I recommended definitive repeat cardiac catheterization which was done by me on October 23, 2020.  Catheterization revealed severe multivessel native CAD with 95% distal left main stenosis, total ostial occlusion of the LAD, 80% stenosis in a small ramus vessel, 40 to 50% proximal circumflex stenosis filling to the marginals and severely diseased proximal to mid RCA with subtotal/total distal occlusion.  His LIMA supplying the LAD was patent.  The sequential vein graft supplying the OM1  and distal circumflex had mild 20% focal narrowing in the proximal third of the graft but had 75% eccentric stenosis of the sequential limb of the graft immediately after the OM1 takeoff.  The saphenous vein graft supplying the diagonal vessel had a widely patent previously placed stent in the mid distal segment but there was a new 90% smooth focal stenosis in the proximal body of the graft.  He underwent successful PCI to the proximal vein graft supplying the diagonal vessel with insertion of a 3.5 x 16 mm Synergy DES stent postdilated 3.74 mm.  Subsequent to his catheterization and intervention, he has felt well and denies any recurrent symptomatology.  He was evaluated by Sande Rives, PA-C on November 12, 2020 and remained stable.  I  saw him on November 2022.  At that time he felt well.  He completed 12 weeks of cardiac rehab.  Since completion, he goes to the fitness center at least 3 days/week and exercises on the treadmill for at least 45 minutes.  He feels significantly improved since insertion of his last stent.  He continues to be on aspirin and Plavix for DAPT.  He is on amlodipine 10 mg, isosorbide 90 mg in the morning and 60 mg at bedtime, metoprolol succinate 75 mg daily, HCTZ 12.5 mg and pantoprazole.  For that evaluation he was given a flu shot in the office.  Also due to increasing lower extremity edema I suggest that he increase hydrochlorothiazide from 12.5 mg for the next 3 days to 25 mg and then take either 12.5 or 25 mg depending upon his leg swelling  I last saw him on October 23 2021.  Since his prior evaluation he has continued to feel well.   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  He denies any recurrent anginal symptoms.  He denies shortness of breath.  He had laboratory 2 months previously.  During that evaluation, his blood pressure was stable on his medical regimen of amlodipine 10 mg, benazepril 40 mg,  HCTZ which she was taking 25 mg every day, metoprolol succinate 75 mg, in addition to his isosorbide 90 mg in the morning and 60 mg in the evening.  He continues to be on DAPT with aspirin/Plavix continue to be on fenofibrate and Zetia with his history of prior statin intolerance.  As I last saw him, he has had issues with sciatica and recently underwent injection to his  left hip region by Dr. Assunta Curtis with improvement.  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  He remains asymptomatic.  At times he has noticed some trace swelling of his ankles.  He presents for evaluation.   Past Medical History:  Diagnosis Date   Anginal pain ([place])    Arthritis    "mostly in my hands" (06/25/2012)   CAD (coronary artery disease) 03/25/2007   30 d monitor - sinus rhythm w/ some PACs   DM type 2 (diabetes mellitus, type 2) ([place])    GERD (gastroesophageal reflux disease)    H/O hiatal hernia    Hyperlipidemia    Hypertension    NSTEMI (non-ST elevated myocardial infarction) ([place]) 06/18/2012   S/P angioplasty with stent, to VG -diag 1, LAD and septal perferator vessels  06/25/2012    Past Surgical History:  Procedure Laterality Date   CARDIAC CATHETERIZATION  06/25/2012   3.5 x 40m Xience Xpedition DES inserted in distal third of LAD   CARDIOVASCULAR STRESS TEST  11/05/2010   R/S MV - EF 73%; post stress LV fcn normal, no significant wall motion abnormalities; perfusion defect in inferior myocardial region consistent w/ diaphragmatic attenuation, remaining myocardium demonstrates normal perfusion; Exercise capacity 10 METS; stress EKG showed changes from baseline EKG; study results unchanged/within normal variance of last study (09/2008)   CERVICAL DISC SURGERY  1990's   CORONARY ANGIOPLASTY WITH STENT PLACEMENT  06/25/2012   "1; first one for me" (06/25/2012)   CORONARY ARTERY BYPASS GRAFT   10/1997   CABG X 4 LIMA to LAD; vein to diagonal; sequential vein to obtuse marginal and distal circumflex and vein to RCA   CORONARY STENT INTERVENTION  10/23/2020   CORONARY STENT INTERVENTION N/A 10/23/2020   Procedure: CORONARY STENT INTERVENTION;  Surgeon: KTroy Sine MD;  [facility];  Service: Cardiovascular;  Laterality: N/A;   DOPPLER ECHOCARDIOGRAPHY  09/23/2011   EF >55%; proximal septal thickening noted; mild septal hypokinesis; ; mild/mod tricuspid regurgitation; aortic root sclerosis/calcification   LEFT HEART CATH AND CORS/GRAFTS ANGIOGRAPHY N/A 10/23/2020   Procedure: LEFT HEART CATH AND CORS/GRAFTS ANGIOGRAPHY;  Surgeon: KTroy Sine MD;  [facility];  Service: Cardiovascular;  Laterality: N/A;   LEFT HEART CATHETERIZATION WITH CORONARY/GRAFT ANGIOGRAM N/A 06/21/2012   Procedure: LEFT HEART CATHETERIZATION WITH CBeatrix Fetters  Surgeon: MSanda Klein MD;  [facility];  Service: Cardiovascular;  Laterality: N/A;   LEFT HEART CATHETERIZATION WITH CORONARY/GRAFT ANGIOGRAM N/A 08/01/2014   Procedure: LEFT HEART CATHETERIZATION WITH CBeatrix Fetters  Surgeon: TTroy Sine MD;  [facility];  Service: Cardiovascular;  Laterality: N/A;   PERCUTANEOUS CORONARY STENT INTERVENTION (PCI-S) N/A 06/25/2012   Procedure: PERCUTANEOUS CORONARY STENT INTERVENTION (PCI-S);  Surgeon: TTroy Sine MD;  [facility];  Service: Cardiovascular;  Laterality: N/A;   TONSILLECTOMY AND ADENOIDECTOMY     "when I was a kid" (06/25/2012)    Allergies  Allergen Reactions   Statins Other (See Comments)    Myositis with statins "affected my liver; I almost had Hepatitis" (06/25/2012)   Welchol [Colesevelam] Other (See Comments)    unknown    Current Outpatient Medications  Medication Sig Dispense Refill   aspirin 81 MG tablet Take 81 mg by mouth daily.     ezetimibe (ZETIA) 10 MG tablet TAKE 1 TABLET BY MOUTH  AT  BEDTIME 90 tablet 3   fenofibrate 160 MG tablet TAKE 1 TABLET BY MOUTH DAILY 90 tablet 3   folic acid (FOLVITE) 1 MG tablet Take one tablet by mouth daily (Patient taking differently: Take 1 mg by mouth daily.) 90 tablet 1   Glycerin-Hypromellose-PEG 400 (DRY EYE RELIEF DROPS) 0.2-0.2-1 % SOLN Place 1 drop into both eyes daily.     isosorbide mononitrate (IMDUR) 60 MG 24 hr tablet Take 2.5 tablets (150 mg total) by mouth daily. Take 90 mg(1 1/2 tablet) in the morning and 60 mg (1 tablet) at bedtime 225 tablet 3   metFORMIN (GLUCOPHAGE) 500 MG tablet Take 500 mg by mouth 2 (two) times daily with a meal.      metoprolol succinate (TOPROL-XL) 50 MG 24 hr tablet Take 1.5 tablets (75 mg total) by mouth daily. 135 tablet 3   Multiple Vitamins-Minerals (EYE VITAMINS PO) Take 1 tablet by mouth every evening.     nitroGLYCERIN (NITROSTAT) 0.4 MG SL tablet Place 1 tablet under the tongue every 5 minutes as needed for chest pain 25 tablet 3   pantoprazole (PROTONIX) 40 MG tablet Take 40 mg by mouth daily.     amLODipine (NORVASC) 10 MG tablet TAKE 1 TABLET BY MOUTH DAILY 100 tablet 2   benazepril (LOTENSIN) 40 MG tablet TAKE 1 TABLET BY MOUTH DAILY 100 tablet 2   clopidogrel (PLAVIX) 75 MG tablet TAKE 1 TABLET BY MOUTH DAILY 100 tablet 2   hydrochlorothiazide (HYDRODIURIL) 25 MG tablet Take 1 tablet (25 mg total) by mouth daily. 90 tablet 3   No current facility-administered medications for this visit.    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 He is married with no children. He does not routinely exercise. There is no tobacco or alcohol use. 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  ROS General: Negative; No fevers, chills, or night sweats;  HEENT: Negative; No changes in vision or hearing, sinus congestion, difficulty swallowing Pulmonary: Negative; No cough, wheezing, shortness of breath, hemoptysis Cardiovascular: See history of present illness GI: Negative; No  nausea, vomiting, diarrhea, or abdominal pain GU: Positive for history of BPH; he underwent UroLift implant Musculoskeletal: Positive for bilateral hip bursitis and arthritis Hematologic/Oncology: Positive for easy bruisability secondary to antiplatelet therapy Endocrine: Positive for type 2 diabetes mellitus; no heat/cold intolerance;  Neuro: Negative; no changes in balance, headaches Skin: Negative; No rashes or skin lesions Psychiatric: Negative; No behavioral problems, depression Sleep: Negative; No snoring, daytime sleepiness, hypersomnolence, bruxism, restless legs, hypnogognic hallucinations, no cataplexy Other comprehensive 14 point system review is negative.   PE BP (!) 136/51   Pulse 71   Ht 5' 11"  (1.803 m)   Wt 156 lb 12.8 oz (71.1 kg)   SpO2 96%   BMI 21.87 kg/m    Repeat blood pressure by me was 124/60  Wt Readings from Last 3 Encounters:  03/31/22 156 lb 12.8 oz (71.1 kg)  10/23/21 157 lb 3.2 oz (71.3 kg)  06/14/21 156 lb (70.8 kg)   General: Alert, oriented, no distress.  Skin: normal turgor, no rashes, warm and dry HEENT: Normocephalic, atraumatic. Pupils equal round and reactive to light; sclera anicteric; extraocular muscles intact;  Nose without nasal septal hypertrophy Mouth/Parynx benign; Mallinpatti scale 3 Neck: No JVD, no carotid bruits; normal carotid upstroke Lungs: clear to ausculatation and percussion; no wheezing or rales Chest wall: without tenderness to palpitation Heart: PMI not displaced, RRR, s1 s2 normal, 1/6  systolic murmur, no diastolic murmur, no rubs, gallops, thrills, or heaves Abdomen: soft, nontender; no hepatosplenomehaly, BS+; abdominal aorta nontender and not dilated by palpation. Back: No change in previously documented lipoma ; no CVA tenderness Pulses 2+ Musculoskeletal: full range of motion, normal strength, no joint deformities Extremities: no clubbing cyanosis or edema, Homan's sign negative  Neurologic: grossly nonfocal;  Cranial nerves grossly wnl Psychologic: Normal mood and affect   March 31, 2022 ECG (independently read by me): NSR at 71, 1st degree AV block   October 23, 2021 ECG (independently read by me): Normal sinus rhythm at 65 bpm.  First-degree AV block with PR interval 274 ms.  June 14, 2021 ECG (independently read by me): Sinus rhythm at 68, 1st degree AV block; PR 296 msec  October 17, 2020 ECG (independently read by me): Sinus rhythm at 60; 1st degree AV block  July 2021 ECG (independently read by me): NSR at 63; Ist degree AV block; PR 244 msec; no ST changes  February 2019 ECG (independently read by me): Normal sinus rhythm at 68 bpm; first-degree AV block with a PR interval of 250 ms.  No ectopy.  Nonspecific ST changes.  January 2018 ECG (independently read by me): Normal sinus rhythm at 70 bpm with first-degree AV block with a PR interval at 270 ms.  Nonspecific T changes  October 2016 ECG (independently read by me): Normal sinus rhythm with mild sinus arrhythmia at 67 bpm.  First-degree AV block with a PR interval at 238 ms.  December 2015 ECG (independently read by me): Sinus rhythm with first-degree AV block with a PR interval at 244 ms.  No significant ST segment changes.  Prior June 2015 ECG: Normal sinus rhythm at 63 beats per minute.  First degree AV block with PR interval at 236 ms.  No significant ST changes.  LABS: Reviewed his recent emergency room evaluation and laboratory from October 08, 2020  I reviewed laboratory from 07/02/2017.  Hemoglobin 14.1, hematocrit 42.6.  Glucose 142, BUN 19, creatinine 0.96.  Normal LFTs.  Hemoglobin A1c 7.6.  PSA 2.6. Total cholesterol 156, TG 54, HDL 79, VLDL 11, LDL 66.  I reviewed blood work from 12/17/2015 done in [place], [place].   Glucose was 138.  BUN 17, creatinine 0.91.  LFTs normal. Lipid studies revealed cholesterol 150, HDL 66, triglycerides 78, LDL 68,  HbA1c 7.0  November 2017.  Glucose was increased at 146.  BUN  was 20 and creatinine 0.96.  Liver function studies were normal.  TSH was normal at 1.45.  CBC was normal.  Lipid studies revealed a total cholesterol 159, triglycerides 65, HDL 73, and LDL 73.  Hemoglobin A1c was minimally increased at 6.6.  PSA was normal.     Latest Ref Rng & Units 10/25/2020    2:06 AM 10/24/2020    2:30 AM 10/19/2020   12:40 PM  BMP  Glucose 70 - 99 mg/dL 113  161  158   BUN 8 - 23 mg/dL 15  15  15    Creatinine 0.61 - 1.24 mg/dL 1.06  0.96  1.08   BUN/Creat Ratio 10 - 24   14   Sodium 135 - 145 mmol/L 138  141  142   Potassium 3.5 - 5.1 mmol/L 3.5  3.6  4.7   Chloride 98 - 111 mmol/L 109  110  105   CO2 22 - 32 mmol/L 23  25  23    Calcium 8.9 - 10.3 mg/dL 8.5  8.9  9.8       Latest Ref Rng & Units 07/31/2014   12:24 PM 06/21/2012    9:25 AM  Hepatic Function  Total Protein 6.0 - 8.3 g/dL 7.0  6.7   Albumin 3.5 - 5.2 g/dL 4.7  3.5   AST 0 - 37 U/L 18  34   ALT 0 - 53 U/L 20  27   Alk Phosphatase 39 - 117 U/L 50  65   Total Bilirubin 0.2 - 1.2 mg/dL 0.5  0.4   Bilirubin, Direct 0.0 - 0.3 mg/dL  <0.1       Latest Ref Rng & Units 10/24/2020    2:30 AM 10/19/2020   12:40 PM 10/08/2020    4:32 PM  CBC  WBC 4.0 - 10.5 K/uL 8.9  5.5  6.2   Hemoglobin 13.0 - 17.0 g/dL 11.5  13.7  13.3   Hematocrit 39.0 - 52.0 % 33.0  41.0  40.4   Platelets 150 - 400 K/uL 181  192  208    Lab Results  Component Value Date   MCV 86.2 10/24/2020   MCV 89 10/19/2020   MCV 89.8 10/08/2020   Lab Results  Component Value Date   TSH 1.168 07/31/2014   Lab Results  Component Value Date   HGBA1C 7.5 (H) 10/23/2020   Lipid Panel     Component Value Date/Time   CHOL 133 10/25/2020 0206   TRIG 80 10/25/2020 0206   HDL 54 10/25/2020 0206   CHOLHDL 2.5 10/25/2020 0206   VLDL 16 10/25/2020 0206   LDLCALC 63 10/25/2020 0206    RADIOLOGY: No results found.  IMPRESSION:  1. CAD in native artery: s/p PCIs to SVG - Dx:  2013 and 2022   2. Hx of CABG   3. Hyperlipidemia LDL  goal <70   4. Lipoma of torso   5. Type 2 diabetes mellitus with complication, without long-term current use of insulin ([place])   6. COVID-19: January 2022   7. Statin intolerance   8. Lower extremity edema      ASSESSMENT AND PLAN: Daryl. Coates is a young appearing 88 year-old white male who is status post CABG revascularization surgery in 1999. 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  He was transitioned off Brilinta to Plavix and had verification of Plavix sensitivity by P2Y12 testing.  In December 2015 repeat catheterization showed severe native CAD with patent grafts.  He  had complete resolution of symptomatology with his increased nitrates and beta blocker therapy.  In the past, he developed marked LFT elevation with Mevacor in the 1980s and almost underwent liver biopsy.  He has not been on statin since that time.  In 2017 an echo Doppler study for evaluation of a cardiac murmur demonstrated normal LV function with grade 1 diastolic dysfunction.  There was mild Daryl and normal PA pressures.  There was no evidence for aortic stenosis.  When I saw him in March 2022 he was experiencing recurrent similar anginal symptomatology.  Repeat catheterization was performed and he was found to have a new high-grade stenosis in the proximal LAD and of the graft supplying the diagonal vessel with the previously placed stent in the distal portion of the graft remaining widely open.  He also had 75% stenosis in the sequential limb of the vein graft between the OM1 and OM 2 vessel  which was treated medically.  His LIMA to LAD was patent as was his SVG to RCA.  Since his last intervention, he has continued to do exceptionally well with complete resolution of prior symptomatology.  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  He recently developed sciatica symptomatology and underwent left hip injection by Dr. Assunta Curtis.  Blood pressure today is stable and on repeat by me was 124/60.  He continues to be on amlodipine 10 mg, benazepril 40 mg, isosorbide 90 mg in the morning and 60 mg at bedtime, metoprolol succinate 75 mg daily, and has been taking hydrochlorothiazide 25 mg 3 days/week.  With his trace ankle edema, I have suggested he try increasing this to every other day and may need to take on a daily basis depending upon symptomatology.  Prior primary provider, March Rummage, PA-C is no longer at community [facility] in [place] and he will be establishing care with [facility] [place], [place].  As long as he remains stable, I will see him in 6 months for follow-up evaluation.  Troy Sine, MD, [facility]  03/31/2022 6:42 PM

## 2022-04-30 ENCOUNTER — Other Ambulatory Visit: Payer: Self-pay | Admitting: Cardiovascular Disease

## 2022-06-15 ENCOUNTER — Other Ambulatory Visit: Payer: Self-pay | Admitting: Cardiovascular Disease

## 2022-09-06 IMAGING — CR DG CHEST 2V
2 series · 2 of 2 positions shown · non-contrast
Comparison: 06/19/2012

CLINICAL DATA: Chest pain radiating into the left shoulder

EXAM:
CHEST - 2 VIEW

[chest pa]
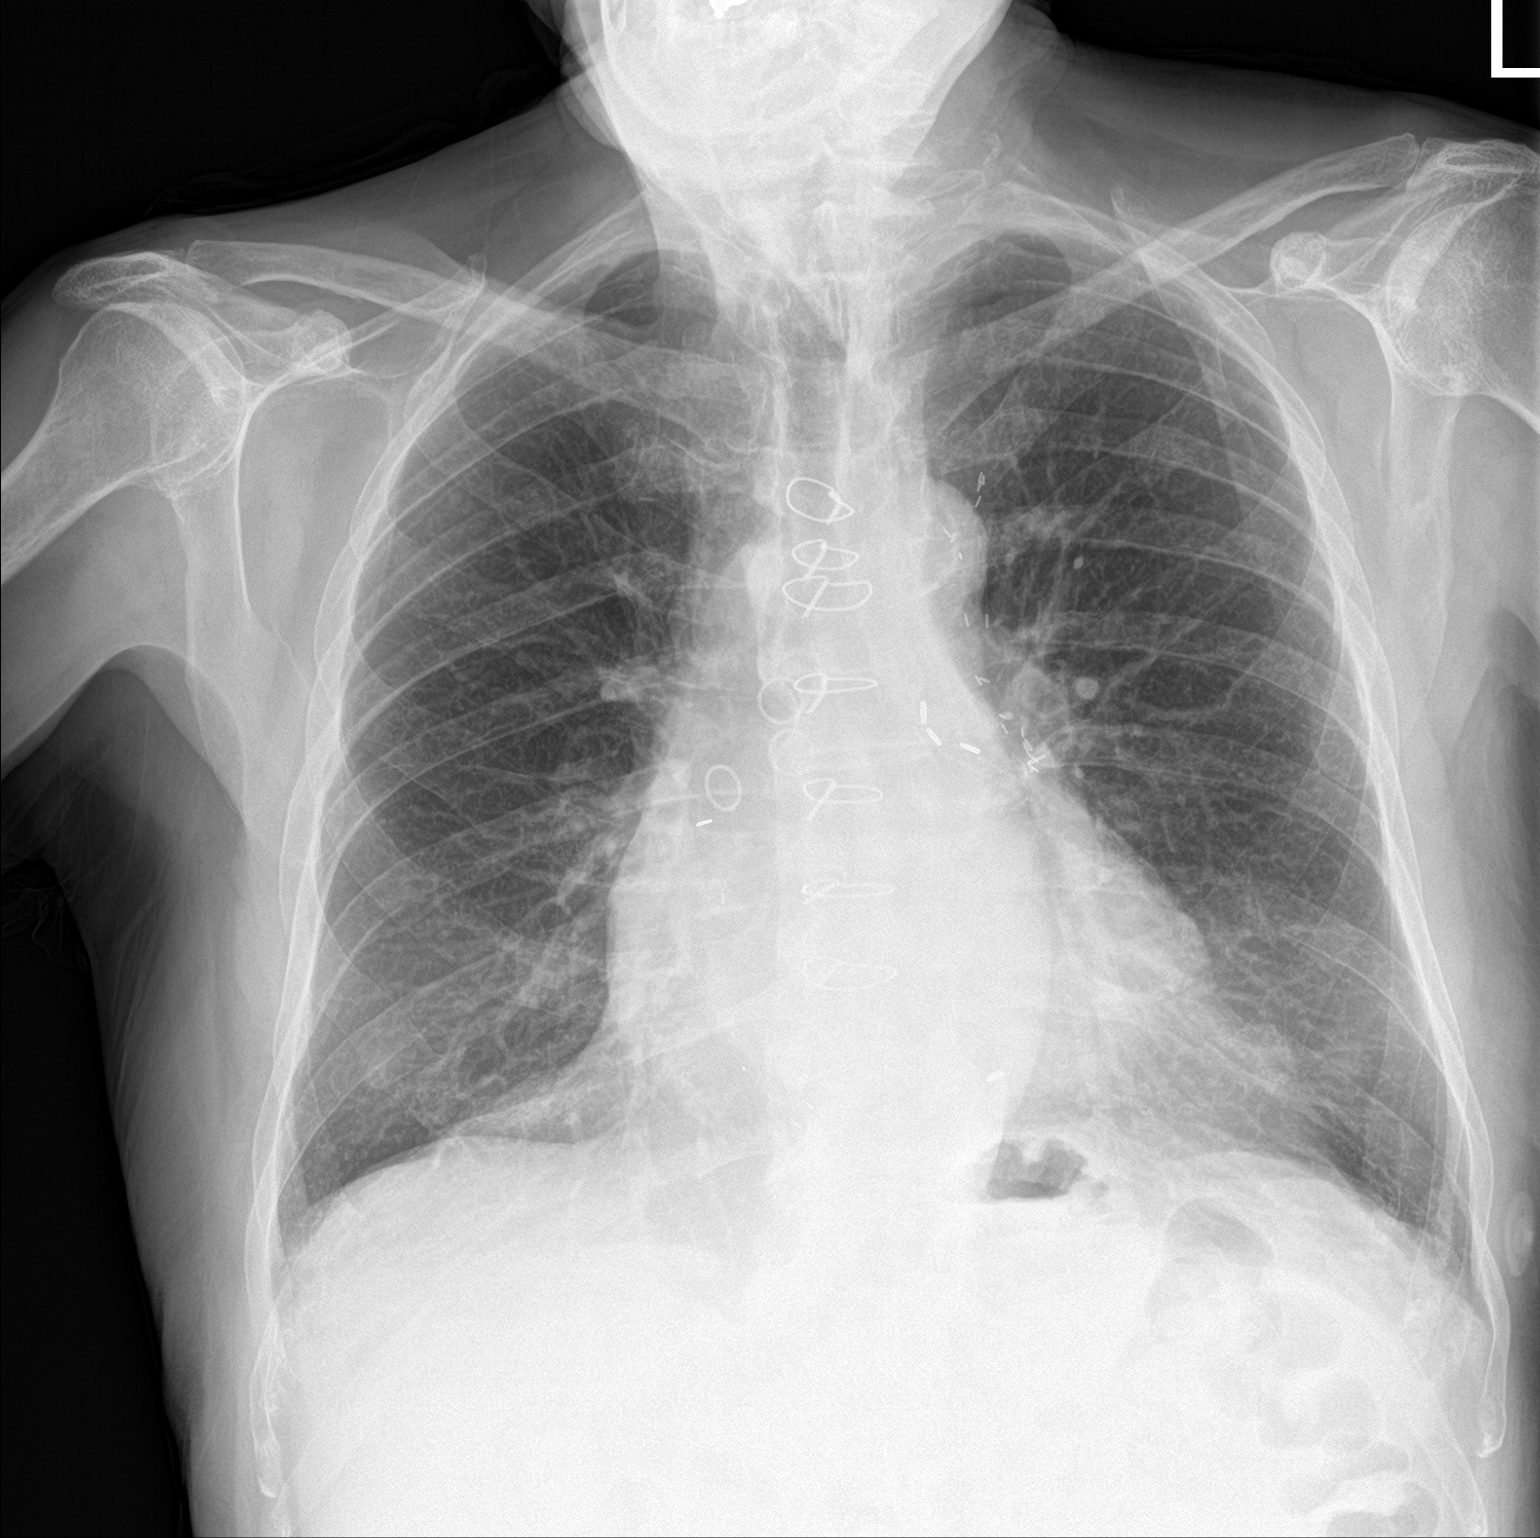

[chest lat]
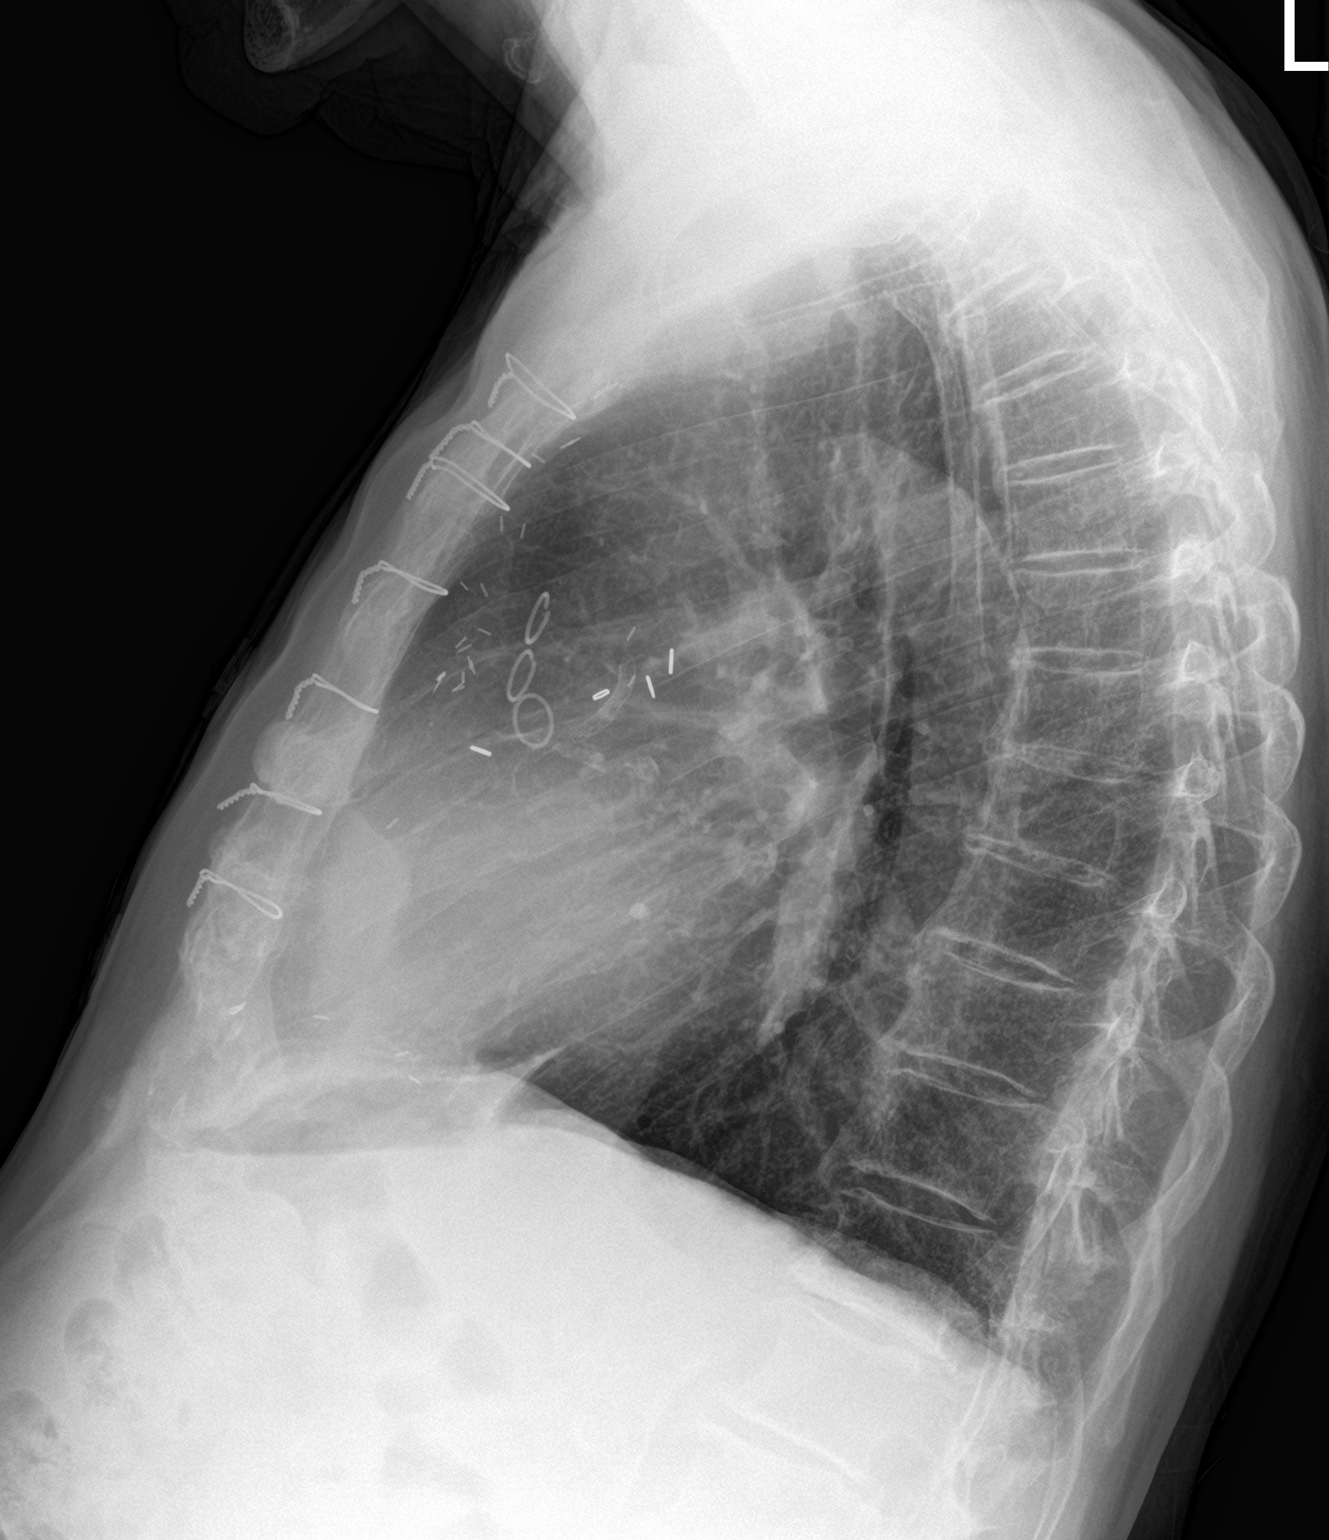

[2 of 2 positions shown; findings below may reference images not displayed]

FINDINGS: Cardiac shadow is stable. Postsurgical changes are again seen.
Aortic calcifications are noted. Lungs are well aerated bilaterally.
No focal infiltrate or effusion is seen. Degenerative changes of the
thoracic spine are noted. Extrinsic leads are noted over the chest.
Bilateral nipple shadows are again seen.
IMPRESSION: No acute abnormality noted.

## 2022-09-09 ENCOUNTER — Ambulatory Visit: Payer: Medicare Other | Attending: Cardiovascular Disease | Admitting: Cardiovascular Disease

## 2022-09-09 ENCOUNTER — Encounter: Payer: Self-pay | Admitting: Cardiovascular Disease

## 2022-09-09 VITALS — BP 130/52 | HR 72 | Ht 71.0 in | Wt 154.6 lb

## 2022-09-09 DIAGNOSIS — Z951 Presence of aortocoronary bypass graft: Secondary | ICD-10-CM

## 2022-09-09 DIAGNOSIS — Z789 Other specified health status: Secondary | ICD-10-CM | POA: Diagnosis not present

## 2022-09-09 DIAGNOSIS — D171 Benign lipomatous neoplasm of skin and subcutaneous tissue of trunk: Secondary | ICD-10-CM

## 2022-09-09 DIAGNOSIS — I2510 Atherosclerotic heart disease of native coronary artery without angina pectoris: Secondary | ICD-10-CM | POA: Diagnosis not present

## 2022-09-09 DIAGNOSIS — E785 Hyperlipidemia, unspecified: Secondary | ICD-10-CM

## 2022-09-09 DIAGNOSIS — E118 Type 2 diabetes mellitus with unspecified complications: Secondary | ICD-10-CM

## 2022-09-09 NOTE — Patient Instructions (Signed)
Medication Instructions:  The current medical regimen is effective;  continue present plan and medications.  *If you need a refill on your cardiac medications before your next appointment, please call your pharmacy*  Follow-Up: At [facility], you and your health needs are our priority.  As part of our continuing mission to provide you with exceptional heart care, we have created designated Provider Care Teams.  These Care Teams include your primary Cardiologist (physician) and Advanced Practice Providers (APPs -  Physician Assistants and Nurse Practitioners) who all work together to provide you with the care you need, when you need it.  We recommend signing up for the patient portal called "MyChart".  Sign up information is provided on this After Visit Summary.  MyChart is used to connect with patients for Virtual Visits (Telemedicine).  Patients are able to view lab/test results, encounter notes, upcoming appointments, etc.  Non-urgent messages can be sent to your provider as well.   To learn more about what you can do with MyChart, go to [email or web address].    Your next appointment:   6 month(s)  Provider:   Shelva Majestic, MD

## 2022-09-09 NOTE — Progress Notes (Signed)
Patient ID: Daryl Joseph, male   DOB: [birth year], 89 y.o.   MRN: [id number]      HPI: Daryl Joseph is a 89 y.o. male who presents to the office today for a 6 month cardiology evaluation.  Daryl. Stickels has established CAD and in 1999 underwent CABG surgery with a LIMA to the LAD, vein graft to diagonal, sequential vein graft to the obtuse marginal distal circumflex coronary artery and vein graft to the RCA. 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 In November 2013 he suffered a non-ST segment elevation myocardial infarction and underwent initial catheterization by [name]. I ultimately performed intervention a high-grade ulcerated plaque graft stenosis supplying a diagonal vessel on 06/25/2012 and a 3.5x33 mm  Xience Xpedition DES stent was inserted and post dilated 3.9 mm. Once his graft was opened, the proximal LAD was now significantly improved with reference to being filled from the diagonal vessel.    On a one year followup nuclear perfusion study he had held his metoprolol, amlodipine and isosorbide for the stress test. He did not develop any chest pain. However, he did have an exaggerated hypertensive blood pressure response to exercise and developed 3 mm ST segment depression. Scintigraphic images were low risk and specifically he had normal flow in the diagonal vessel territory.   In December 2014, he was concerned about the price of Brilinta.  Since his been 13 months since his ACS intervention, I switched him to Plavix and P2Y12 testing verifiend plavix sensitivity.   He has been on fenofibrate and Zetia for hyperlipidemia in light of his statin hypersensitivity.  Laboratory in March 2015 revealed a cholesterol was 177, triglycerides 68, HDL 68, LDL 95.  Hemoglobin A1c was 7.0.  He went 21, creatinine 0.88.  Normal LFTs.  He brought with him.  Laboratory which was done in [place], [place] on  04/18/2014.  I reviewed this in detail with him.  He had normal thyroid function studies.  Hemoglobin 14.8, hematocrit 44.4.  Glucose was 142.  LDL cholesterol was 81 with total cholesterol 165, HDL 67, triglycerides 83.  Hemoglobin A1c was increased at 7.4.  PSA was 2.32.  When I saw him in December 2015 he complained of developing similar chest tightness with walking over several weeks, which was somewhat nitrate responsive.  He underwent repeat cardiac catheterization on 08/01/2014 which showedLow normal LV function with an ejection fraction of 50-55% and mild distal inferior hypocontractility. There was severe native CAD with 80% distal left main stenosis and total occlusion of the LAD in its ostium, 95% stenosis in the OM 2 vessel the circumflex with 30% narrowing in the OM1 branch proximal to the graft anastomoses, and total occlusion of the mid RCA. He had a patent LIMA graft supplying the mid LAD with 30-40% smooth mid LAD stenosis beyond the anastomosis., a widely patent stent in the mid distal aspect of the SVG supplying the diagonal vessel without evidence for restenosis., a patent sequential vein graft supplying the OM1 and OM 2 vessel with smooth 20% narrowing in the proximal portion of the sequential limb just beyond the OM1 anastomosis and a patent SVG supplying a dominant RCA with 80% mid PDA stenosis and a small PDA caliber vessel.  Increased medical therapy was recommended with titration of his nitrates and beta blocker and possible initiation of Ranexa.  Since I saw him, Daryl Joseph has felt well without recurrent anginal symptomatology  or significant shortness of breath on his increased medical regimen.  He denies recurrent anginal symptoms.  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  He has GERD and his Prilosec was changed to Protonix due to potential Plavix interaction.  He denies any presyncope or syncope, PND, orthopnea.    An echo Doppler study in  August 2017 to further evaluate his cardiac murmur demonstrated normal systolic function with grade 1 diastolic dysfunction.  There was mild aortic sclerosis.  Mean gradient 5 peak gradient 11.  There was mild Daryl.  Had normal pulmonic pressures.  There was mild TR.    He underwent laboratory by his primary physician in November 2017.  Glucose was increased at 146.  BUN was 20 and creatinine 0.96.  Liver function studies were normal.  TSH was normal at 1.45.  CBC was normal.  Lipid studies revealed a total cholesterol 159, triglycerides 65, HDL 73, and LDL 73.  Hemoglobin A1c was minimally increased at 6.6.  PSA was normal.    I saw him in February 2019.  At that time, he was having issues with bursitis and arthritis of his hip and has undergone 3 steroid injections.   Laboratory in November 2018 was done at the time he was having steroid injections which may have contributed to his glucose being elevated at 142, and hemoglobin A1c increased at 7.6.  He has continued to be on Zetia for hyperlipidemia, and remotely had LFT elevation with Mevacor.  Lipid studies were very good on Zetia with a total cholesterol 156, triglycerides 54, HDL 79, and LDL 66.  He normal LFTs and renal function.    His follow-up appointment had been rescheduled due to the [place] pandemic.  I  saw him in August 2020 after a 65-monthinterval.  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 [place]  He had laboratory on January 21, 2019 which showed a total cholesterol 158, HDL 61, LDL 79.  Glucose was 134.  He had normal renal function with a creatinine of 0.98.  LFTs were normal.  Hemoglobin A1c was 7.0.  He was on amlodipine 10 mg, benazepril 40 mg, isosorbide 60 mg, and Toprol-XL 75 mg.  His blood pressure at home typically is in the 130s.  When his laboratory was checked, blood pressure was 138/63 in June at his primary care office.  He was on  Zetia and fenofibrate for hyperlipidemia, metformin 500 mg twice a day for diabetes mellitus and pantoprazole for GERD. At times he has noticed some intermittent ankle swelling. He tries to watch his sodium intake but at times he does get some excess sodium in certain foods.    When I saw him in July 2021 he continued to be stable from a cardiac standpoint.  Unfortunately he had a  right [place] staph [place]  At that time he had an echo Doppler done and this apparently was without significant abnormality.  He has had issues with an enlarged prostate and [place].  He denied anginal symptoms, PND, or orthopnea.  At times he notes mild ankle swelling.    I saw him on October 17, 2020 and prior to that evaluation he had a COVID infection 2 months previously with symptoms of cough and runny  nose.  Subsequently he did develop some vague chest tightness and pressure.  He was evaluated in the emergency room on October 08, 2020.  Blood pressure was elevated.  Troponins were negative.  Subsequently, he continued to experience episodes of chest pressure and tightness which mimic his previous anginal symptomatology when he was found to have high-grade stenosis in his vein graft.  He was worked into my schedule  for further evaluation.  With his recurrent symptomatology, now 23 years status post CABG revascularization surgery I recommended definitive repeat cardiac catheterization which was done by me on October 23, 2020.  Catheterization revealed severe multivessel native CAD with 95% distal left main stenosis, total ostial occlusion of the LAD, 80% stenosis in a small ramus vessel, 40 to 50% proximal circumflex stenosis filling to the marginals and severely diseased proximal to mid RCA with subtotal/total distal occlusion.  His LIMA supplying the LAD was patent.  The sequential vein graft supplying the OM1 and  distal circumflex had mild 20% focal narrowing in the proximal third of the graft but had 75% eccentric stenosis of the sequential limb of the graft immediately after the OM1 takeoff.  The saphenous vein graft supplying the diagonal vessel had a widely patent previously placed stent in the mid distal segment but there was a new 90% smooth focal stenosis in the proximal body of the graft.  He underwent successful PCI to the proximal vein graft supplying the diagonal vessel with insertion of a 3.5 x 16 mm Synergy DES stent postdilated 3.74 mm.  Subsequent to his catheterization and intervention, he has felt well and denies any recurrent symptomatology.  He was evaluated by Sande Rives, PA-C on November 12, 2020 and remained stable.  I  saw him on November 2022.  At that time he felt well.  He completed 12 weeks of cardiac rehab.  Since completion, he goes to the fitness center at least 3 days/week and exercises on the treadmill for at least 45 minutes.  He feels significantly improved since insertion of his last stent.  He continues to be on aspirin and Plavix for DAPT.  He is on amlodipine 10 mg, isosorbide 90 mg in the morning and 60 mg at bedtime, metoprolol succinate 75 mg daily, HCTZ 12.5 mg and pantoprazole.  For that evaluation he was given a flu shot in the office.  Also due to increasing lower extremity edema I suggest that he increase hydrochlorothiazide from 12.5 mg for the next 3 days to 25 mg and then take either 12.5 or 25 mg depending upon his leg swelling  I saw him on October 23 2021.  Since his prior evaluation he has continued to feel well.   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  He denies any recurrent anginal symptoms.  He denies shortness of breath.  He had laboratory 2 months previously.  During that evaluation, his blood pressure was stable on his medical regimen of amlodipine 10 mg, benazepril 40 mg, HCTZ  which she was taking 25 mg every day, metoprolol succinate 75 mg, in addition to his isosorbide 90 mg in the morning and 60 mg in the evening.  He continues to be on DAPT with aspirin/Plavix continue to be on fenofibrate and Zetia with his history of prior statin intolerance.  I last saw him on March 31, 2022.  Since his prior evaluation he had issues with sciatica and  recently underwent injection to his left hip region by Dr. Assunta Curtis with improvement.  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  He remains asymptomatic.  At times he has noticed some trace swelling of his ankles.  Laboratory in July 2023 was stable with normal LFTs.  LDL was 62.  Since I last saw him, Daryl. 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  He walks regularly.  He denies chest pain or shortness of breath he continues to be on long-term DAPT with aspirin/Plavix.  He is on Zetia and fenofibrate for his hyperlipidemia and remotely had developed statin induced liver toxicity secondary to Mevacor in 1987 when I had initially seen him.  He remains asymptomatic and is without chest pain, shortness of breath, presyncope, palpitations or syncope.  He presents for follow-up evaluation.   Past Medical History:  Diagnosis Date   Anginal pain ([place])    Arthritis    "mostly in my hands" (06/25/2012)   CAD (coronary artery disease) 03/25/2007   30 d monitor - sinus rhythm w/ some PACs   DM type 2 (diabetes mellitus, type 2) ([place])    GERD (gastroesophageal reflux disease)    H/O hiatal hernia    Hyperlipidemia    Hypertension    NSTEMI (non-ST elevated myocardial infarction) ([place]) 06/18/2012   S/P angioplasty with stent, to VG -diag 1, LAD and septal perferator vessels  06/25/2012    Past Surgical History:  Procedure Laterality Date   CARDIAC CATHETERIZATION  06/25/2012   3.5 x 47m Xience  Xpedition DES inserted in distal third of LAD   CARDIOVASCULAR STRESS TEST  11/05/2010   R/S MV - EF 73%; post stress LV fcn normal, no significant wall motion abnormalities; perfusion defect in inferior myocardial region consistent w/ diaphragmatic attenuation, remaining myocardium demonstrates normal perfusion; Exercise capacity 10 METS; stress EKG showed changes from baseline EKG; study results unchanged/within normal variance of last study (09/2008)   CERVICAL DISC SURGERY  1990's   CORONARY ANGIOPLASTY WITH STENT PLACEMENT  06/25/2012   "1; first one for me" (06/25/2012)   CORONARY ARTERY BYPASS GRAFT  10/1997   CABG X 4 LIMA to LAD; vein to diagonal; sequential vein to obtuse marginal and distal circumflex and vein to RCA   CORONARY STENT INTERVENTION  10/23/2020   CORONARY STENT INTERVENTION N/A 10/23/2020   Procedure: CORONARY STENT INTERVENTION;  Surgeon: KTroy Sine MD;  [facility];  Service: Cardiovascular;  Laterality: N/A;   DOPPLER ECHOCARDIOGRAPHY  09/23/2011   EF >55%; proximal septal thickening noted; mild septal hypokinesis; ; mild/mod tricuspid regurgitation; aortic root sclerosis/calcification   LEFT HEART CATH AND CORS/GRAFTS ANGIOGRAPHY N/A 10/23/2020   Procedure: LEFT HEART CATH AND CORS/GRAFTS ANGIOGRAPHY;  Surgeon: KTroy Sine MD;  [facility];  Service: Cardiovascular;  Laterality: N/A;   LEFT HEART CATHETERIZATION WITH CORONARY/GRAFT ANGIOGRAM N/A 06/21/2012   Procedure: LEFT HEART CATHETERIZATION WITH CBeatrix Fetters  Surgeon: MSanda Klein MD;  [facility];  Service: Cardiovascular;  Laterality: N/A;   LEFT HEART CATHETERIZATION WITH CORONARY/GRAFT ANGIOGRAM N/A 08/01/2014   Procedure: LEFT HEART CATHETERIZATION WITH CBeatrix Fetters  Surgeon: TTroy Sine MD;  [facility];  Service: Cardiovascular;  Laterality: N/A;   PERCUTANEOUS CORONARY STENT INTERVENTION (PCI-S) N/A 06/25/2012    Procedure: PERCUTANEOUS CORONARY STENT  INTERVENTION (PCI-S);  Surgeon: Troy Sine, MD;  Location: [facility] C[facility] LAB;  Service: Cardiovascular;  Laterality: N/A;   TONSILLECTOMY AND ADENOIDECTOMY     "when I was a kid" (06/25/2012)    Allergies  Allergen Reactions   Statins Other (See Comments)    Myositis with statins "affected my liver; I almost had Hepatitis" (06/25/2012)   Welchol [Colesevelam] Other (See Comments)    unknown    Current Outpatient Medications  Medication Sig Dispense Refill   amLODipine (NORVASC) 10 MG tablet TAKE 1 TABLET BY MOUTH DAILY 100 tablet 2   aspirin 81 MG tablet Take 81 mg by mouth daily.     benazepril (LOTENSIN) 40 MG tablet TAKE 1 TABLET BY MOUTH DAILY 100 tablet 2   clopidogrel (PLAVIX) 75 MG tablet TAKE 1 TABLET BY MOUTH DAILY 100 tablet 2   ezetimibe (ZETIA) 10 MG tablet TAKE 1 TABLET BY MOUTH AT  BEDTIME 90 tablet 3   fenofibrate 160 MG tablet TAKE 1 TABLET BY MOUTH DAILY 90 tablet 3   folic acid (FOLVITE) 1 MG tablet Take one tablet by mouth daily (Patient taking differently: Take 1 mg by mouth daily.) 90 tablet 1   Glycerin-Hypromellose-PEG 400 (DRY EYE RELIEF DROPS) 0.2-0.2-1 % SOLN Place 1 drop into both eyes daily.     hydrochlorothiazide (HYDRODIURIL) 25 MG tablet TAKE 1 TABLET BY MOUTH DAILY 80 tablet 3   isosorbide mononitrate (IMDUR) 60 MG 24 hr tablet TAKE 1 AND 1/2 TABLETS (90 MG)  BY MOUTH EVERY MORNING AND 1  TABLET (60 MG) AT BEDTIME (TOTAL OF 2 AND 1/2 TABLETS DAILY) 200 tablet 3   metFORMIN (GLUCOPHAGE) 500 MG tablet Take 500 mg by mouth 2 (two) times daily with a meal.      metoprolol succinate (TOPROL-XL) 50 MG 24 hr tablet TAKE 1 AND 1/2 TABLETS BY MOUTH  DAILY 150 tablet 2   Multiple Vitamins-Minerals (EYE VITAMINS PO) Take 1 tablet by mouth every evening.     nitroGLYCERIN (NITROSTAT) 0.4 MG SL tablet Place 1 tablet under the tongue every 5 minutes as needed for chest pain 25 tablet 3   pantoprazole (PROTONIX) 40 MG tablet Take 40  mg by mouth daily.     No current facility-administered medications for this visit.    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 He is married with no children. He does not routinely exercise. There is no tobacco or alcohol use. 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  ROS General: Negative; No fevers, chills, or night sweats;  HEENT: Negative; No changes in vision or hearing, sinus congestion, difficulty swallowing Pulmonary: Negative; No cough, wheezing, shortness of breath, hemoptysis Cardiovascular: See history of present illness GI: Negative; No nausea, vomiting, diarrhea, or abdominal pain GU: Positive for history of BPH; he underwent UroLift implant Musculoskeletal: Positive for bilateral hip bursitis and arthritis Hematologic/Oncology: Positive for easy bruisability secondary to antiplatelet therapy Endocrine: Positive for type 2 diabetes mellitus; no heat/cold intolerance;  Neuro: Negative; no changes in balance, headaches Skin: Negative; No rashes or skin lesions Psychiatric: Negative; No behavioral problems, depression Sleep: Negative; No snoring, daytime sleepiness, hypersomnolence, bruxism, restless legs, hypnogognic hallucinations, no cataplexy Other comprehensive 14 point system review is negative.   PE BP (!) 130/52   Pulse 72   Ht '5\' 11"'$  (1.803 m)   Wt 154 lb 9.6 oz (70.1 kg)   SpO2 97%   BMI 21.56 kg/m    Repeat blood pressure  by me was 130/66  Wt Readings from Last 3 Encounters:  09/09/22 154 lb 9.6 oz (70.1 kg)  03/31/22 156 lb 12.8 oz (71.1 kg)  10/23/21 157 lb 3.2 oz (71.3 kg)   General: Alert, oriented, no distress.  Skin: normal turgor, no rashes, warm and dry HEENT: Normocephalic, atraumatic. Pupils equal round and reactive to light; sclera anicteric; extraocular muscles intact;  Nose without nasal septal hypertrophy Mouth/Parynx benign; Mallinpatti scale 3 Neck: No JVD, no carotid bruits; normal carotid  upstroke Lungs: clear to ausculatation and percussion; no wheezing or rales Chest wall: without tenderness to palpitation Heart: PMI not displaced, RRR, s1 s2 normal, 1/6 systolic murmu along the left sternal border, no diastolic murmur, no rubs, gallops, thrills, or heaves Abdomen: Diastases recti; soft, nontender; no hepatosplenomehaly, BS+; abdominal aorta nontender and not dilated by palpation. Back: Stable lipoma on back; no CVA tenderness Pulses 2+ Musculoskeletal: full range of motion, normal strength, no joint deformities Extremities: no clubbing cyanosis or edema, Homan's sign negative  Neurologic: grossly nonfocal; Cranial nerves grossly wnl Psychologic: Normal mood and affect  March 31, 2022 ECG (independently read by me): NSR at 71, 1st degree AV block   October 23, 2021 ECG (independently read by me): Normal sinus rhythm at 65 bpm.  First-degree AV block with PR interval 274 ms.  June 14, 2021 ECG (independently read by me): Sinus rhythm at 68, 1st degree AV block; PR 296 msec  October 17, 2020 ECG (independently read by me): Sinus rhythm at 60; 1st degree AV block  July 2021 ECG (independently read by me): NSR at 63; Ist degree AV block; PR 244 msec; no ST changes  February 2019 ECG (independently read by me): Normal sinus rhythm at 68 bpm; first-degree AV block with a PR interval of 250 ms.  No ectopy.  Nonspecific ST changes.  January 2018 ECG (independently read by me): Normal sinus rhythm at 70 bpm with first-degree AV block with a PR interval at 270 ms.  Nonspecific T changes  October 2016 ECG (independently read by me): Normal sinus rhythm with mild sinus arrhythmia at 67 bpm.  First-degree AV block with a PR interval at 238 ms.  December 2015 ECG (independently read by me): Sinus rhythm with first-degree AV block with a PR interval at 244 ms.  No significant ST segment changes.  Prior June 2015 ECG: Normal sinus rhythm at 63 beats per minute.  First degree AV block  with PR interval at 236 ms.  No significant ST changes.  LABS: Reviewed his recent emergency room evaluation and laboratory from October 08, 2020  I reviewed laboratory from 07/02/2017.  Hemoglobin 14.1, hematocrit 42.6.  Glucose 142, BUN 19, creatinine 0.96.  Normal LFTs.  Hemoglobin A1c 7.6.  PSA 2.6. Total cholesterol 156, TG 54, HDL 79, VLDL 11, LDL 66.  I reviewed blood work from 12/17/2015 done in [place], [place].   Glucose was 138.  BUN 17, creatinine 0.91.  LFTs normal. Lipid studies revealed cholesterol 150, HDL 66, triglycerides 78, LDL 68,  HbA1c 7.0  November 2017.  Glucose was increased at 146.  BUN was 20 and creatinine 0.96.  Liver function studies were normal.  TSH was normal at 1.45.  CBC was normal.  Lipid studies revealed a total cholesterol 159, triglycerides 65, HDL 73, and LDL 73.  Hemoglobin A1c was minimally increased at 6.6.  PSA was normal.     Latest Ref Rng & Units 10/25/2020    2:06 AM 10/24/2020  2:30 AM 10/19/2020   12:40 PM  BMP  Glucose 70 - 99 mg/dL 113  161  158   BUN 8 - 23 mg/dL '15  15  15   '$ Creatinine 0.61 - 1.24 mg/dL 1.06  0.96  1.08   BUN/Creat Ratio 10 - 24   14   Sodium 135 - 145 mmol/L 138  141  142   Potassium 3.5 - 5.1 mmol/L 3.5  3.6  4.7   Chloride 98 - 111 mmol/L 109  110  105   CO2 22 - 32 mmol/L '23  25  23   '$ Calcium 8.9 - 10.3 mg/dL 8.5  8.9  9.8       Latest Ref Rng & Units 07/31/2014   12:24 PM 06/21/2012    9:25 AM  Hepatic Function  Total Protein 6.0 - 8.3 g/dL 7.0  6.7   Albumin 3.5 - 5.2 g/dL 4.7  3.5   AST 0 - 37 U/L 18  34   ALT 0 - 53 U/L 20  27   Alk Phosphatase 39 - 117 U/L 50  65   Total Bilirubin 0.2 - 1.2 mg/dL 0.5  0.4   Bilirubin, Direct 0.0 - 0.3 mg/dL  <0.1       Latest Ref Rng & Units 10/24/2020    2:30 AM 10/19/2020   12:40 PM 10/08/2020    4:32 PM  CBC  WBC 4.0 - 10.5 K/uL 8.9  5.5  6.2   Hemoglobin 13.0 - 17.0 g/dL 11.5  13.7  13.3   Hematocrit 39.0 - 52.0 % 33.0  41.0  40.4   Platelets 150 -  400 K/uL 181  192  208    Lab Results  Component Value Date   MCV 86.2 10/24/2020   MCV 89 10/19/2020   MCV 89.8 10/08/2020   Lab Results  Component Value Date   TSH 1.168 07/31/2014   Lab Results  Component Value Date   HGBA1C 7.5 (H) 10/23/2020   Lipid Panel     Component Value Date/Time   CHOL 133 10/25/2020 0206   TRIG 80 10/25/2020 0206   HDL 54 10/25/2020 0206   CHOLHDL 2.5 10/25/2020 0206   VLDL 16 10/25/2020 0206   LDLCALC 63 10/25/2020 0206    RADIOLOGY: No results found.  IMPRESSION:  1. CAD in native artery: s/p PCIs to SVG - Dx:  2013 and 2022   2. Hx of CABG   3. Hyperlipidemia LDL goal <70   4. Statin intolerance   5. Lipoma of torso   6. Type 2 diabetes mellitus with complication, without long-term current use of insulin (HCC)     ASSESSMENT AND PLAN: Daryl Joseph is a young appearing 89 year-old white male who is status post CABG revascularization surgery in 1999. 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  He was transitioned off Brilinta to Plavix and had verification of Plavix sensitivity by P2Y12 testing.  In December 2015 repeat catheterization showed severe native CAD with patent grafts.  He  had complete resolution of symptomatology with his increased nitrates and beta blocker therapy.  In the past, he developed marked LFT elevation with Mevacor in 1987  and almost underwent liver biopsy.  He has not been on statin since that time.  In 2017 an echo Doppler study for evaluation of a cardiac murmur demonstrated normal LV function  with grade 1 diastolic dysfunction.  There was mild Daryl and normal PA pressures.  There was no evidence for aortic stenosis.  When I saw him in March 2022 he was experiencing recurrent similar anginal symptomatology.  Repeat catheterization was performed and he was found to have a  new high-grade stenosis in the proximal LAD and of the graft supplying the diagonal vessel with the previously placed stent in the distal portion of the graft remaining widely open.  He also had 75% stenosis in the sequential limb of the vein graft between the OM1 and OM 2 vessel which was treated medically.  His LIMA to LAD was patent as was his SVG to RCA.  Since his last intervention, he has continued to do exceptionally well with complete resolution of prior symptomatology.  Presently, Daryl Joseph remains asymptomatic and has been without chest pain or shortness of breath.  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  He required left hip injection for development of sciatica last year with improvement.  His blood pressure is stable today on his regimen of amlodipine 10 mg, HCTZ which he now takes 1 pill every other day or 3 days/week, isosorbide 90 mg in the morning and 60 mg at night, in addition to metoprolol succinate 75 mg daily.  He is diabetic on metformin 500 mg twice a day.  He continues to be on Zetia and fenofibrate for his next hyperlipidemia.  He underwent repeat laboratory on August 26, 2022.  Lipid studies had increased slightly since his prior evaluation total cholesterol now 175, triglycerides 84, HDL 80 and LDL was now 80.  He admits that his diet has not been as stringent and he will improve this.  He now sees Amy Willits, [place] in [place] for primary care.  I will see him in 6 months for reevaluation or sooner as needed.  Troy Sine, MD, [facility]  09/09/2022 1:10 PM

## 2022-09-18 ENCOUNTER — Other Ambulatory Visit: Payer: Self-pay | Admitting: Cardiovascular Disease

## 2022-10-01 ENCOUNTER — Other Ambulatory Visit: Payer: Self-pay | Admitting: Cardiovascular Disease

## 2023-01-04 ENCOUNTER — Other Ambulatory Visit: Payer: Self-pay | Admitting: Cardiovascular Disease

## 2023-02-26 ENCOUNTER — Other Ambulatory Visit: Payer: Self-pay | Admitting: Cardiovascular Disease

## 2023-03-04 ENCOUNTER — Other Ambulatory Visit: Payer: Self-pay | Admitting: Cardiovascular Disease

## 2023-03-16 ENCOUNTER — Other Ambulatory Visit: Payer: Self-pay | Admitting: Cardiovascular Disease

## 2023-03-17 ENCOUNTER — Other Ambulatory Visit: Payer: Self-pay | Admitting: Cardiovascular Disease

## 2023-03-26 NOTE — Progress Notes (Signed)
Patient ID: Daryl Joseph, male   DOB: March 17, 1933, 89 y.o.   MRN: [id number]      HPI: Daryl Joseph is a 89 y.o. male who presents to the office today for a 6 month cardiology evaluation.  Daryl Joseph has established CAD and in 1999 underwent CABG surgery with a LIMA to the LAD, vein graft to diagonal, sequential vein graft to the obtuse marginal distal circumflex coronary artery and vein graft to the RCA. 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 In November 2013 he suffered a non-ST segment elevation myocardial infarction and underwent initial catheterization by [name]. I ultimately performed intervention a high-grade ulcerated plaque graft stenosis supplying a diagonal vessel on 06/25/2012 and a 3.5x33 mm  Xience Xpedition DES stent was inserted and post dilated 3.9 mm. Once his graft was opened, the proximal LAD was now significantly improved with reference to being filled from the diagonal vessel.    On a one year followup nuclear perfusion study he had held his metoprolol, amlodipine and isosorbide for the stress test. He did not develop any chest pain. However, he did have an exaggerated hypertensive blood pressure response to exercise and developed 3 mm ST segment depression. Scintigraphic images were low risk and specifically he had normal flow in the diagonal vessel territory.   In December 2014, he was concerned about the price of Brilinta.  Since his been 13 months since his ACS intervention, I switched him to Plavix and P2Y12 testing verifiend plavix sensitivity.   He has been on fenofibrate and Zetia for hyperlipidemia in light of his statin hypersensitivity.  Laboratory in March 2015 revealed a cholesterol was 177, triglycerides 68, HDL 68, LDL 95.  Hemoglobin A1c was 7.0.  He went 21, creatinine 0.88.  Normal LFTs.  He brought with him.  Laboratory which was done in [place], [place]  on 04/18/2014.  I reviewed this in detail with him.  He had normal thyroid function studies.  Hemoglobin 14.8, hematocrit 44.4.  Glucose was 142.  LDL cholesterol was 81 with total cholesterol 165, HDL 67, triglycerides 83.  Hemoglobin A1c was increased at 7.4.  PSA was 2.32.  When I saw him in December 2015 he complained of developing similar chest tightness with walking over several weeks, which was somewhat nitrate responsive.  He underwent repeat cardiac catheterization on 08/01/2014 which showedLow normal LV function with an ejection fraction of 50-55% and mild distal inferior hypocontractility. There was severe native CAD with 80% distal left main stenosis and total occlusion of the LAD in its ostium, 95% stenosis in the OM 2 vessel the circumflex with 30% narrowing in the OM1 branch proximal to the graft anastomoses, and total occlusion of the mid RCA. He had a patent LIMA graft supplying the mid LAD with 30-40% smooth mid LAD stenosis beyond the anastomosis., a widely patent stent in the mid distal aspect of the SVG supplying the diagonal vessel without evidence for restenosis., a patent sequential vein graft supplying the OM1 and OM 2 vessel with smooth 20% narrowing in the proximal portion of the sequential limb just beyond the OM1 anastomosis and a patent SVG supplying a dominant RCA with 80% mid PDA stenosis and a small PDA caliber vessel.  Increased medical therapy was recommended with titration of his nitrates and beta blocker and possible initiation of Ranexa.  Since I saw him, Daryl Joseph has felt well without recurrent anginal symptomatology  or significant shortness of breath on his increased medical regimen.  He denies recurrent anginal symptoms.  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  He has GERD and his Prilosec was changed to Protonix due to potential Plavix interaction.  He denies any presyncope or syncope, PND, orthopnea.    An echo Doppler study  in August 2017 to further evaluate his cardiac murmur demonstrated normal systolic function with grade 1 diastolic dysfunction.  There was mild aortic sclerosis.  Mean gradient 5 peak gradient 11.  There was mild Daryl.  Had normal pulmonic pressures.  There was mild TR.    He underwent laboratory by his primary physician in November 2017.  Glucose was increased at 146.  BUN was 20 and creatinine 0.96.  Liver function studies were normal.  TSH was normal at 1.45.  CBC was normal.  Lipid studies revealed a total cholesterol 159, triglycerides 65, HDL 73, and LDL 73.  Hemoglobin A1c was minimally increased at 6.6.  PSA was normal.    I saw him in February 2019.  At that time, he was having issues with bursitis and arthritis of his hip and has undergone 3 steroid injections.   Laboratory in November 2018 was done at the time he was having steroid injections which may have contributed to his glucose being elevated at 142, and hemoglobin A1c increased at 7.6.  He has continued to be on Zetia for hyperlipidemia, and remotely had LFT elevation with Mevacor.  Lipid studies were very good on Zetia with a total cholesterol 156, triglycerides 54, HDL 79, and LDL 66.  He normal LFTs and renal function.    His follow-up appointment had been rescheduled due to the COVID pandemic.  I  saw him in August 2020 after a 66-month interval.  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  He had laboratory on January 21, 2019 which showed a total cholesterol 158, HDL 61, LDL 79.  Glucose was 134.  He had normal renal function with a creatinine of 0.98.  LFTs were normal.  Hemoglobin A1c was 7.0.  He was on amlodipine 10 mg, benazepril 40 mg, isosorbide 60 mg, and Toprol-XL 75 mg.  His blood pressure at home typically is in the 130s.  When his laboratory was checked, blood pressure was 138/63 in June at his primary care office.  He was  on Zetia and fenofibrate for hyperlipidemia, metformin 500 mg twice a day for diabetes mellitus and pantoprazole for GERD. At times he has noticed some intermittent ankle swelling. He tries to watch his sodium intake but at times he does get some excess sodium in certain foods.    When I saw him in July 2021 he continued to be stable from a cardiac standpoint.  Unfortunately he had a  right knee staph infection and was operated on in [place] in April 2021.  At that time he had an echo Doppler done and this apparently was without significant abnormality.  He has had issues with an enlarged prostate and is seeing urologist in [place] who was  considering possible UroLift surgery.  He denied anginal symptoms, PND, or orthopnea.  At times he notes mild ankle swelling.    I saw him on October 17, 2020 and prior to that evaluation he had a COVID infection 2 months previously with symptoms of cough and runny  nose.  Subsequently he did develop some vague chest tightness and pressure.  He was evaluated in the emergency room on October 08, 2020.  Blood pressure was elevated.  Troponins were negative.  Subsequently, he continued to experience episodes of chest pressure and tightness which mimic his previous anginal symptomatology when he was found to have high-grade stenosis in his vein graft.  He was worked into my schedule  for further evaluation.  With his recurrent symptomatology, now 23 years status post CABG revascularization surgery I recommended definitive repeat cardiac catheterization which was done by me on October 23, 2020.  Catheterization revealed severe multivessel native CAD with 95% distal left main stenosis, total ostial occlusion of the LAD, 80% stenosis in a small ramus vessel, 40 to 50% proximal circumflex stenosis filling to the marginals and severely diseased proximal to mid RCA with subtotal/total distal occlusion.  His LIMA supplying the LAD was patent.  The sequential vein graft supplying the OM1 and  distal circumflex had mild 20% focal narrowing in the proximal third of the graft but had 75% eccentric stenosis of the sequential limb of the graft immediately after the OM1 takeoff.  The saphenous vein graft supplying the diagonal vessel had a widely patent previously placed stent in the mid distal segment but there was a new 90% smooth focal stenosis in the proximal body of the graft.  He underwent successful PCI to the proximal vein graft supplying the diagonal vessel with insertion of a 3.5 x 16 mm Synergy DES stent postdilated 3.74 mm.  Subsequent to his catheterization and intervention, he has felt well and denies any recurrent symptomatology.  He was evaluated by Daryl Skiff, PA-C on November 12, 2020 and remained stable.  I  saw him on November 2022.  At that time he felt well.  He completed 12 weeks of cardiac rehab.  Since completion, he goes to the fitness center at least 3 days/week and exercises on the treadmill for at least 45 minutes.  He feels significantly improved since insertion of his last stent.  He continues to be on aspirin and Plavix for DAPT.  He is on amlodipine 10 mg, isosorbide 90 mg in the morning and 60 mg at bedtime, metoprolol succinate 75 mg daily, HCTZ 12.5 mg and pantoprazole.  For that evaluation he was given a flu shot in the office.  Also due to increasing lower extremity edema I suggest that he increase hydrochlorothiazide from 12.5 mg for the next 3 days to 25 mg and then take either 12.5 or 25 mg depending upon his leg swelling  I saw him on October 23 2021.  Since his prior evaluation he has continued to feel well.   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  He denies any recurrent anginal symptoms.  He denies shortness of breath.  He had laboratory 2 months previously.  During that evaluation, his blood pressure was stable on his medical regimen of amlodipine 10 mg, benazepril 40 mg, HCTZ  which she was taking 25 mg every day, metoprolol succinate 75 mg, in addition to his isosorbide 90 mg in the morning and 60 mg in the evening.  He continues to be on DAPT with aspirin/Plavix continue to be on fenofibrate and Zetia with his history of prior statin intolerance.  I saw him on March 31, 2022.  Since his prior evaluation he had issues with sciatica and recently  underwent injection to his left hip region by Dr. Abigail Miyamoto with improvement.  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  He remains asymptomatic.  At times he has noticed some trace swelling of his ankles.  Laboratory in July 2023 was stable with normal LFTs.  LDL was 62.  I last saw him on September 09, 2022.  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  He walks regularly.  He denies chest pain or shortness of breath he continues to be on long-term DAPT with aspirin/Plavix.  He is on Zetia and fenofibrate for his hyperlipidemia and remotely had developed statin induced liver toxicity secondary to Mevacor in 1987 when I had initially seen him.  He remains asymptomatic and is without chest pain, shortness of breath, presyncope, palpitations or syncope.  Lipid studies had increased slightly since his prior evaluation with total cholesterol 175, triglycerides 84 HDL 80 and LDL 80.  He admitted that his diet had not been extrinsic as it was in the past and he would improve this.  He also has established primary care with [facility] in [place].  Presently, Daryl Joseph remains asymptomatic.  He has developed macular degeneration in his left eye and may need to undergo surgery.  If so he he may need to hold Plavix and aspirin and will check with his ophthalmologist.  He is unaware of any palpitations.  He remains active.  He had recently undergone laboratory on February 24, 2023.  Lipid  studies showed total cholesterol 164, triglycerides 84, HDL 67, and LDL 81 on Zetia.  He developed significant LFT elevation in 1987 with statins.  Continues to be on Zetia and fenofibrate.  He continues to be on amlodipine 10 mg, benazepril 40 mg, HCTZ 25 mg 3 days/week, metoprolol succinate 75 mg, and isosorbide mononitrate 90 mg in the morning and 30 mg at night.  He is not having any anginal symptomatology.  He continues to take pantoprazole for GERD.  He presents for evaluation.   Past Medical History:  Diagnosis Date   Anginal pain (HCC)    Arthritis    "mostly in my hands" (06/25/2012)   CAD (coronary artery disease) 03/25/2007   30 d monitor - sinus rhythm w/ some PACs   DM type 2 (diabetes mellitus, type 2) (HCC)    GERD (gastroesophageal reflux disease)    H/O hiatal hernia    Hyperlipidemia    Hypertension    NSTEMI (non-ST elevated myocardial infarction) (HCC) 06/18/2012   S/P angioplasty with stent, to VG -diag 1, LAD and septal perferator vessels  06/25/2012    Past Surgical History:  Procedure Laterality Date   CARDIAC CATHETERIZATION  06/25/2012   3.5 x 33mm Xience Xpedition DES inserted in distal third of LAD   CARDIOVASCULAR STRESS TEST  11/05/2010   R/S MV - EF 73%; post stress LV fcn normal, no significant wall motion abnormalities; perfusion defect in inferior myocardial region consistent w/ diaphragmatic attenuation, remaining myocardium demonstrates normal perfusion; Exercise capacity 10 METS; stress EKG showed changes from baseline EKG; study results unchanged/within normal variance of last study (09/2008)   CERVICAL DISC SURGERY  1990's   CORONARY ANGIOPLASTY WITH STENT PLACEMENT  06/25/2012   "1; first one for me" (06/25/2012)   CORONARY ARTERY BYPASS GRAFT  10/1997   CABG X  4 LIMA to LAD; vein to diagonal; sequential vein to obtuse marginal and distal circumflex and vein to RCA   CORONARY STENT INTERVENTION  10/23/2020   CORONARY STENT INTERVENTION N/A 10/23/2020    Procedure: CORONARY STENT INTERVENTION;  Surgeon: Lennette Bihari, MD;  Location: [facility] INVASIVE CV LAB;  Service: Cardiovascular;  Laterality: N/A;   DOPPLER ECHOCARDIOGRAPHY  09/23/2011   EF >55%; proximal septal thickening noted; mild septal hypokinesis; ; mild/mod tricuspid regurgitation; aortic root sclerosis/calcification   LEFT HEART CATH AND CORS/GRAFTS ANGIOGRAPHY N/A 10/23/2020   Procedure: LEFT HEART CATH AND CORS/GRAFTS ANGIOGRAPHY;  Surgeon: Lennette Bihari, MD;  Location: [facility] INV[facility] CV [facility];  Service: Cardiovascular;  Laterality: N/A;   LEFT HEART CATHETERIZATION WITH CORONARY/GRAFT ANGIOGRAM N/A 06/21/2012   Procedure: LEFT HEART CATHETERIZATION WITH Isabel Caprice;  Surgeon: Thurmon Fair, MD;  Location: [facility] CATH LAB;  Service: Cardiovascular;  Laterality: N/A;   LEFT HEART CATHETERIZATION WITH CORONARY/GRAFT ANGIOGRAM N/A 08/01/2014   Procedure: LEFT HEART CATHETERIZATION WITH Isabel Caprice;  Surgeon: Lennette Bihari, MD;  Location: [facility] CATH LAB;  Service: Cardiovascular;  Laterality: N/A;   PERCUTANEOUS CORONARY STENT INTERVENTION (PCI-S) N/A 06/25/2012   Procedure: PERCUTANEOUS CORONARY STENT INTERVENTION (PCI-S);  Surgeon: Lennette Bihari, MD;  Location: [facility] CATH LAB;  Service: Cardiovascular;  Laterality: N/A;   TONSILLECTOMY AND ADENOIDECTOMY     "when I was a kid" (06/25/2012)    Allergies  Allergen Reactions   Statins Other (See Comments)    Myositis with statins "affected my liver; I almost had Hepatitis" (06/25/2012)   Welchol [Colesevelam] Other (See Comments)    unknown    Current Outpatient Medications  Medication Sig Dispense Refill   amLODipine (NORVASC) 10 MG tablet TAKE 1 TABLET BY MOUTH DAILY 100 tablet 2   aspirin 81 MG tablet Take 81 mg by mouth daily.     benazepril (LOTENSIN) 40 MG tablet TAKE 1 TABLET BY MOUTH DAILY 100 tablet 2   clopidogrel (PLAVIX) 75 MG tablet TAKE 1 TABLET BY MOUTH DAILY 100 tablet 3   ezetimibe (ZETIA) 10 MG  tablet TAKE 1 TABLET BY MOUTH AT  BEDTIME 90 tablet 3   fenofibrate 160 MG tablet TAKE 1 TABLET BY MOUTH DAILY 100 tablet 2   folic acid (FOLVITE) 1 MG tablet Take one tablet by mouth daily (Patient taking differently: Take 1 mg by mouth daily.) 90 tablet 1   Glycerin-Hypromellose-PEG 400 (DRY EYE RELIEF DROPS) 0.2-0.2-1 % SOLN Place 1 drop into both eyes daily.     hydrochlorothiazide (HYDRODIURIL) 25 MG tablet TAKE 1 TABLET BY MOUTH DAILY 80 tablet 3   isosorbide mononitrate (IMDUR) 60 MG 24 hr tablet TAKE 1 AND 1/2 TABLETS BY MOUTH  EVERY MORNING AND 1 TABLET BY  MOUTH AT BEDTIME (TOTAL OF 2 AND 1/2 TABLETS DAILY) 250 tablet 2   metFORMIN (GLUCOPHAGE) 500 MG tablet Take 500 mg by mouth 2 (two) times daily with a meal.      metoprolol succinate (TOPROL-XL) 50 MG 24 hr tablet TAKE 1 AND 1/2 TABLETS BY MOUTH  DAILY 150 tablet 2   Multiple Vitamins-Minerals (EYE VITAMINS PO) Take 1 tablet by mouth every evening.     pantoprazole (PROTONIX) 40 MG tablet Take 40 mg by mouth daily.     nitroGLYCERIN (NITROSTAT) 0.4 MG SL tablet Place 1 tablet under the tongue every 5 minutes as needed for chest pain (Patient not taking: Reported on 03/27/2023) 25 tablet 3   No current facility-administered medications for this visit.  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 He is married with no children. He does not routinely exercise. There is no tobacco or alcohol use. 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  ROS General: Negative; No fevers, chills, or night sweats;  HEENT: Negative; No changes in vision or hearing, sinus congestion, difficulty swallowing Pulmonary: Negative; No cough, wheezing, shortness of breath, hemoptysis Cardiovascular: See history of present illness GI: Negative; No nausea, vomiting, diarrhea, or abdominal pain GU: Positive for history of BPH; he underwent UroLift implant Musculoskeletal: Positive for bilateral hip bursitis and  arthritis Hematologic/Oncology: Positive for easy bruisability secondary to antiplatelet therapy Endocrine: Positive for type 2 diabetes mellitus; no heat/cold intolerance;  Neuro: Negative; no changes in balance, headaches Skin: Negative; No rashes or skin lesions Psychiatric: Negative; No behavioral problems, depression Sleep: Negative; No snoring, daytime sleepiness, hypersomnolence, bruxism, restless legs, hypnogognic hallucinations, no cataplexy Other comprehensive 14 point system review is negative.   PE BP 132/84   Pulse 76   Ht 5\' 11"  (1.803 m)   Wt 149 lb 6.4 oz (67.8 kg)   SpO2 96%   BMI 20.84 kg/m    Repeat blood pressure by me was 128/80  Wt Readings from Last 3 Encounters:  03/27/23 149 lb 6.4 oz (67.8 kg)  09/09/22 154 lb 9.6 oz (70.1 kg)  03/31/22 156 lb 12.8 oz (71.1 kg)   General: Alert, oriented, no distress.  Skin: normal turgor, no rashes, warm and dry HEENT: Normocephalic, atraumatic. Pupils equal round and reactive to light; sclera anicteric; extraocular muscles intact;  Nose without nasal septal hypertrophy Mouth/Parynx benign; Mallinpatti scale Neck: No JVD, no carotid bruits; normal carotid upstroke Lungs: clear to ausculatation and percussion; no wheezing or rales Chest wall: without tenderness to palpitation Heart: PMI not displaced, RRR, s1 s2 normal, 1-2/6 systolic murmur aortic area, no diastolic murmur, no rubs, gallops, thrills, or heaves Abdomen: soft, nontender; no hepatosplenomehaly, BS+; abdominal aorta nontender and not dilated by palpation. Back: no CVA tenderness; Lipoma on right lower back and also right flank  Pulses 2+ Musculoskeletal: full range of motion, normal strength, no joint deformities Extremities: no clubbing cyanosis or edema, Homan's sign negative  Neurologic: grossly nonfocal; Cranial nerves grossly wnl Psychologic: Normal mood and affect   EKG Interpretation Date/Time:  Friday March 27 2023 09:32:27 EDT Ventricular  Rate:  76 PR Interval:  282 QRS Duration:  100 QT Interval:  380 QTC Calculation: 427 R Axis:   57  Text Interpretation: Sinus rhythm with 1st degree A-V block Nonspecific T wave abnormality When compared with ECG of 24-Oct-2020 07:33, Nonspecific T wave abnormality, improved in Lateral leads Confirmed by Nicki Guadalajara ([id number]) on 03/27/2023 9:51:38 AM    March 31, 2022 ECG (independently read by me): NSR at 71, 1st degree AV block   October 23, 2021 ECG (independently read by me): Normal sinus rhythm at 65 bpm.  First-degree AV block with PR interval 274 ms.  June 14, 2021 ECG (independently read by me): Sinus rhythm at 68, 1st degree AV block; PR 296 msec  October 17, 2020 ECG (independently read by me): Sinus rhythm at 60; 1st degree AV block  July 2021 ECG (independently read by me): NSR at 63; Ist degree AV block; PR 244 msec; no ST changes  February 2019 ECG (independently read by me): Normal sinus rhythm at 68 bpm; first-degree AV block with a PR interval of 250 ms.  No ectopy.  Nonspecific ST changes.  January 2018 ECG (independently read by me): Normal sinus rhythm at 70 bpm with first-degree AV block with a PR interval at 270 ms.  Nonspecific T changes  October 2016 ECG (independently read by me): Normal sinus rhythm with mild sinus arrhythmia at 67 bpm.  First-degree AV block with a PR interval at 238 ms.  December 2015 ECG (independently read by me): Sinus rhythm with first-degree AV block with a PR interval at 244 ms.  No significant ST segment changes.  Prior June 2015 ECG: Normal sinus rhythm at 63 beats per minute.  First degree AV block with PR interval at 236 ms.  No significant ST changes.  LABS: Reviewed his recent emergency room evaluation and laboratory from October 08, 2020  I reviewed laboratory from 07/02/2017.  Hemoglobin 14.1, hematocrit 42.6.  Glucose 142, BUN 19, creatinine 0.96.  Normal LFTs.  Hemoglobin A1c 7.6.  PSA 2.6. Total cholesterol 156, TG 54, HDL  79, VLDL 11, LDL 66.  I reviewed blood work from 12/17/2015 done in [place], [place].   Glucose was 138.  BUN 17, creatinine 0.91.  LFTs normal. Lipid studies revealed cholesterol 150, HDL 66, triglycerides 78, LDL 68,  HbA1c 7.0  November 2017.  Glucose was increased at 146.  BUN was 20 and creatinine 0.96.  Liver function studies were normal.  TSH was normal at 1.45.  CBC was normal.  Lipid studies revealed a total cholesterol 159, triglycerides 65, HDL 73, and LDL 73.  Hemoglobin A1c was minimally increased at 6.6.  PSA was normal.     Latest Ref Rng & Units 10/25/2020    2:06 AM 10/24/2020    2:30 AM 10/19/2020   12:40 PM  BMP  Glucose 70 - 99 mg/dL 161  096  045   BUN 8 - 23 mg/dL 15  15  15    Creatinine 0.61 - 1.24 mg/dL 4.09  8.11  9.14   BUN/Creat Ratio 10 - 24   14   Sodium 135 - 145 mmol/L 138  141  142   Potassium 3.5 - 5.1 mmol/L 3.5  3.6  4.7   Chloride 98 - 111 mmol/L 109  110  105   CO2 22 - 32 mmol/L 23  25  23    Calcium 8.9 - 10.3 mg/dL 8.5  8.9  9.8       Latest Ref Rng & Units 07/31/2014   12:24 PM 06/21/2012    9:25 AM  Hepatic Function  Total Protein 6.0 - 8.3 g/dL 7.0  6.7   Albumin 3.5 - 5.2 g/dL 4.7  3.5   AST 0 - 37 U/L 18  34   ALT 0 - 53 U/L 20  27   Alk Phosphatase 39 - 117 U/L 50  65   Total Bilirubin 0.2 - 1.2 mg/dL 0.5  0.4   Bilirubin, Direct 0.0 - 0.3 mg/dL  <7.8       Latest Ref Rng & Units 10/24/2020    2:30 AM 10/19/2020   12:40 PM 10/08/2020    4:32 PM  CBC  WBC 4.0 - 10.5 K/uL 8.9  5.5  6.2   Hemoglobin 13.0 - 17.0 g/dL 29.5  62.1  30.8   Hematocrit 39.0 - 52.0 % 33.0  41.0  40.4   Platelets 150 - 400 K/uL 181  192  208    Lab Results  Component Value Date   MCV 86.2 10/24/2020   MCV 89 10/19/2020   MCV 89.8 10/08/2020   Lab Results  Component Value Date   TSH 1.168 07/31/2014   Lab Results  Component Value Date   HGBA1C 7.5 (H) 10/23/2020   Lipid Panel     Component Value Date/Time   CHOL 133 10/25/2020 0206    TRIG 80 10/25/2020 0206   HDL 54 10/25/2020 0206   CHOLHDL 2.5 10/25/2020 0206   VLDL 16 10/25/2020 0206   LDLCALC 63 10/25/2020 0206    RADIOLOGY: No results found.  IMPRESSION:  1. Primary hypertension   2. CAD in native artery: s/p PCIs to SVG - Dx:  2013 and 2022   3. Hx of CABG   4. Aortic valve stenosis, etiology of cardiac valve disease unspecified   5. Statin intolerance   6. Hyperlipidemia LDL goal <70   7. Type 2 diabetes mellitus with complication, without long-term current use of insulin (HCC)   8. Macular degeneration of left eye, unspecified type     ASSESSMENT AND PLAN: Daryl. Swartley is a young appearing 89 year-old white male who is status post CABG revascularization surgery in 1999. 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  He was transitioned off Brilinta to Plavix and had verification of Plavix sensitivity by P2Y12 testing.  In December 2015 repeat catheterization showed severe native CAD with patent grafts.  He  had complete resolution of symptomatology with his increased nitrates and beta blocker therapy.  In the past, he developed marked LFT elevation with Mevacor in 1987  and almost underwent liver biopsy.  He has not been on statin since that time.  In 2017 an echo Doppler study for evaluation of a cardiac murmur demonstrated normal LV function with grade 1 diastolic dysfunction.  There was mild Daryl and normal PA pressures.  There was no evidence for aortic stenosis.  When I saw him in March 2022 he was experiencing recurrent similar anginal symptomatology.  On repeat catheterization  he was found to have a new high-grade stenosis in the proximal LAD and of the graft supplying the diagonal vessel with the previously placed stent in the distal portion of the graft remaining widely open.  He also had 75% stenosis in the  sequential limb of the vein graft between the OM1 and OM 2 vessel which was treated medically.  His LIMA to LAD was patent as was his SVG to RCA.  Since last intervention, Daryl. Dubow has remained stable without chest pain or shortness of breath.  His blood pressure today is stable on his blood pressure/anti-ischemic medical regimen consisting of amlodipine 10 mg, benazepril 40 mg, HCTZ 12.5 mg 3 days a week, isosorbide 90 mg in the morning and 60 mg at night in addition to metoprolol succinate 75 mg daily.  He continues to be on Zetia and fenofibrate for hyperlipidemia.  Most recent lipid panel is stable with LDL at 81.  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  He has developed macular degeneration and will need left eye surgery.  He will contact his ophthalmologist and may need to be off aspirin/Plavix for at least 5 days prior to surgery.  Exam he does have mild aortic stenosis.  I have recommended a follow-up echo Doppler study be done in February 2025.  He is diabetic on metformin 500 mg twice a day with recent hemoglobin A1c at 7.2 I will see him in February/March 2025 for follow-up evaluation.Marland Kitchen  Lennette Bihari, MD, [facility]  03/27/2023 6:28 PM

## 2023-03-27 ENCOUNTER — Ambulatory Visit: Payer: Medicare Other | Attending: Cardiovascular Disease | Admitting: Cardiovascular Disease

## 2023-03-27 ENCOUNTER — Encounter: Payer: Self-pay | Admitting: Cardiovascular Disease

## 2023-03-27 DIAGNOSIS — H3530 Unspecified macular degeneration: Secondary | ICD-10-CM

## 2023-03-27 DIAGNOSIS — I10 Essential (primary) hypertension: Secondary | ICD-10-CM

## 2023-03-27 DIAGNOSIS — Z951 Presence of aortocoronary bypass graft: Secondary | ICD-10-CM | POA: Diagnosis not present

## 2023-03-27 DIAGNOSIS — E785 Hyperlipidemia, unspecified: Secondary | ICD-10-CM

## 2023-03-27 DIAGNOSIS — I2510 Atherosclerotic heart disease of native coronary artery without angina pectoris: Secondary | ICD-10-CM | POA: Diagnosis not present

## 2023-03-27 DIAGNOSIS — Z789 Other specified health status: Secondary | ICD-10-CM

## 2023-03-27 DIAGNOSIS — I350 Nonrheumatic aortic (valve) stenosis: Secondary | ICD-10-CM | POA: Diagnosis not present

## 2023-03-27 DIAGNOSIS — E118 Type 2 diabetes mellitus with unspecified complications: Secondary | ICD-10-CM

## 2023-03-27 NOTE — Patient Instructions (Signed)
Medication Instructions:  *If you need a refill on your cardiac medications before your next appointment, please call your pharmacy*   Lab Work: If you have labs (blood work) drawn today and your tests are completely normal, you will receive your results only by: MyChart Message (if you have MyChart) OR A paper copy in the mail If you have any lab test that is abnormal or we need to change your treatment, we will call you to review the results.   Testing/Procedures: Your physician has requested that you have an echocardiogram. Echocardiography is a painless test that uses sound waves to create images of your heart. It provides your doctor with information about the size and shape of your heart and how well your heart's chambers and valves are working. This procedure takes approximately one hour. There are no restrictions for this procedure. Please do NOT wear cologne,  aftershave, or lotions (deodorant is allowed).  Please arrive 15 minutes prior to your appointment time.    Follow-Up: At [facility], you and your health needs are our priority.  As part of our continuing mission to provide you with exceptional heart care, we have created designated Provider Care Teams.  These Care Teams include your primary Cardiologist (physician) and Advanced Practice Providers (APPs -  Physician Assistants and Nurse Practitioners) who all work together to provide you with the care you need, when you need it.  We recommend signing up for the patient portal called "MyChart".  Sign up information is provided on this After Visit Summary.  MyChart is used to connect with patients for Virtual Visits (Telemedicine).  Patients are able to view lab/test results, encounter notes, upcoming appointments, etc.  Non-urgent messages can be sent to your provider as well.   To learn more about what you can do with MyChart, go to [email or web address].    Your next appointment:   6 month(s)  Provider:    Nicki Guadalajara, MD

## 2023-06-02 ENCOUNTER — Other Ambulatory Visit: Payer: Self-pay | Admitting: Cardiovascular Disease

## 2023-09-04 ENCOUNTER — Other Ambulatory Visit: Payer: Self-pay | Admitting: Cardiovascular Disease

## 2023-09-08 ENCOUNTER — Ambulatory Visit (HOSPITAL_COMMUNITY): Payer: Medicare Other | Attending: Cardiovascular Disease

## 2023-09-08 DIAGNOSIS — I350 Nonrheumatic aortic (valve) stenosis: Secondary | ICD-10-CM | POA: Diagnosis present

## 2023-09-08 LAB — ECHOCARDIOGRAM COMPLETE
AR max vel: 1.26 cm2
AV Area VTI: 1.18 cm2
AV Area mean vel: 1.31 cm2
AV Mean grad: 11 mm[Hg]
AV Peak grad: 21.2 mm[Hg]
Ao pk vel: 2.3 m/s
Area-P 1/2: 4.52 cm2
S' Lateral: 2.6 cm

## 2023-09-24 ENCOUNTER — Ambulatory Visit: Payer: Medicare Other | Admitting: Cardiovascular Disease

## 2023-11-13 ENCOUNTER — Other Ambulatory Visit: Payer: Self-pay | Admitting: Cardiovascular Disease

## 2023-12-02 ENCOUNTER — Other Ambulatory Visit: Payer: Self-pay | Admitting: Cardiovascular Disease

## 2023-12-17 ENCOUNTER — Other Ambulatory Visit: Payer: Self-pay | Admitting: Cardiovascular Disease

## 2023-12-21 ENCOUNTER — Ambulatory Visit: Payer: Medicare Other | Attending: Cardiovascular Disease | Admitting: Cardiovascular Disease

## 2023-12-21 ENCOUNTER — Encounter: Payer: Self-pay | Admitting: Cardiovascular Disease

## 2023-12-21 VITALS — BP 136/78 | HR 70 | Ht 71.0 in | Wt 150.8 lb

## 2023-12-21 DIAGNOSIS — I350 Nonrheumatic aortic (valve) stenosis: Secondary | ICD-10-CM | POA: Diagnosis not present

## 2023-12-21 DIAGNOSIS — E118 Type 2 diabetes mellitus with unspecified complications: Secondary | ICD-10-CM

## 2023-12-21 DIAGNOSIS — I2510 Atherosclerotic heart disease of native coronary artery without angina pectoris: Secondary | ICD-10-CM

## 2023-12-21 DIAGNOSIS — Z951 Presence of aortocoronary bypass graft: Secondary | ICD-10-CM

## 2023-12-21 DIAGNOSIS — I10 Essential (primary) hypertension: Secondary | ICD-10-CM

## 2023-12-21 DIAGNOSIS — R600 Localized edema: Secondary | ICD-10-CM

## 2023-12-21 DIAGNOSIS — E785 Hyperlipidemia, unspecified: Secondary | ICD-10-CM

## 2023-12-21 DIAGNOSIS — H3530 Unspecified macular degeneration: Secondary | ICD-10-CM

## 2023-12-21 DIAGNOSIS — Z789 Other specified health status: Secondary | ICD-10-CM

## 2023-12-21 NOTE — Progress Notes (Signed)
 Patient ID: Daryl Joseph, male   DOB: [birth year], 89 y.o.   MRN: [id number]      HPI: Daryl Joseph is a 89 y.o. male who presents to the office today for a 9 month cardiology evaluation.  Daryl Joseph has established CAD and in 1999 underwent CABG surgery with a LIMA to the LAD, vein graft to diagonal, sequential vein graft to the obtuse marginal distal circumflex coronary artery and vein graft to the RCA. 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 In November 2013 he suffered a non-ST segment elevation myocardial infarction and underwent initial catheterization by [name]. I ultimately performed intervention a high-grade ulcerated plaque graft stenosis supplying a diagonal vessel on 06/25/2012 and a 3.5x33 mm  Xience Xpedition DES stent was inserted and post dilated 3.9 mm. Once his graft was opened, the proximal LAD was now significantly improved with reference to being filled from the diagonal vessel.    On a one year followup nuclear perfusion study he had held his metoprolol , amlodipine  and isosorbide  for the stress test. He did not develop any chest pain. However, he did have an exaggerated hypertensive blood pressure response to exercise and developed 3 mm ST segment depression. Scintigraphic images were low risk and specifically he had normal flow in the diagonal vessel territory.   In December 2014, he was concerned about the price of Brilinta .  Since his been 13 months since his ACS intervention, I switched him to Plavix  and P2Y12 testing verifiend plavix  sensitivity.   He has been on fenofibrate  and Zetia  for hyperlipidemia in light of his statin hypersensitivity.  Laboratory in March 2015 revealed a cholesterol was 177, triglycerides 68, HDL 68, LDL 95.  Hemoglobin A1c was 7.0.  He went 21, creatinine 0.88.  Normal LFTs.  He brought with him.  Laboratory which was done in [place], [place]   on 04/18/2014.  I reviewed this in detail with him.  He had normal thyroid function studies.  Hemoglobin 14.8, hematocrit 44.4.  Glucose was 142.  LDL cholesterol was 81 with total cholesterol 165, HDL 67, triglycerides 83.  Hemoglobin A1c was increased at 7.4.  PSA was 2.32.  When I saw him in December 2015 he complained of developing similar chest tightness with walking over several weeks, which was somewhat nitrate responsive.  He underwent repeat cardiac catheterization on 08/01/2014 which showedLow normal LV function with an ejection fraction of 50-55% and mild distal inferior hypocontractility. There was severe native CAD with 80% distal left main stenosis and total occlusion of the LAD in its ostium, 95% stenosis in the OM 2 vessel the circumflex with 30% narrowing in the OM1 branch proximal to the graft anastomoses, and total occlusion of the mid RCA. He had a patent LIMA graft supplying the mid LAD with 30-40% smooth mid LAD stenosis beyond the anastomosis., a widely patent stent in the mid distal aspect of the SVG supplying the diagonal vessel without evidence for restenosis., a patent sequential vein graft supplying the OM1 and OM 2 vessel with smooth 20% narrowing in the proximal portion of the sequential limb just beyond the OM1 anastomosis and a patent SVG supplying a dominant RCA with 80% mid PDA stenosis and a small PDA caliber vessel.  Increased medical therapy was recommended with titration of his nitrates and beta blocker and possible initiation of Ranexa.  Since I saw him, Daryl Joseph has felt well without recurrent anginal symptomatology  or significant shortness of breath on his increased medical regimen.  He denies recurrent anginal symptoms.  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  He has GERD and his Prilosec was changed to Protonix  due to potential Plavix  interaction.  He denies any presyncope or syncope, PND, orthopnea.    An echo Doppler study  in August 2017 to further evaluate his cardiac murmur demonstrated normal systolic function with grade 1 diastolic dysfunction.  There was mild aortic sclerosis.  Mean gradient 5 peak gradient 11.  There was mild Daryl.  Had normal pulmonic pressures.  There was mild TR.    He underwent laboratory by his primary physician in November 2017.  Glucose was increased at 146.  BUN was 20 and creatinine 0.96.  Liver function studies were normal.  TSH was normal at 1.45.  CBC was normal.  Lipid studies revealed a total cholesterol 159, triglycerides 65, HDL 73, and LDL 73.  Hemoglobin A1c was minimally increased at 6.6.  PSA was normal.    I saw him in February 2019.  At that time, he was having issues with bursitis and arthritis of his hip and has undergone 3 steroid injections.   Laboratory in November 2018 was done at the time he was having steroid injections which may have contributed to his glucose being elevated at 142, and hemoglobin A1c increased at 7.6.  He has continued to be on Zetia  for hyperlipidemia, and remotely had LFT elevation with Mevacor.  Lipid studies were very good on Zetia  with a total cholesterol 156, triglycerides 54, HDL 79, and LDL 66.  He normal LFTs and renal function.    His follow-up appointment had been rescheduled due to the COVID pandemic.  I  saw him in August 2020 after a 71-month interval.  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  He had laboratory on January 21, 2019 which showed a total cholesterol 158, HDL 61, LDL 79.  Glucose was 134.  He had normal renal function with a creatinine of 0.98.  LFTs were normal.  Hemoglobin A1c was 7.0.  He was on amlodipine  10 mg, benazepril  40 mg, isosorbide  60 mg, and Toprol -XL 75 mg.  His blood pressure at home typically is in the 130s.  When his laboratory was checked, blood pressure was 138/63 in June at his primary care office.  He was  on Zetia  and fenofibrate  for hyperlipidemia, metformin  500 mg twice a day for diabetes mellitus and pantoprazole  for GERD. At times he has noticed some intermittent ankle swelling. He tries to watch his sodium intake but at times he does get some excess sodium in certain foods.    When I saw him in July 2021 he continued to be stable from a cardiac standpoint.  Unfortunately he had a  right knee staph infection and was operated on in [place] in April 2021.  At that time he had an echo Doppler done and this apparently was without significant abnormality.  He has had issues with an enlarged prostate and is seeing urologist in [place] who was  considering possible UroLift surgery.  He denied anginal symptoms, PND, or orthopnea.  At times he notes mild ankle swelling.    I saw him on October 17, 2020 and prior to that evaluation he had a COVID infection 2 months previously with symptoms of cough and runny  nose.  Subsequently he did develop some vague chest tightness and pressure.  He was evaluated in the emergency room on October 08, 2020.  Blood pressure was elevated.  Troponins were negative.  Subsequently, he continued to experience episodes of chest pressure and tightness which mimic his previous anginal symptomatology when he was found to have high-grade stenosis in his vein graft.  He was worked into my schedule  for further evaluation.  With his recurrent symptomatology, now 23 years status post CABG revascularization surgery I recommended definitive repeat cardiac catheterization which was done by me on October 23, 2020.  Catheterization revealed severe multivessel native CAD with 95% distal left main stenosis, total ostial occlusion of the LAD, 80% stenosis in a small ramus vessel, 40 to 50% proximal circumflex stenosis filling to the marginals and severely diseased proximal to mid RCA with subtotal/total distal occlusion.  His LIMA supplying the LAD was patent.  The sequential vein graft supplying the OM1 and  distal circumflex had mild 20% focal narrowing in the proximal third of the graft but had 75% eccentric stenosis of the sequential limb of the graft immediately after the OM1 takeoff.  The saphenous vein graft supplying the diagonal vessel had a widely patent previously placed stent in the mid distal segment but there was a new 90% smooth focal stenosis in the proximal body of the graft.  He underwent successful PCI to the proximal vein graft supplying the diagonal vessel with insertion of a 3.5 x 16 mm Synergy DES stent postdilated 3.74 mm.  Subsequent to his catheterization and intervention, he has felt well and denies any recurrent symptomatology.  He was evaluated by Sharren Decree, PA-C on November 12, 2020 and remained stable.  I saw him on November 2022.  At that time he felt well.  He completed 12 weeks of cardiac rehab.  Since completion, he goes to the fitness center at least 3 days/week and exercises on the treadmill for at least 45 minutes.  He feels significantly improved since insertion of his last stent.  He continues to be on aspirin  and Plavix  for DAPT.  He is on amlodipine  10 mg, isosorbide  90 mg in the morning and 60 mg at bedtime, metoprolol  succinate 75 mg daily, HCTZ 12.5 mg and pantoprazole .  For that evaluation he was given a flu shot in the office.  Also due to increasing lower extremity edema I suggest that he increase hydrochlorothiazide  from 12.5 mg for the next 3 days to 25 mg and then take either 12.5 or 25 mg depending upon his leg swelling  I saw him on October 23 2021.  Since his prior evaluation he has continued to feel well.   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  He denies any recurrent anginal symptoms.  He denies shortness of breath.  He had laboratory 2 months previously.  During that evaluation, his blood pressure was stable on his medical regimen of amlodipine  10 mg, benazepril  40 mg, HCTZ  which she was taking 25 mg every day, metoprolol  succinate 75 mg, in addition to his isosorbide  90 mg in the morning and 60 mg in the evening.  He continues to be on DAPT with aspirin /Plavix  continue to be on fenofibrate  and Zetia  with his history of prior statin intolerance.  I saw him on March 31, 2022.  Since his prior evaluation he had issues with sciatica and recently underwent  injection to his left hip region by Dr. Loria Rong with improvement.  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  He remains asymptomatic.  At times he has noticed some trace swelling of his ankles.  Laboratory in July 2023 was stable with normal LFTs.  LDL was 62.  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  He walks regularly.  He denies chest pain or shortness of breath he continues to be on long-term DAPT with aspirin /Plavix .  He is on Zetia  and fenofibrate  for his hyperlipidemia and remotely had developed statin induced liver toxicity secondary to Mevacor in 1987 when I had initially seen him.  He remains asymptomatic and is without chest pain, shortness of breath, presyncope, palpitations or syncope.  Lipid studies had increased slightly since his prior evaluation with total cholesterol 175, triglycerides 84 HDL 80 and LDL 80.  He admitted that his diet had not been extrinsic as it was in the past and he would improve this.  He also has established primary care with Marveen Slick, PA in [place].  I last saw him on March 27, 2023.  Daryl Joseph remains asymptomatic.  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  If so he he may need to hold Plavix  and aspirin  and will check with his ophthalmologist.  He is unaware of any palpitations.  He remains active.  He had  recently undergone laboratory on February 24, 2023.  Lipid studies showed total cholesterol 164, triglycerides 84, HDL 67, and LDL 81 on Zetia .  He developed significant LFT elevation in 1987 with statins.  Continues to be on Zetia  and fenofibrate .  He continues to be on amlodipine  10 mg, benazepril  40 mg, HCTZ 25 mg 3 days/week, metoprolol  succinate 75 mg, and isosorbide  mononitrate 90 mg in the morning and 30 mg at night.  He is not having any anginal symptomatology.  He continues to take pantoprazole  for GERD.  We discussed being off of aspirin  and Plavix  for 5 days prior to his eye surgery.  Since I last saw him, he underwent a 2D echo Doppler study on September 08, 2023.  LV function remained normal with EF at 55 to 60%.  He had normal diastolic function, wall motion and strain pattern.  He had calcification of his aortic valve with mean aortic gradient of 11 and peak gradient 21.2 mmHg.  Aortic valve area by VTI was 1.18 cm.   Presently, he feels well.  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  He has noticed easy bruisability as well as trace ankle edema.  He continues to be on amlodipine  10 mg, benazepril  40 mg, HCTZ she takes 25 mg 4 days a week, and metoprolol  succinate 75 mg daily.  He is on isosorbide  90 mg in the morning and 60 mg at night.  He has continued to be on long-term DAPT with aspirin /Plavix .  He is on Zetia  10 mg, and fenofibrate  for lipid therapy with previous significant LFT elevation in 1987 on Mevacor.  He is here with his wife and presents for evaluation.   Past Medical History:  Diagnosis Date   Anginal pain (HCC)  Arthritis    "mostly in my hands" (06/25/2012)   CAD (coronary artery disease) 03/25/2007   30 d monitor - sinus rhythm w/ some PACs   DM type 2 (diabetes mellitus, type 2) (HCC)    GERD (gastroesophageal reflux disease)    H/O hiatal hernia    Hyperlipidemia    Hypertension    NSTEMI (non-ST elevated myocardial infarction)  (HCC) 06/18/2012   S/P angioplasty with stent, to VG -diag 1, LAD and septal perferator vessels  06/25/2012    Past Surgical History:  Procedure Laterality Date   CARDIAC CATHETERIZATION  06/25/2012   3.5 x 33mm Xience Xpedition DES inserted in distal third of LAD   CARDIOVASCULAR STRESS TEST  11/05/2010   R/S MV - EF 73%; post stress LV fcn normal, no significant wall motion abnormalities; perfusion defect in inferior myocardial region consistent w/ diaphragmatic attenuation, remaining myocardium demonstrates normal perfusion; Exercise capacity 10 METS; stress EKG showed changes from baseline EKG; study results unchanged/within normal variance of last study (09/2008)   CERVICAL DISC SURGERY  1990's   CORONARY ANGIOPLASTY WITH STENT PLACEMENT  06/25/2012   "1; first one for me" (06/25/2012)   CORONARY ARTERY BYPASS GRAFT  10/1997   CABG X 4 LIMA to LAD; vein to diagonal; sequential vein to obtuse marginal and distal circumflex and vein to RCA   CORONARY STENT INTERVENTION  10/23/2020   CORONARY STENT INTERVENTION N/A 10/23/2020   Procedure: CORONARY STENT INTERVENTION;  Surgeon: Millicent Ally, MD;  Location: [facility] INVASIVE CV LAB;  Service: Cardiovascular;  Laterality: N/A;   DOPPLER ECHOCARDIOGRAPHY  09/23/2011   EF >55%; proximal septal thickening noted; mild septal hypokinesis; ; mild/mod tricuspid regurgitation; aortic root sclerosis/calcification   LEFT HEART CATH AND CORS/GRAFTS ANGIOGRAPHY N/A 10/23/2020   Procedure: LEFT HEART CATH AND CORS/GRAFTS ANGIOGRAPHY;  Surgeon: Millicent Ally, MD;  Location: [facility] INVASIVE CV LAB;  Service: Cardiovascular;  Laterality: N/A;   LEFT HEART CATHETERIZATION WITH CORONARY/GRAFT ANGIOGRAM N/A 06/21/2012   Procedure: LEFT HEART CATHETERIZATION WITH Estella Helling;  Surgeon: Luana Rumple, MD;  Location: [facility] CATH LAB;  Service: Cardiovascular;  Laterality: N/A;   LEFT HEART CATHETERIZATION WITH CORONARY/GRAFT ANGIOGRAM N/A 08/01/2014   Procedure:  LEFT HEART CATHETERIZATION WITH Estella Helling;  Surgeon: Millicent Ally, MD;  Location: [facility] CATH LAB;  Service: Cardiovascular;  Laterality: N/A;   PERCUTANEOUS CORONARY STENT INTERVENTION (PCI-S) N/A 06/25/2012   Procedure: PERCUTANEOUS CORONARY STENT INTERVENTION (PCI-S);  Surgeon: Millicent Ally, MD;  Location: [facility] LAB;  Service: Cardiovascular;  Laterality: N/A;   TONSILLECTOMY AND ADENOIDECTOMY     "when I was a kid" (06/25/2012)    Allergies  Allergen Reactions   Statins Other (See Comments)    Myositis with statins "affected my liver; I almost had Hepatitis" (06/25/2012)   Welchol [Colesevelam] Other (See Comments)    unknown    Current Outpatient Medications  Medication Sig Dispense Refill   amLODipine  (NORVASC ) 10 MG tablet TAKE 1 TABLET BY MOUTH DAILY 100 tablet 0   aspirin  81 MG tablet Take 81 mg by mouth daily.     benazepril  (LOTENSIN ) 40 MG tablet TAKE 1 TABLET BY MOUTH DAILY 100 tablet 0   clopidogrel  (PLAVIX ) 75 MG tablet TAKE 1 TABLET BY MOUTH DAILY 100 tablet 3   ezetimibe  (ZETIA ) 10 MG tablet TAKE 1 TABLET BY MOUTH AT  BEDTIME 100 tablet 1   fenofibrate  160 MG tablet TAKE 1 TABLET BY MOUTH DAILY 90 tablet 2   folic acid  (FOLVITE ) 1  MG tablet Take one tablet by mouth daily (Patient taking differently: Take 1 mg by mouth daily.) 90 tablet 1   Glycerin -Hypromellose-PEG 400 (DRY EYE RELIEF DROPS) 0.2-0.2-1 % SOLN Place 1 drop into both eyes daily.     hydrochlorothiazide  (HYDRODIURIL ) 25 MG tablet TAKE 1 TABLET BY MOUTH DAILY 80 tablet 3   isosorbide  mononitrate (IMDUR ) 60 MG 24 hr tablet TAKE 1 AND 1/2 TABLETS BY MOUTH  EVERY MORNING AND 1 TABLET AT  BEDTIME (TOTAL OF 2 AND 1/2  TABLETS DAILY) 225 tablet 0   metFORMIN  (GLUCOPHAGE ) 500 MG tablet Take 500 mg by mouth 2 (two) times daily with a meal.      metoprolol  succinate (TOPROL -XL) 50 MG 24 hr tablet TAKE 1 AND 1/2 TABLETS BY MOUTH  DAILY 135 tablet 2   Multiple Vitamins-Minerals (EYE VITAMINS PO) Take 1  tablet by mouth every evening.     nitroGLYCERIN  (NITROSTAT ) 0.4 MG SL tablet Place 1 tablet under the tongue every 5 minutes as needed for chest pain 25 tablet 3   pantoprazole  (PROTONIX ) 40 MG tablet Take 40 mg by mouth daily.     No current facility-administered medications for this visit.    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 He is married with no children. He does not routinely exercise. There is no tobacco or alcohol use. 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  ROS General: Negative; No fevers, chills, or night sweats;  HEENT: Negative; No changes in vision or hearing, sinus congestion, difficulty swallowing Pulmonary: Negative; No cough, wheezing, shortness of breath, hemoptysis Cardiovascular: See history of present illness GI: Negative; No nausea, vomiting, diarrhea, or abdominal pain GU: Positive for history of BPH; he underwent UroLift implant Musculoskeletal: Positive for bilateral hip bursitis and arthritis Hematologic/Oncology: Positive for easy bruisability secondary to antiplatelet therapy Endocrine: Positive for type 2 diabetes mellitus; no heat/cold intolerance;  Neuro: Negative; no changes in balance, headaches Skin: Negative; No rashes or skin lesions Psychiatric: Negative; No behavioral problems, depression Sleep: Negative; No snoring, daytime sleepiness, hypersomnolence, bruxism, restless legs, hypnogognic hallucinations, no cataplexy Other comprehensive 14 point system review is negative.   PE BP 136/78   Pulse 70   Ht 5\' 11"  (1.803 m)   Wt 150 lb 12.8 oz (68.4 kg)   SpO2 96%   BMI 21.03 kg/m    Repeat blood pressure by me was 150/78  Wt Readings from Last 3 Encounters:  12/21/23 150 lb 12.8 oz (68.4 kg)  03/27/23 149 lb 6.4 oz (67.8 kg)  09/09/22 154 lb 9.6 oz (70.1 kg)   General: Alert, oriented, no distress.  Skin: normal turgor, no rashes, warm and dry; tanned HEENT: Normocephalic,  atraumatic. Pupils equal round and reactive to light; sclera anicteric; extraocular muscles intact; Fundi ** Nose without nasal septal hypertrophy Mouth/Parynx benign; Mallinpatti scale Neck: No JVD, no carotid bruits; normal carotid upstroke Lungs: clear to ausculatation and percussion; no wheezing or rales Chest wall: without tenderness to palpitation Heart: PMI not displaced, RRR, s1 s2 normal, 2/6 systolic murmur aortic area, left sternal border, 1/6 at apex, no diastolic murmur, no rubs, gallops, thrills, or heaves Abdomen: Diastases recti ; soft, nontender; no hepatosplenomehaly, BS+; abdominal aorta nontender and not dilated by palpation. Back: no CVA tenderness; lipoma on right lower back and right flank Pulses 2+ Musculoskeletal: full range of motion, normal strength, no joint deformities Extremities: no clubbing cyanosis or edema, Homan's sign negative  Neurologic: grossly  nonfocal; Cranial nerves grossly wnl Psychologic: Normal mood and affect     EKG Interpretation Date/Time:  Monday Dec 21 2023 10:56:46 EDT Ventricular Rate:  70 PR Interval:  292 QRS Duration:  92 QT Interval:  390 QTC Calculation: 421 R Axis:   25  Text Interpretation: Sinus rhythm with 1st degree A-V block When compared with ECG of 27-Mar-2023 09:32, No significant change was found Confirmed by Magnus Schuller ([id number]) on 12/21/2023 11:58:02 AM    March 31, 2022 ECG (independently read by me): NSR at 71, 1st degree AV block   October 23, 2021 ECG (independently read by me): Normal sinus rhythm at 65 bpm.  First-degree AV block with PR interval 274 ms.  June 14, 2021 ECG (independently read by me): Sinus rhythm at 68, 1st degree AV block; PR 296 msec  October 17, 2020 ECG (independently read by me): Sinus rhythm at 60; 1st degree AV block  July 2021 ECG (independently read by me): NSR at 63; Ist degree AV block; PR 244 msec; no ST changes  February 2019 ECG (independently read by me): Normal sinus  rhythm at 68 bpm; first-degree AV block with a PR interval of 250 ms.  No ectopy.  Nonspecific ST changes.  January 2018 ECG (independently read by me): Normal sinus rhythm at 70 bpm with first-degree AV block with a PR interval at 270 ms.  Nonspecific T changes  October 2016 ECG (independently read by me): Normal sinus rhythm with mild sinus arrhythmia at 67 bpm.  First-degree AV block with a PR interval at 238 ms.  December 2015 ECG (independently read by me): Sinus rhythm with first-degree AV block with a PR interval at 244 ms.  No significant ST segment changes.  Prior June 2015 ECG: Normal sinus rhythm at 63 beats per minute.  First degree AV block with PR interval at 236 ms.  No significant ST changes.  LABS: Reviewed his recent emergency room evaluation and laboratory from October 08, 2020  I reviewed laboratory from 07/02/2017.  Hemoglobin 14.1, hematocrit 42.6.  Glucose 142, BUN 19, creatinine 0.96.  Normal LFTs.  Hemoglobin A1c 7.6.  PSA 2.6. Total cholesterol 156, TG 54, HDL 79, VLDL 11, LDL 66.  I reviewed blood work from 12/17/2015 done in [place], [place] .   Glucose was 138.  BUN 17, creatinine 0.91.  LFTs normal. Lipid studies revealed cholesterol 150, HDL 66, triglycerides 78, LDL 68,  HbA1c 7.0  November 2017.  Glucose was increased at 146.  BUN was 20 and creatinine 0.96.  Liver function studies were normal.  TSH was normal at 1.45.  CBC was normal.  Lipid studies revealed a total cholesterol 159, triglycerides 65, HDL 73, and LDL 73.  Hemoglobin A1c was minimally increased at 6.6.  PSA was normal.     Latest Ref Rng & Units 10/25/2020    2:06 AM 10/24/2020    2:30 AM 10/19/2020   12:40 PM  BMP  Glucose 70 - 99 mg/dL 191  478  295   BUN 8 - 23 mg/dL 15  15  15    Creatinine 0.61 - 1.24 mg/dL 6.21  3.08  6.57   BUN/Creat Ratio 10 - 24   14   Sodium 135 - 145 mmol/L 138  141  142   Potassium 3.5 - 5.1 mmol/L 3.5  3.6  4.7   Chloride 98 - 111 mmol/L 109  110  105    CO2 22 - 32 mmol/L 23  25  23  Calcium 8.9 - 10.3 mg/dL 8.5  8.9  9.8       Latest Ref Rng & Units 07/31/2014   12:24 PM 06/21/2012    9:25 AM  Hepatic Function  Total Protein 6.0 - 8.3 g/dL 7.0  6.7   Albumin 3.5 - 5.2 g/dL 4.7  3.5   AST 0 - 37 U/L 18  34   ALT 0 - 53 U/L 20  27   Alk Phosphatase 39 - 117 U/L 50  65   Total Bilirubin 0.2 - 1.2 mg/dL 0.5  0.4   Bilirubin, Direct 0.0 - 0.3 mg/dL  <1.6       Latest Ref Rng & Units 10/24/2020    2:30 AM 10/19/2020   12:40 PM 10/08/2020    4:32 PM  CBC  WBC 4.0 - 10.5 K/uL 8.9  5.5  6.2   Hemoglobin 13.0 - 17.0 g/dL 10.9  60.4  54.0   Hematocrit 39.0 - 52.0 % 33.0  41.0  40.4   Platelets 150 - 400 K/uL 181  192  208    Lab Results  Component Value Date   MCV 86.2 10/24/2020   MCV 89 10/19/2020   MCV 89.8 10/08/2020   Lab Results  Component Value Date   TSH 1.168 07/31/2014   Lab Results  Component Value Date   HGBA1C 7.5 (H) 10/23/2020   Lipid Panel     Component Value Date/Time   CHOL 133 10/25/2020 0206   TRIG 80 10/25/2020 0206   HDL 54 10/25/2020 0206   CHOLHDL 2.5 10/25/2020 0206   VLDL 16 10/25/2020 0206   LDLCALC 63 10/25/2020 0206    RADIOLOGY: No results found.  IMPRESSION:  1. Primary hypertension   2. CAD in native artery: s/p PCIs to SVG - Dx:  2013 and 2022   3. Hx of CABG   4. Aortic valve stenosis, etiology of cardiac valve disease unspecified   5. Statin intolerance   6. Type 2 diabetes mellitus with complication, without long-term current use of insulin  (HCC)   7. Hyperlipidemia LDL goal < 55   8. Lower extremity edema   9. Macular degeneration of left eye, unspecified type    ASSESSMENT AND PLAN: Daryl Joseph is a young appearing 89 year-old white male who is status post CABG revascularization surgery in 1999. In November 2013 he suffered a non-ST segment elevation myocardial infarction secondary to a high-grade stenosis in the graft supplying his diagonal vessel which was successfully  intervened on by me.  He was transitioned off Brilinta  to Plavix  and had verification of Plavix  sensitivity by P2Y12 testing.  In December 2015 repeat catheterization showed severe native CAD with patent grafts.  He had complete resolution of symptomatology with his increased nitrates and beta blocker therapy.  In the past, he developed marked LFT elevation with Mevacor in 1987  and almost underwent liver biopsy.  He has not been on statin since that time.  In 2017 an echo Doppler study for evaluation of a cardiac murmur demonstrated normal LV function with grade 1 diastolic dysfunction.  There was mild Daryl and normal PA pressures.  There was no evidence for aortic stenosis.  When I saw him in March 2022 he was experiencing recurrent similar anginal symptomatology.  At repeat catheterization  he was found to have a new high-grade stenosis in the proximal LAD and of the graft supplying the diagonal vessel with the previously placed stent in the distal portion of the graft remaining widely open.  He also had 75% stenosis in the sequential limb of the vein graft between the OM1 and OM 2 vessel which was treated medically.  His LIMA to LAD was patent as was his SVG to RCA.  Since last intervention, Daryl Joseph has remained stable without chest pain or shortness of breath.  When last seen by me in August 2024 blood pressure was stable on his blood pressure/anti-ischemic medical regimen consisting of amlodipine  10 mg, benazepril  40 mg, HCTZ 12.5 mg 3 days a week, isosorbide  90 mg in the morning and 60 mg at night in addition to metoprolol  succinate 75 mg daily.  Presently, he has noticed some ankle edema and repeat blood pressure by me today was elevated.  He continues to be on amlodipine  10 mg, benazepril  40 mg isosorbide  90 in the morning 60 in the evening and metoprolol  succinate 75 mg and I have recommended he titrate hydrochlorothiazide  and take this daily.  This should help both his blood pressure as well as ankle  swelling.  He has continued to be on long-term DAPT but admits to significant easy bruisability.  As result I have recommended he discontinue aspirin  but continue Plavix  alone.  I am recommending follow-up comprehensive metabolic panel, CBC, fasting lipid studies as well as LP(a) assessment.  Depending upon laboratory results, since in the past he could not take statin therapy he may be a candidate for Repatha to take with his current Zetia  and fenofibrate  treatment.  He is diabetic on metformin  500 mg twice a day and takes pantoprazole  for GERD.  I reviewed his most recent echo Doppler study which continues to show normal LV function with EF 55 to 60%.  He does have at least mild aortic valve stenosis with a mean gradient of 11 and peak gradient of 21.2 but AVA estimate was 1.18 cm (VTI).  He is aware of my upcoming retirement.  I have recommended he have a repeat echo Doppler study in January 2026 and I will transition him to the care of Dr. Arnoldo Lapping for follow-up evaluation.  Millicent Ally, MD, FACC  12/21/2023 5:02 PM

## 2023-12-21 NOTE — Patient Instructions (Signed)
 Medication Instructions:  Take hydrochlorothiazide  (HYDRODIURIL ) 25 MG tablet DAILY STOP ASPIRIN  CONTINUE TO TAKE YOUR PLAVIX  *If you need a refill on your cardiac medications before your next appointment, please call your pharmacy*  Lab Work: FASTING LIPID, CMET, CBC, AND LP-a THIS WEEK OR NEXT WITH PRIMARY MD If you have labs (blood work) drawn today and your tests are completely normal, you will receive your results only by:  MyChart Message (if you have MyChart) OR A paper copy in the mail If you have any lab test that is abnormal or we need to change your treatment, we will call you to review the results.  Testing/Procedures: Your physician has requested that you have an echocardiogram-IN JANUARY 2026. Echocardiography is a painless test that uses sound waves to create images of your heart. It provides your doctor with information about the size and shape of your heart and how well your heart's chambers and valves are working. This procedure takes approximately one hour. There are no restrictions for this procedure. Please do NOT wear cologne, perfume, aftershave, or lotions (deodorant is allowed). Please arrive 15 minutes prior to your appointment time.  Please note: We ask at that you not bring children with you during ultrasound (echo/ vascular) testing. Due to room size and safety concerns, children are not allowed in the ultrasound rooms during exams. Our front office staff cannot provide observation of children in our lobby area while testing is being conducted. An adult accompanying a patient to their appointment will only be allowed in the ultrasound room at the discretion of the ultrasound technician under special circumstances. We apologize for any inconvenience.   Follow-Up: At [facility], you and your health needs are our priority.  As part of our continuing mission to provide you with exceptional heart care, our providers are all part of one team.  This team includes  your primary Cardiologist (physician) and Advanced Practice Providers or APPs (Physician Assistants and Nurse Practitioners) who all work together to provide you with the care you need, when you need it.  Your next appointment:   9 month(s) IN FEBRUARY 2026  Provider:   Magnus Schuller, MD -or- DR Arlester Ladd   We recommend signing up for the patient portal called "MyChart".  Sign up information is provided on this After Visit Summary.  MyChart is used to connect with patients for Virtual Visits (Telemedicine).  Patients are able to view lab/test results, encounter notes, upcoming appointments, etc.  Non-urgent messages can be sent to your provider as well.   To learn more about what you can do with MyChart, go to [email or web address].

## 2023-12-26 LAB — COMPREHENSIVE METABOLIC PANEL WITH GFR: EGFR: 66

## 2024-01-06 ENCOUNTER — Ambulatory Visit: Payer: Self-pay | Admitting: *Deleted

## 2024-02-10 ENCOUNTER — Other Ambulatory Visit: Payer: Self-pay | Admitting: Cardiovascular Disease

## 2024-03-08 ENCOUNTER — Other Ambulatory Visit: Payer: Self-pay

## 2024-03-08 MED ORDER — CLOPIDOGREL BISULFATE 75 MG PO TABS: 75.0000 mg | ORAL_TABLET | Freq: Every day | ORAL | 2 refills | Status: AC

## 2024-03-08 MED ORDER — ISOSORBIDE MONONITRATE ER 60 MG PO TB24
ORAL_TABLET | ORAL | 2 refills | Status: AC
Start: 2024-03-08 — End: ?

## 2024-03-25 ENCOUNTER — Telehealth: Payer: Self-pay | Admitting: Cardiovascular Disease

## 2024-03-25 MED ORDER — HYDROCHLOROTHIAZIDE 25 MG PO TABS: 25.0000 mg | ORAL_TABLET | Freq: Every day | ORAL | 2 refills | Status: AC

## 2024-03-25 NOTE — Telephone Encounter (Signed)
*  STAT* If patient is at the pharmacy, call can be transferred to refill team.   1. Which medications need to be refilled? (please list name of each medication and dose if known) hydrochlorothiazide  (HYDRODIURIL ) 25 MG tablet   2. Which pharmacy/location (including street and city if local pharmacy) is medication to be sent to?  [facility] [phone] - [place], [place] - [address]      3. Do they need a 30 day or 90 day supply? 90 day

## 2024-03-25 NOTE — Telephone Encounter (Signed)
 Pt's medication was sent to pt's pharmacy as requested. Confirmation received.

## 2024-04-26 ENCOUNTER — Other Ambulatory Visit: Payer: Self-pay

## 2024-04-26 MED ORDER — METOPROLOL SUCCINATE ER 50 MG PO TB24: 75.0000 mg | ORAL_TABLET | Freq: Every day | ORAL | 1 refills | Status: DC

## 2024-04-28 ENCOUNTER — Other Ambulatory Visit: Payer: Self-pay

## 2024-04-28 MED ORDER — FENOFIBRATE 160 MG PO TABS: 160.0000 mg | ORAL_TABLET | Freq: Every day | ORAL | 2 refills | Status: AC

## 2024-04-28 NOTE — Telephone Encounter (Signed)
 Please resend pt's medication fenofibrate  to pt's mail order pharmacy, because Dr. Joesphine name has cancelled all refills. Please address

## 2024-08-02 ENCOUNTER — Other Ambulatory Visit: Payer: Self-pay | Admitting: Student

## 2024-08-29 ENCOUNTER — Ambulatory Visit (HOSPITAL_COMMUNITY)

## 2024-09-26 ENCOUNTER — Ambulatory Visit (HOSPITAL_COMMUNITY)

## 2024-10-07 ENCOUNTER — Ambulatory Visit: Admitting: Cardiovascular Disease
